# Patient Record
Sex: Female | Born: 1940 | Race: White | Hispanic: No | Smoking: Never smoker
Health system: Southern US, Community
[De-identification: ages and names within clinical notes are randomized; demographics above are authoritative.]

## PROBLEM LIST (undated history)

## (undated) DIAGNOSIS — I639 Cerebral infarction, unspecified: Secondary | ICD-10-CM

## (undated) DIAGNOSIS — F0390 Unspecified dementia without behavioral disturbance: Secondary | ICD-10-CM

## (undated) DIAGNOSIS — J189 Pneumonia, unspecified organism: Secondary | ICD-10-CM

## (undated) DIAGNOSIS — E119 Type 2 diabetes mellitus without complications: Secondary | ICD-10-CM

## (undated) DIAGNOSIS — J90 Pleural effusion, not elsewhere classified: Secondary | ICD-10-CM

## (undated) DIAGNOSIS — K219 Gastro-esophageal reflux disease without esophagitis: Secondary | ICD-10-CM

## (undated) DIAGNOSIS — I509 Heart failure, unspecified: Secondary | ICD-10-CM

## (undated) DIAGNOSIS — I2510 Atherosclerotic heart disease of native coronary artery without angina pectoris: Secondary | ICD-10-CM

## (undated) DIAGNOSIS — IMO0002 Reserved for concepts with insufficient information to code with codable children: Secondary | ICD-10-CM

## (undated) DIAGNOSIS — I480 Paroxysmal atrial fibrillation: Secondary | ICD-10-CM

## (undated) DIAGNOSIS — I69351 Hemiplegia and hemiparesis following cerebral infarction affecting right dominant side: Secondary | ICD-10-CM

## (undated) DIAGNOSIS — J449 Chronic obstructive pulmonary disease, unspecified: Secondary | ICD-10-CM

## (undated) HISTORY — PX: THORACENTESIS: SHX235

## (undated) HISTORY — PX: BREAST SURGERY: SHX581

## (undated) HISTORY — PX: OTHER SURGICAL HISTORY: SHX169

---

## 2013-04-16 ENCOUNTER — Inpatient Hospital Stay
Admission: AD | Admit: 2013-04-16 | Discharge: 2013-05-25 | Disposition: A | Discharge status: Skilled Nursing Facility | Payer: Medicare Other | Source: Ambulatory Visit | Attending: Internal Medicine | Admitting: Internal Medicine

## 2013-04-17 ENCOUNTER — Other Ambulatory Visit (HOSPITAL_COMMUNITY): Payer: Medicare Other

## 2013-04-17 ENCOUNTER — Other Ambulatory Visit (HOSPITAL_COMMUNITY): Payer: Self-pay

## 2013-04-17 LAB — COMPREHENSIVE METABOLIC PANEL
AST: 20 U/L (ref 0–37)
Albumin: 1.9 g/dL — ABNORMAL LOW (ref 3.5–5.2)
BUN: 26 mg/dL — ABNORMAL HIGH (ref 6–23)
Calcium: 8.4 mg/dL (ref 8.4–10.5)
Chloride: 105 mEq/L (ref 96–112)
Creatinine, Ser: 1.82 mg/dL — ABNORMAL HIGH (ref 0.50–1.10)
GFR calc non Af Amer: 27 mL/min — ABNORMAL LOW (ref >90)
Sodium: 137 mEq/L (ref 135–145)

## 2013-04-17 LAB — PREALBUMIN: Prealbumin: 15.5 mg/dL — ABNORMAL LOW (ref 17.0–34.0)

## 2013-04-17 LAB — CBC
HCT: 31.5 % — ABNORMAL LOW (ref 36.0–46.0)
MCH: 24.6 pg — ABNORMAL LOW (ref 26.0–34.0)
MCHC: 31.4 g/dL (ref 30.0–36.0)
MCV: 78.4 fL (ref 78.0–100.0)
Platelets: 245 10*3/uL (ref 150–400)
RDW: 20.3 % — ABNORMAL HIGH (ref 11.5–15.5)
WBC: 9.7 10*3/uL (ref 4.0–10.5)

## 2013-04-17 LAB — CALCIUM, IONIZED: Calcium, Ion: 1.23 mmol/L (ref 1.13–1.30)

## 2013-04-17 LAB — MAGNESIUM: Magnesium: 2 mg/dL (ref 1.5–2.5)

## 2013-04-17 LAB — PHOSPHORUS: Phosphorus: 5.2 mg/dL — ABNORMAL HIGH (ref 2.3–4.6)

## 2013-04-17 LAB — PROCALCITONIN: Procalcitonin: <0.1 ng/mL

## 2013-04-18 ENCOUNTER — Other Ambulatory Visit (HOSPITAL_COMMUNITY): Payer: Medicare Other

## 2013-04-18 ENCOUNTER — Other Ambulatory Visit (HOSPITAL_COMMUNITY): Payer: Self-pay

## 2013-04-18 LAB — BLOOD GAS, ARTERIAL
Bicarbonate: 22.1 mEq/L (ref 20.0–24.0)
FIO2: 1 %
O2 Saturation: 99 %
TCO2: 23.4 mmol/L (ref 0–100)
pCO2 arterial: 43.9 mmHg (ref 35.0–45.0)
pH, Arterial: 7.322 — ABNORMAL LOW (ref 7.350–7.450)
pO2, Arterial: 150 mmHg — ABNORMAL HIGH (ref 80.0–100.0)

## 2013-04-18 NOTE — Procedures (Signed)
US guided therapeutic right thoracentesis performed yielding 1.8 liters slightly turbid, yellow fluid. F/u CXR pending. No immediate complications.

## 2013-04-19 ENCOUNTER — Institutional Professional Consult (permissible substitution) (HOSPITAL_COMMUNITY): Payer: Medicare Other

## 2013-04-19 ENCOUNTER — Encounter: Payer: Self-pay | Admitting: *Deleted

## 2013-04-19 ENCOUNTER — Other Ambulatory Visit (HOSPITAL_COMMUNITY): Payer: Medicare Other

## 2013-04-19 LAB — PROTIME-INR
INR: 1.15 (ref 0.00–1.49)
Prothrombin Time: 14.5 seconds (ref 11.6–15.2)

## 2013-04-19 LAB — BASIC METABOLIC PANEL
BUN: 24 mg/dL — ABNORMAL HIGH (ref 6–23)
CO2: 23 mEq/L (ref 19–32)
Chloride: 106 mEq/L (ref 96–112)
GFR calc Af Amer: 31 mL/min — ABNORMAL LOW (ref >90)
GFR calc non Af Amer: 26 mL/min — ABNORMAL LOW (ref >90)
Potassium: 5.3 mEq/L — ABNORMAL HIGH (ref 3.5–5.1)
Sodium: 138 mEq/L (ref 135–145)

## 2013-04-19 LAB — APTT: aPTT: 31 seconds (ref 24–37)

## 2013-04-19 LAB — CBC
HCT: 33.7 % — ABNORMAL LOW (ref 36.0–46.0)
Hemoglobin: 10.8 g/dL — ABNORMAL LOW (ref 12.0–15.0)
MCH: 25.1 pg — ABNORMAL LOW (ref 26.0–34.0)
MCHC: 32 g/dL (ref 30.0–36.0)
WBC: 13.8 10*3/uL — ABNORMAL HIGH (ref 4.0–10.5)

## 2013-04-20 ENCOUNTER — Other Ambulatory Visit (HOSPITAL_COMMUNITY): Payer: Medicare Other

## 2013-04-20 ENCOUNTER — Other Ambulatory Visit (HOSPITAL_COMMUNITY): Payer: Self-pay

## 2013-04-20 LAB — BASIC METABOLIC PANEL
BUN: 24 mg/dL — ABNORMAL HIGH (ref 6–23)
Calcium: 9 mg/dL (ref 8.4–10.5)
GFR calc Af Amer: 29 mL/min — ABNORMAL LOW (ref >90)
GFR calc non Af Amer: 25 mL/min — ABNORMAL LOW (ref >90)
Glucose, Bld: 143 mg/dL — ABNORMAL HIGH (ref 70–99)
Potassium: 4.8 mEq/L (ref 3.5–5.1)

## 2013-04-20 LAB — CBC
HCT: 33.4 % — ABNORMAL LOW (ref 36.0–46.0)
Hemoglobin: 10.6 g/dL — ABNORMAL LOW (ref 12.0–15.0)
MCH: 25.1 pg — ABNORMAL LOW (ref 26.0–34.0)
MCHC: 31.7 g/dL (ref 30.0–36.0)
Platelets: 219 10*3/uL (ref 150–400)
RDW: 20.8 % — ABNORMAL HIGH (ref 11.5–15.5)

## 2013-04-20 MED ORDER — MIDAZOLAM HCL 2 MG/2ML IJ SOLN
INTRAMUSCULAR | Status: AC
  Filled 2013-04-20: qty 6

## 2013-04-20 MED ORDER — FENTANYL CITRATE 0.05 MG/ML IJ SOLN
INTRAMUSCULAR | Status: AC
  Filled 2013-04-20: qty 4

## 2013-04-20 NOTE — Procedures (Signed)
Right Aspira tunneled pleural catheter placed under [place] and fluoro amber fluid removed No complication No blood loss. See complete dictation in [facility].

## 2013-04-20 NOTE — H&P (Signed)
Chief Complaint: "Shortness of breath."  Referring Physician: Dr. Sharyon Medicus  HPI: Ariana [name] is an 72 y.o. female with shortness of breath s/p right thoracentesis 1.8 liters 04/18/2013. Patient with chest CT 04/19/13 with right pleural fluid and CXR today increased fluid. Request for image guided right pleural catheter placement. She denies any chest pain, fever or chills. She admits to shortness of breath. She has been NPO and no blood thinners have been given. She is currently on pipercillin.   Past Medical History: CHF, renal disease, CAD, MI, DM, CVA  Past Surgical History: No pertinent surgeries  Family History: No family history on file.  Social History:  has no tobacco, alcohol, and drug history on file.  Allergies: See chart.  Medications:See chart.  Please HPI for pertinent positives, otherwise complete 10 system ROS negative.  Physical Exam: BP 117/77 There is no height or weight on file to calculate BMI.  General Appearance:  Alert, cooperative, no distress  Head:  Normocephalic, without obvious abnormality, atraumatic  Neck: Supple, symmetrical, trachea midline  Lungs:   Diminished breath sounds in right base, no w/r/r, respirations unlabored without use of accessory muscles.  Chest Wall:  No tenderness or deformity  Heart:  Regular rate and rhythm, S1, S2 normal, no murmur, rub or gallop.  Abdomen:   Soft, non-tender, non distended, (+) BS  Extremities: Extremities normal, atraumatic, no cyanosis or edema  Neurologic: Normal affect, no gross deficits.   Results for orders placed during the hospital encounter of 04/16/13 (from the past 48 hour(s))  BASIC METABOLIC PANEL     Status: Abnormal   Collection Time    04/19/13  4:14 AM      Result Value Range   Sodium 138  135 - 145 mEq/L   Potassium 5.3 (*) 3.5 - 5.1 mEq/L   Chloride 106  96 - 112 mEq/L   CO2 23  19 - 32 mEq/L   Glucose, Bld 110 (*) 70 - 99 mg/dL   BUN 24 (*) 6 - 23 mg/dL   Creatinine, Ser 4.09 (*)  0.50 - 1.10 mg/dL   Calcium 9.2  8.4 - 81.1 mg/dL   GFR calc non Af Amer 26 (*) >90 mL/min   GFR calc Af Amer 31 (*) >90 mL/min   Comment: (NOTE)     The eGFR has been calculated using the CKD EPI equation.     This calculation has not been validated in all clinical situations.     eGFR's persistently <90 mL/min signify possible Chronic Kidney     Disease.  CBC     Status: Abnormal   Collection Time    04/19/13  6:15 PM      Result Value Range   WBC 13.8 (*) 4.0 - 10.5 K/uL   RBC 4.30  3.87 - 5.11 MIL/uL   Hemoglobin 10.8 (*) 12.0 - 15.0 g/dL   HCT 91.4 (*) 78.2 - 95.6 %   MCV 78.4  78.0 - 100.0 fL   MCH 25.1 (*) 26.0 - 34.0 pg   MCHC 32.0  30.0 - 36.0 g/dL   RDW 21.3 (*) 08.6 - 57.8 %   Platelets 228  150 - 400 K/uL  APTT     Status: None   Collection Time    04/19/13  6:15 PM      Result Value Range   aPTT 31  24 - 37 seconds  PROTIME-INR     Status: None   Collection Time    04/19/13  6:15 PM  Result Value Range   Prothrombin Time 14.5  11.6 - 15.2 seconds   INR 1.15  0.00 - 1.49  CBC     Status: Abnormal   Collection Time    04/20/13  5:35 AM      Result Value Range   WBC 13.7 (*) 4.0 - 10.5 K/uL   RBC 4.23  3.87 - 5.11 MIL/uL   Hemoglobin 10.6 (*) 12.0 - 15.0 g/dL   HCT 98.1 (*) 19.1 - 47.8 %   MCV 79.0  78.0 - 100.0 fL   MCH 25.1 (*) 26.0 - 34.0 pg   MCHC 31.7  30.0 - 36.0 g/dL   RDW 29.5 (*) 62.1 - 30.8 %   Platelets 219  150 - 400 K/uL  BASIC METABOLIC PANEL     Status: Abnormal   Collection Time    04/20/13  5:35 AM      Result Value Range   Sodium 138  135 - 145 mEq/L   Potassium 4.8  3.5 - 5.1 mEq/L   Chloride 103  96 - 112 mEq/L   CO2 24  19 - 32 mEq/L   Glucose, Bld 143 (*) 70 - 99 mg/dL   BUN 24 (*) 6 - 23 mg/dL   Creatinine, Ser 6.57 (*) 0.50 - 1.10 mg/dL   Calcium 9.0  8.4 - 84.6 mg/dL   GFR calc non Af Amer 25 (*) >90 mL/min   GFR calc Af Amer 29 (*) >90 mL/min   Comment: (NOTE)     The eGFR has been calculated using the CKD EPI  equation.     This calculation has not been validated in all clinical situations.     eGFR's persistently <90 mL/min signify possible Chronic Kidney     Disease.  PROTIME-INR     Status: None   Collection Time    04/20/13  5:35 AM      Result Value Range   Prothrombin Time 14.8  11.6 - 15.2 seconds   INR 1.19  0.00 - 1.49   Ct Chest Wo Contrast  04/19/2013   CLINICAL DATA:  Right-sided parapneumonic effusion and status post drainage of 1.8 L of right pleural fluid yesterday by thoracentesis.  EXAM: CT CHEST WITHOUT CONTRAST  TECHNIQUE: Multidetector CT imaging of the chest was performed following the standard protocol without IV contrast.  COMPARISON:  Earlier chest x-rays including chest x-ray this morning.  FINDINGS: Unenhanced CT shows relatively small volume of re-accumulated right pleural fluid on the right after recent thoracentesis. There is no evidence of pneumothorax. Patchy infiltrate of the right upper lobe present. A tiny effusion is present on the left. None of the pleural fluid appears to be particularly loculated and there is no evidence of associated pleural thickening.  The heart is mildly enlarged. No pericardial fluid is seen. Calcified plaque is noted in the distribution of the LAD and left circumflex coronary arteries. No visualized enlarged lymph nodes. Bony structures are unremarkable.  IMPRESSION: Some degree of partial reaccumulation of right pleural fluid is suspected. Overall volume is relatively small, especially compared to the large volume drawn off yesterday. A tiny left pleural effusion is present. There is a right upper lobe infiltrate.   Electronically Signed   By: Irish Lack M.D.   On: 04/19/2013 12:46   Dg Chest Port 1 View  04/20/2013   CLINICAL DATA:  Pleural effusion.  EXAM: PORTABLE CHEST - 1 VIEW  COMPARISON:  04/19/2013  FINDINGS: There are persistent parenchymal densities throughout the  right lung which have not significantly changed. The right pleural  effusion may have increased in size. There is persistent consolidation at the left lung base. Cardiac silhouette is grossly stable.  IMPRESSION: There may be increased right pleural fluid.  Persistent consolidation at the left lung base.  Persistent parenchymal densities throughout the right lung.   Electronically Signed   By: Richarda Overlie M.D.   On: 04/20/2013 07:40   Dg Chest Port 1 View  04/19/2013   CLINICAL DATA:  Respiratory failure.  EXAM: PORTABLE CHEST - 1 VIEW  COMPARISON:  Chest x-ray 04/18/2013.  FINDINGS: Lung volumes are low. Persistent opacities throughout the mid lower left hemithorax compatible with areas of atelectasis and/or consolidation with superimposed moderate to large left pleural effusion. Increasing diffuse hazy opacity overlying right hemithorax may reflect enlarging layering right-sided pleural effusion, however, areas of increasing atelectasis and/or consolidation throughout the right mid to lower lung are also suspected. Cephalization of the pulmonary vasculature, without definite pulmonary edema. Heart size appears mildly enlarged The patient is rotated to the left on today's exam, resulting in distortion of the mediastinal contours and reduced diagnostic sensitivity and specificity for mediastinal pathology. Atherosclerosis in the thoracic aorta.  IMPRESSION: Overall, the appearance the chest suggests interval reaccumulation of a large volume of right-sided pleural fluid when compared to yesterday's examination. The appearance of the chest is otherwise essentially unchanged, as above.   Electronically Signed   By: Trudie Reed M.D.   On: 04/19/2013 07:45   Dg Chest Port 1 View  04/18/2013   CLINICAL DATA:  Post right thoracentesis.  EXAM: PORTABLE CHEST - 1 VIEW  COMPARISON:  04/18/2013  FINDINGS: Decreasing right pleural effusion following thoracentesis. No pneumothorax. Small left pleural effusion. No confluent opacities on the right. Continued left base atelectasis or  infiltrate. Mild cardiomegaly. No acute bony abnormality.  IMPRESSION: Decreasing right effusion following thoracentesis. No significant residual effusion. No pneumothorax.  Small left effusion with continued left base atelectasis or infiltrate, stable.   Electronically Signed   By: Charlett Nose M.D.   On: 04/18/2013 16:49   US Thoracentesis Asp Pleural Space W/img Guide  04/18/2013   CLINICAL DATA:  Patient with history of right chest tube placed at outside facility which inadvertently fell out, dyspnea, recurrent right pleural effusion. Request is made for therapeutic right thoracentesis  EXAM: ULTRASOUND GUIDED THERAPEUTIC RIGHT THORACENTESIS  COMPARISON:  None.  FINDINGS: A total of approximately 1.8 liters of slightly turbid, yellow fluid was removed.  IMPRESSION: Successful ultrasound guided therapeutic right thoracentesis yielding 1.8 liters of pleural fluid. Follow-up chest x-ray pending .  Read by: Jeananne Rama ,P.A.-C.  PROCEDURE: An ultrasound guided thoracentesis was thoroughly discussed with the patient/patient's family and questions answered. The benefits, risks, alternatives and complications were also discussed. The patient/patient's family understands and wishes to proceed with the procedure. Written consent was obtained.  Ultrasound was performed to localize and mark an adequate pocket of fluid in the right chest. The area was then prepped and draped in the normal sterile fashion. 1% Lidocaine was used for local anesthesia. Under ultrasound guidance a 19 gauge Yueh catheter was introduced. Thoracentesis was performed. The catheter was removed and a dressing applied.  Complications:  None immediate   Electronically Signed   By: Irish Lack M.D.   On: 04/18/2013 15:10    Assessment/Plan Right pleural effusion s/p thoracentesis 04/18/13 Shortness of breath, CT 11/23 and CXR 11/24 with increased right pleural fluid.  Request for image guided right pleural  pigtail catheter  placement. Patient has been NPO, no thinners, on IV antibiotics, images reviewed by Dr. Deanne Coffer and labs reviewed.  Risks and Benefits discussed with the patient and the patient's family over the phone. All of the patient's questions were answered, patient is agreeable to proceed. Consent signed and in chart.    Pattricia Boss D PA-C 04/20/2013, 12:21 PM

## 2013-04-20 NOTE — ED Notes (Signed)
No sedation given due to low O2 sats

## 2013-04-21 ENCOUNTER — Other Ambulatory Visit (HOSPITAL_COMMUNITY): Payer: Medicare Other

## 2013-04-21 LAB — CBC
MCH: 24.8 pg — ABNORMAL LOW (ref 26.0–34.0)
MCV: 80.9 fL (ref 78.0–100.0)
Platelets: 212 10*3/uL (ref 150–400)
RDW: 21.3 % — ABNORMAL HIGH (ref 11.5–15.5)
WBC: 14.8 10*3/uL — ABNORMAL HIGH (ref 4.0–10.5)

## 2013-04-21 LAB — BASIC METABOLIC PANEL
BUN: 26 mg/dL — ABNORMAL HIGH (ref 6–23)
Calcium: 8.8 mg/dL (ref 8.4–10.5)
Creatinine, Ser: 2.02 mg/dL — ABNORMAL HIGH (ref 0.50–1.10)
GFR calc Af Amer: 27 mL/min — ABNORMAL LOW (ref >90)
Glucose, Bld: 106 mg/dL — ABNORMAL HIGH (ref 70–99)

## 2013-04-23 LAB — BASIC METABOLIC PANEL
BUN: 28 mg/dL — ABNORMAL HIGH (ref 6–23)
Calcium: 8.5 mg/dL (ref 8.4–10.5)
GFR calc Af Amer: 30 mL/min — ABNORMAL LOW (ref >90)
GFR calc non Af Amer: 26 mL/min — ABNORMAL LOW (ref >90)
Glucose, Bld: 59 mg/dL — ABNORMAL LOW (ref 70–99)
Sodium: 139 mEq/L (ref 135–145)

## 2013-04-23 LAB — CBC
Hemoglobin: 10.3 g/dL — ABNORMAL LOW (ref 12.0–15.0)
MCH: 24.6 pg — ABNORMAL LOW (ref 26.0–34.0)
MCHC: 31 g/dL (ref 30.0–36.0)
MCV: 79.4 fL (ref 78.0–100.0)
Platelets: 214 10*3/uL (ref 150–400)
RDW: 21.5 % — ABNORMAL HIGH (ref 11.5–15.5)

## 2013-04-25 LAB — BASIC METABOLIC PANEL
Calcium: 8.8 mg/dL (ref 8.4–10.5)
Chloride: 105 mEq/L (ref 96–112)
Creatinine, Ser: 2 mg/dL — ABNORMAL HIGH (ref 0.50–1.10)
GFR calc Af Amer: 28 mL/min — ABNORMAL LOW (ref >90)
GFR calc non Af Amer: 24 mL/min — ABNORMAL LOW (ref >90)

## 2013-04-25 LAB — TSH: TSH: 1.977 u[IU]/mL (ref 0.350–4.500)

## 2013-04-26 ENCOUNTER — Encounter: Payer: Self-pay | Admitting: *Deleted

## 2013-04-26 ENCOUNTER — Other Ambulatory Visit (HOSPITAL_COMMUNITY): Payer: Medicare Other

## 2013-04-27 ENCOUNTER — Other Ambulatory Visit (HOSPITAL_COMMUNITY): Payer: Medicare Other

## 2013-04-27 LAB — BASIC METABOLIC PANEL
BUN: 25 mg/dL — ABNORMAL HIGH (ref 6–23)
CO2: 25 mEq/L (ref 19–32)
Chloride: 104 mEq/L (ref 96–112)
Creatinine, Ser: 2.04 mg/dL — ABNORMAL HIGH (ref 0.50–1.10)
GFR calc Af Amer: 27 mL/min — ABNORMAL LOW (ref >90)
Potassium: 5.7 mEq/L — ABNORMAL HIGH (ref 3.5–5.1)
Sodium: 138 mEq/L (ref 135–145)

## 2013-04-27 LAB — CBC
Hemoglobin: 9.8 g/dL — ABNORMAL LOW (ref 12.0–15.0)
MCV: 79.5 fL (ref 78.0–100.0)
Platelets: 183 10*3/uL (ref 150–400)
RBC: 3.86 MIL/uL — ABNORMAL LOW (ref 3.87–5.11)
RDW: 22.2 % — ABNORMAL HIGH (ref 11.5–15.5)
WBC: 6.6 10*3/uL (ref 4.0–10.5)

## 2013-04-27 LAB — POTASSIUM: Potassium: 4.4 mEq/L (ref 3.5–5.1)

## 2013-04-28 LAB — APTT: aPTT: 29 seconds (ref 24–37)

## 2013-04-28 LAB — PROTIME-INR: INR: 1.34 (ref 0.00–1.49)

## 2013-04-30 ENCOUNTER — Other Ambulatory Visit (HOSPITAL_COMMUNITY): Payer: Medicare Other

## 2013-04-30 LAB — BASIC METABOLIC PANEL
BUN: 23 mg/dL (ref 6–23)
Calcium: 8.3 mg/dL — ABNORMAL LOW (ref 8.4–10.5)
Chloride: 105 mEq/L (ref 96–112)
Creatinine, Ser: 1.85 mg/dL — ABNORMAL HIGH (ref 0.50–1.10)
GFR calc Af Amer: 30 mL/min — ABNORMAL LOW (ref >90)
GFR calc non Af Amer: 26 mL/min — ABNORMAL LOW (ref >90)
Glucose, Bld: 94 mg/dL (ref 70–99)
Potassium: 5.8 mEq/L — ABNORMAL HIGH (ref 3.5–5.1)

## 2013-04-30 LAB — PROTIME-INR
INR: 5.63 (ref 0.00–1.49)
Prothrombin Time: 48.6 seconds — ABNORMAL HIGH (ref 11.6–15.2)

## 2013-04-30 LAB — CBC
HCT: 33.4 % — ABNORMAL LOW (ref 36.0–46.0)
Hemoglobin: 10.5 g/dL — ABNORMAL LOW (ref 12.0–15.0)
MCHC: 31.4 g/dL (ref 30.0–36.0)
RBC: 4.14 MIL/uL (ref 3.87–5.11)
RDW: 21.9 % — ABNORMAL HIGH (ref 11.5–15.5)

## 2013-05-01 LAB — PROTIME-INR: Prothrombin Time: 42.3 seconds — ABNORMAL HIGH (ref 11.6–15.2)

## 2013-05-02 ENCOUNTER — Other Ambulatory Visit (HOSPITAL_COMMUNITY): Payer: Medicare Other

## 2013-05-02 LAB — PROTIME-INR
INR: 3.4 — ABNORMAL HIGH (ref 0.00–1.49)
Prothrombin Time: 33.1 seconds — ABNORMAL HIGH (ref 11.6–15.2)

## 2013-05-03 ENCOUNTER — Other Ambulatory Visit (HOSPITAL_COMMUNITY): Payer: Medicare Other

## 2013-05-03 NOTE — H&P (Signed)
Ariana [name] is an 72 y.o. female.   Chief Complaint: Scheduled for replacement of Rt pleural aspira drain 12/8 Drain was originally placed 11/24 Did well with good output until 11/30 Pt apparently developed pain and decreased output and drain was removed at  bedside by [facility] MD 11/30 Pt continues to have worsening sxs of shortness of breath CXR today shows moderate R effusion; small left per Dr Archer [facility] talked with Dr Sharyon Medicus- now scheduled for replacement 12/8 INR 2.17 today--recheck in am 100% O2 sat- 4 liters Resp : 32 If pt worsens today we will do thora now; and reschedule aspira drain later in week when effusion has reaccumulated HPI: CHF; Rt pleural effusion- worsening  History reviewed. No pertinent past medical history.  No past surgical history on file.  No family history on file. Social History:  has no tobacco, alcohol, and drug history on file.  Allergies: Not on File  No prescriptions prior to admission    Results for orders placed during the hospital encounter of 04/16/13 (from the past 48 hour(s))  PROTIME-INR     Status: Abnormal   Collection Time    05/02/13  5:20 AM      Result Value Range   Prothrombin Time 33.1 (*) 11.6 - 15.2 seconds   INR 3.40 (*) 0.00 - 1.49  PROTIME-INR     Status: Abnormal   Collection Time    05/03/13  6:18 AM      Result Value Range   Prothrombin Time 23.5 (*) 11.6 - 15.2 seconds   INR 2.17 (*) 0.00 - 1.49   Dg Chest Port 1 View  05/03/2013   CLINICAL DATA:  Dyspnea, known pleural effusions.  EXAM: PORTABLE CHEST - 1 VIEW  COMPARISON:  Portable chest x-ray of May 02, 2013.  FINDINGS: The lungs appear slightly better inflated today. There is blunting of the costophrenic angles and bilaterally and there is fluid in the minor fissure consistent with stable pleural effusions. The cardiopericardial silhouette remains enlarged. The pulmonary interstitial markings remain mildly increased but appear to haveimproved  slightly since the earlier study. The PICC line on the left appears unchanged with the tip at the junction of the right and left brachiocephalic veins.  IMPRESSION: 1. The findings are consistent with congestive heart failure with pulmonary interstitial edema and bilateral pleural effusions. There may been slight interval improvement in the interstitial edema since yesterday's study.   Electronically Signed   By: David  [place]   On: 05/03/2013 08:42   Dg Chest Port 1 View  05/02/2013   CLINICAL DATA:  Pleural effusions.  EXAM: PORTABLE CHEST - 1 VIEW  COMPARISON:  Chest x-rays dated 04/30/2013 and 04/27/2013  FINDINGS: PICC in place, unchanged. Persistent cardiomegaly. Pulmonary vascularity is normal.  The pulmonary edema seen on the prior study has improved. Right pleural effusion appears slightly larger. Loculated left pleural effusion appears slightly diminished.  IMPRESSION: 1. Improved bilateral pulmonary edema. 2. Increased loculated right pleural effusion. 3. Slight improvement of loculated effusion at the left base.   Electronically Signed   By: Geanie Cooley M.D.   On: 05/02/2013 08:20    Review of Systems  Constitutional: Positive for weight loss. Negative for fever.  Respiratory: Positive for shortness of breath and wheezing.   Cardiovascular: Positive for chest pain and orthopnea.  Gastrointestinal: Negative for nausea, vomiting and abdominal pain.  Neurological: Positive for weakness.    Blood pressure 120/75, pulse 96, resp. rate 24, SpO2 96.00%. Physical Exam  Constitutional:  She is oriented to person, place, and time. She appears well-nourished.  Cardiovascular: Normal rate, regular rhythm and normal heart sounds.   Respiratory: She is in respiratory distress. She has wheezes.  O2 sat 100% 4L Resp: 32  Neurological: She is alert and oriented to person, place, and time.  Very weak  Skin: Skin is warm.  Psychiatric: She has a normal mood and affect. Her behavior is normal.   Consented son via phone     Assessment/Plan Worsening Rt pleural effusion Worsening sob Scheduled for replacement of pleural aspira drain (R) 12/8 Check INR 12/8 am If worsens today or tonight - please call IR MD  Pt and son aware of procedure benefits and risks and agreeable to proceed Consent signed andin chart Ancef ordered  Casaundra Takacs A 05/03/2013, 10:31 AM

## 2013-05-04 LAB — BASIC METABOLIC PANEL
BUN: 20 mg/dL (ref 6–23)
CO2: 28 mEq/L (ref 19–32)
Creatinine, Ser: 1.84 mg/dL — ABNORMAL HIGH (ref 0.50–1.10)
GFR calc non Af Amer: 26 mL/min — ABNORMAL LOW (ref >90)
Glucose, Bld: 100 mg/dL — ABNORMAL HIGH (ref 70–99)
Potassium: 3.7 mEq/L (ref 3.5–5.1)

## 2013-05-04 LAB — CBC
HCT: 32.2 % — ABNORMAL LOW (ref 36.0–46.0)
Hemoglobin: 9.9 g/dL — ABNORMAL LOW (ref 12.0–15.0)
MCHC: 30.7 g/dL (ref 30.0–36.0)
Platelets: 234 10*3/uL (ref 150–400)
RBC: 3.88 MIL/uL (ref 3.87–5.11)

## 2013-05-04 LAB — PROTIME-INR: INR: 1.59 — ABNORMAL HIGH (ref 0.00–1.49)

## 2013-05-04 NOTE — Progress Notes (Signed)
Dr. Fredia Sorrow has reviewed the patient's recent CXR and feels given that the patient has not had a pleural drain in place for over a week with no need for a thoracentesis, we will hold off on a pleural drain placement for now and perform therapeutic thoracentesis when needed. This was discussed with Dr. Arnette Norris today who also feels that the patient's CXR from 05/03/13 has improved.  Pattricia Boss PA-C Interventional Radiology  05/04/13  11:18 AM

## 2013-05-05 ENCOUNTER — Other Ambulatory Visit (HOSPITAL_COMMUNITY): Payer: Medicare Other

## 2013-05-05 LAB — PROTIME-INR: INR: 1.49 (ref 0.00–1.49)

## 2013-05-06 LAB — BASIC METABOLIC PANEL
BUN: 23 mg/dL (ref 6–23)
CO2: 28 mEq/L (ref 19–32)
Calcium: 8.5 mg/dL (ref 8.4–10.5)
Chloride: 104 mEq/L (ref 96–112)
Creatinine, Ser: 1.97 mg/dL — ABNORMAL HIGH (ref 0.50–1.10)
GFR calc Af Amer: 28 mL/min — ABNORMAL LOW (ref >90)
GFR calc non Af Amer: 24 mL/min — ABNORMAL LOW (ref >90)
Glucose, Bld: 72 mg/dL (ref 70–99)
Potassium: 3.9 mEq/L (ref 3.5–5.1)
Sodium: 141 mEq/L (ref 135–145)

## 2013-05-06 LAB — CBC
HCT: 34.3 % — ABNORMAL LOW (ref 36.0–46.0)
Hemoglobin: 10 g/dL — ABNORMAL LOW (ref 12.0–15.0)
MCHC: 29.2 g/dL — ABNORMAL LOW (ref 30.0–36.0)
RBC: 4.02 MIL/uL (ref 3.87–5.11)
WBC: 7.8 10*3/uL (ref 4.0–10.5)

## 2013-05-06 LAB — PROTIME-INR
INR: 2.94 — ABNORMAL HIGH (ref 0.00–1.49)
Prothrombin Time: 29.6 seconds — ABNORMAL HIGH (ref 11.6–15.2)

## 2013-05-06 LAB — PRO B NATRIURETIC PEPTIDE: Pro B Natriuretic peptide (BNP): 11763 pg/mL — ABNORMAL HIGH (ref 0–125)

## 2013-05-07 LAB — BASIC METABOLIC PANEL
BUN: 23 mg/dL (ref 6–23)
CO2: 30 mEq/L (ref 19–32)
Calcium: 8.5 mg/dL (ref 8.4–10.5)
Creatinine, Ser: 1.88 mg/dL — ABNORMAL HIGH (ref 0.50–1.10)
Glucose, Bld: 83 mg/dL (ref 70–99)

## 2013-05-07 LAB — PROTIME-INR: INR: 3.65 — ABNORMAL HIGH (ref 0.00–1.49)

## 2013-05-08 ENCOUNTER — Institutional Professional Consult (permissible substitution) (HOSPITAL_COMMUNITY): Payer: Medicare Other

## 2013-05-08 DIAGNOSIS — I359 Nonrheumatic aortic valve disorder, unspecified: Secondary | ICD-10-CM

## 2013-05-08 LAB — CBC WITH DIFFERENTIAL/PLATELET
Basophils Relative: 0 % (ref 0–1)
Eosinophils Absolute: 0.3 10*3/uL (ref 0.0–0.7)
HCT: 35.9 % — ABNORMAL LOW (ref 36.0–46.0)
Hemoglobin: 10.8 g/dL — ABNORMAL LOW (ref 12.0–15.0)
Lymphocytes Relative: 9 % — ABNORMAL LOW (ref 12–46)
Lymphs Abs: 0.8 10*3/uL (ref 0.7–4.0)
MCH: 25.1 pg — ABNORMAL LOW (ref 26.0–34.0)
MCHC: 30.1 g/dL (ref 30.0–36.0)
MCV: 83.5 fL (ref 78.0–100.0)
Monocytes Absolute: 0.8 10*3/uL (ref 0.1–1.0)
Neutro Abs: 7.4 10*3/uL (ref 1.7–7.7)
WBC: 9.3 10*3/uL (ref 4.0–10.5)

## 2013-05-08 LAB — BASIC METABOLIC PANEL
BUN: 24 mg/dL — ABNORMAL HIGH (ref 6–23)
GFR calc Af Amer: 30 mL/min — ABNORMAL LOW (ref >90)
GFR calc non Af Amer: 26 mL/min — ABNORMAL LOW (ref >90)
Glucose, Bld: 126 mg/dL — ABNORMAL HIGH (ref 70–99)
Potassium: 5 mEq/L (ref 3.5–5.1)

## 2013-05-08 NOTE — Progress Notes (Signed)
  Echocardiogram 2D Echocardiogram has been performed.  Cathie Beams 05/08/2013, 5:04 PM

## 2013-05-09 LAB — PROTIME-INR: Prothrombin Time: 22.5 seconds — ABNORMAL HIGH (ref 11.6–15.2)

## 2013-05-10 LAB — URINALYSIS, ROUTINE W REFLEX MICROSCOPIC
Glucose, UA: NEGATIVE mg/dL
Leukocytes, UA: NEGATIVE
Nitrite: NEGATIVE
Protein, ur: >300 mg/dL — AB
Specific Gravity, Urine: 1.011 (ref 1.005–1.030)
pH: 6.5 (ref 5.0–8.0)

## 2013-05-10 LAB — URINE MICROSCOPIC-ADD ON

## 2013-05-10 LAB — PROTIME-INR: Prothrombin Time: 23.3 seconds — ABNORMAL HIGH (ref 11.6–15.2)

## 2013-05-11 ENCOUNTER — Other Ambulatory Visit (HOSPITAL_COMMUNITY): Payer: Medicare Other

## 2013-05-11 LAB — BASIC METABOLIC PANEL
BUN: 30 mg/dL — ABNORMAL HIGH (ref 6–23)
CO2: 28 mEq/L (ref 19–32)
Calcium: 8.6 mg/dL (ref 8.4–10.5)
Chloride: 101 mEq/L (ref 96–112)
GFR calc Af Amer: 27 mL/min — ABNORMAL LOW (ref >90)
GFR calc non Af Amer: 23 mL/min — ABNORMAL LOW (ref >90)
Potassium: 3.8 mEq/L (ref 3.5–5.1)
Sodium: 140 mEq/L (ref 135–145)

## 2013-05-11 LAB — PROTIME-INR: Prothrombin Time: 22.5 seconds — ABNORMAL HIGH (ref 11.6–15.2)

## 2013-05-12 LAB — CBC
Hemoglobin: 15.1 g/dL — ABNORMAL HIGH (ref 12.0–15.0)
MCHC: 30.9 g/dL (ref 30.0–36.0)
Platelets: 226 10*3/uL (ref 150–400)
RDW: 21.4 % — ABNORMAL HIGH (ref 11.5–15.5)

## 2013-05-12 LAB — BASIC METABOLIC PANEL
Calcium: 8.6 mg/dL (ref 8.4–10.5)
GFR calc Af Amer: 27 mL/min — ABNORMAL LOW (ref >90)
GFR calc non Af Amer: 23 mL/min — ABNORMAL LOW (ref >90)
Glucose, Bld: 110 mg/dL — ABNORMAL HIGH (ref 70–99)
Potassium: 4.6 mEq/L (ref 3.5–5.1)
Sodium: 135 mEq/L (ref 135–145)

## 2013-05-12 LAB — METHYLMALONIC ACID, SERUM: Methylmalonic Acid, Quantitative: 0.53 umol/L — ABNORMAL HIGH (ref <0.40)

## 2013-05-12 LAB — PROTIME-INR
INR: 2.2 — ABNORMAL HIGH (ref 0.00–1.49)
Prothrombin Time: 23.7 seconds — ABNORMAL HIGH (ref 11.6–15.2)

## 2013-05-13 LAB — PROTIME-INR
INR: 2.38 — ABNORMAL HIGH (ref 0.00–1.49)
Prothrombin Time: 25.2 seconds — ABNORMAL HIGH (ref 11.6–15.2)

## 2013-05-13 LAB — BASIC METABOLIC PANEL
CO2: 24 mEq/L (ref 19–32)
Calcium: 8.6 mg/dL (ref 8.4–10.5)
Creatinine, Ser: 1.94 mg/dL — ABNORMAL HIGH (ref 0.50–1.10)
GFR calc non Af Amer: 25 mL/min — ABNORMAL LOW (ref >90)
Potassium: 4.8 mEq/L (ref 3.5–5.1)
Sodium: 136 mEq/L (ref 135–145)

## 2013-05-14 LAB — CBC
MCH: 25.1 pg — ABNORMAL LOW (ref 26.0–34.0)
MCHC: 29.4 g/dL — ABNORMAL LOW (ref 30.0–36.0)
RBC: 4.03 MIL/uL (ref 3.87–5.11)
RDW: 20.6 % — ABNORMAL HIGH (ref 11.5–15.5)

## 2013-05-14 LAB — PROTIME-INR
INR: 1.98 — ABNORMAL HIGH (ref 0.00–1.49)
Prothrombin Time: 21.9 seconds — ABNORMAL HIGH (ref 11.6–15.2)

## 2013-05-15 ENCOUNTER — Other Ambulatory Visit (HOSPITAL_COMMUNITY): Payer: Medicare Other

## 2013-05-15 LAB — BASIC METABOLIC PANEL
BUN: 33 mg/dL — ABNORMAL HIGH (ref 6–23)
CO2: 26 mEq/L (ref 19–32)
Calcium: 8.4 mg/dL (ref 8.4–10.5)
GFR calc Af Amer: 26 mL/min — ABNORMAL LOW (ref >90)
GFR calc non Af Amer: 22 mL/min — ABNORMAL LOW (ref >90)
Glucose, Bld: 100 mg/dL — ABNORMAL HIGH (ref 70–99)
Potassium: 4.7 mEq/L (ref 3.5–5.1)
Sodium: 136 mEq/L (ref 135–145)

## 2013-05-15 LAB — URINE CULTURE

## 2013-05-15 LAB — PROTIME-INR: INR: 1.52 — ABNORMAL HIGH (ref 0.00–1.49)

## 2013-05-16 LAB — PROTIME-INR: Prothrombin Time: 17.7 seconds — ABNORMAL HIGH (ref 11.6–15.2)

## 2013-05-17 LAB — PROTIME-INR: Prothrombin Time: 20.4 seconds — ABNORMAL HIGH (ref 11.6–15.2)

## 2013-05-18 ENCOUNTER — Other Ambulatory Visit (HOSPITAL_COMMUNITY): Payer: Medicare Other

## 2013-05-18 LAB — BASIC METABOLIC PANEL
BUN: 27 mg/dL — ABNORMAL HIGH (ref 6–23)
GFR calc Af Amer: 26 mL/min — ABNORMAL LOW (ref >90)
GFR calc non Af Amer: 23 mL/min — ABNORMAL LOW (ref >90)
Potassium: 4.6 mEq/L (ref 3.5–5.1)
Sodium: 137 mEq/L (ref 135–145)

## 2013-05-18 LAB — CBC
MCHC: 30.8 g/dL (ref 30.0–36.0)
RBC: 3.9 MIL/uL (ref 3.87–5.11)
RDW: 20.6 % — ABNORMAL HIGH (ref 11.5–15.5)

## 2013-05-18 LAB — PROTIME-INR
INR: 2.83 — ABNORMAL HIGH (ref 0.00–1.49)
Prothrombin Time: 28.8 seconds — ABNORMAL HIGH (ref 11.6–15.2)

## 2013-05-19 LAB — URINE MICROSCOPIC-ADD ON

## 2013-05-19 LAB — URINALYSIS, ROUTINE W REFLEX MICROSCOPIC
Bilirubin Urine: NEGATIVE
Glucose, UA: 100 mg/dL — AB
Ketones, ur: NEGATIVE mg/dL
Protein, ur: >300 mg/dL — AB
Urobilinogen, UA: 0.2 mg/dL (ref 0.0–1.0)

## 2013-05-19 LAB — PROTIME-INR
INR: 3.47 — ABNORMAL HIGH (ref 0.00–1.49)
Prothrombin Time: 33.6 seconds — ABNORMAL HIGH (ref 11.6–15.2)

## 2013-05-20 ENCOUNTER — Other Ambulatory Visit (HOSPITAL_COMMUNITY): Payer: Medicare Other

## 2013-05-20 LAB — CBC
HCT: 32.7 % — ABNORMAL LOW (ref 36.0–46.0)
MCH: 26 pg (ref 26.0–34.0)
MCHC: 31.2 g/dL (ref 30.0–36.0)
RDW: 20.9 % — ABNORMAL HIGH (ref 11.5–15.5)
WBC: 6.7 10*3/uL (ref 4.0–10.5)

## 2013-05-20 LAB — BASIC METABOLIC PANEL
BUN: 26 mg/dL — ABNORMAL HIGH (ref 6–23)
Calcium: 8.4 mg/dL (ref 8.4–10.5)
Chloride: 107 mEq/L (ref 96–112)
Creatinine, Ser: 2.16 mg/dL — ABNORMAL HIGH (ref 0.50–1.10)
GFR calc Af Amer: 25 mL/min — ABNORMAL LOW (ref >90)
Glucose, Bld: 91 mg/dL (ref 70–99)

## 2013-05-20 LAB — PROTIME-INR: Prothrombin Time: 28.2 seconds — ABNORMAL HIGH (ref 11.6–15.2)

## 2013-05-23 LAB — PROTIME-INR: INR: 1.69 — ABNORMAL HIGH (ref 0.00–1.49)

## 2013-05-24 LAB — CBC
MCH: 25.9 pg — ABNORMAL LOW (ref 26.0–34.0)
MCV: 83 fL (ref 78.0–100.0)
Platelets: 176 10*3/uL (ref 150–400)
RBC: 3.82 MIL/uL — ABNORMAL LOW (ref 3.87–5.11)
RDW: 20.5 % — ABNORMAL HIGH (ref 11.5–15.5)
WBC: 6.7 10*3/uL (ref 4.0–10.5)

## 2013-05-24 LAB — BASIC METABOLIC PANEL
Calcium: 8.2 mg/dL — ABNORMAL LOW (ref 8.4–10.5)
Creatinine, Ser: 1.88 mg/dL — ABNORMAL HIGH (ref 0.50–1.10)
GFR calc non Af Amer: 26 mL/min — ABNORMAL LOW (ref >90)
Glucose, Bld: 107 mg/dL — ABNORMAL HIGH (ref 70–99)
Sodium: 135 mEq/L (ref 135–145)

## 2013-05-24 LAB — PROTIME-INR: INR: 1.83 — ABNORMAL HIGH (ref 0.00–1.49)

## 2013-05-25 LAB — PROTIME-INR: Prothrombin Time: 23 seconds — ABNORMAL HIGH (ref 11.6–15.2)

## 2013-05-30 ENCOUNTER — Encounter (HOSPITAL_COMMUNITY): Payer: Self-pay | Admitting: Emergency Medicine

## 2013-05-30 ENCOUNTER — Inpatient Hospital Stay (HOSPITAL_COMMUNITY): Payer: Medicare Other

## 2013-05-30 ENCOUNTER — Inpatient Hospital Stay (HOSPITAL_COMMUNITY)
Admission: EM | Admit: 2013-05-30 | Discharge: 2013-06-03 | DRG: 291 | Disposition: A | Discharge status: Skilled Nursing Facility | Payer: Medicare Other | Attending: Internal Medicine | Admitting: Internal Medicine

## 2013-05-30 ENCOUNTER — Emergency Department (HOSPITAL_COMMUNITY): Payer: Medicare Other

## 2013-05-30 DIAGNOSIS — Z7901 Long term (current) use of anticoagulants: Secondary | ICD-10-CM

## 2013-05-30 DIAGNOSIS — Z7982 Long term (current) use of aspirin: Secondary | ICD-10-CM

## 2013-05-30 DIAGNOSIS — F0390 Unspecified dementia without behavioral disturbance: Secondary | ICD-10-CM

## 2013-05-30 DIAGNOSIS — J90 Pleural effusion, not elsewhere classified: Secondary | ICD-10-CM

## 2013-05-30 DIAGNOSIS — I69959 Hemiplegia and hemiparesis following unspecified cerebrovascular disease affecting unspecified side: Secondary | ICD-10-CM

## 2013-05-30 DIAGNOSIS — I5031 Acute diastolic (congestive) heart failure: Secondary | ICD-10-CM

## 2013-05-30 DIAGNOSIS — R0600 Dyspnea, unspecified: Secondary | ICD-10-CM | POA: Diagnosis present

## 2013-05-30 DIAGNOSIS — I509 Heart failure, unspecified: Secondary | ICD-10-CM | POA: Diagnosis present

## 2013-05-30 DIAGNOSIS — J449 Chronic obstructive pulmonary disease, unspecified: Secondary | ICD-10-CM | POA: Diagnosis present

## 2013-05-30 DIAGNOSIS — D649 Anemia, unspecified: Secondary | ICD-10-CM | POA: Diagnosis present

## 2013-05-30 DIAGNOSIS — N183 Chronic kidney disease, stage 3 unspecified: Secondary | ICD-10-CM | POA: Diagnosis present

## 2013-05-30 DIAGNOSIS — J9600 Acute respiratory failure, unspecified whether with hypoxia or hypercapnia: Secondary | ICD-10-CM

## 2013-05-30 DIAGNOSIS — I2510 Atherosclerotic heart disease of native coronary artery without angina pectoris: Secondary | ICD-10-CM | POA: Diagnosis present

## 2013-05-30 DIAGNOSIS — I69351 Hemiplegia and hemiparesis following cerebral infarction affecting right dominant side: Secondary | ICD-10-CM | POA: Insufficient documentation

## 2013-05-30 DIAGNOSIS — J4489 Other specified chronic obstructive pulmonary disease: Secondary | ICD-10-CM | POA: Diagnosis present

## 2013-05-30 DIAGNOSIS — IMO0002 Reserved for concepts with insufficient information to code with codable children: Secondary | ICD-10-CM | POA: Insufficient documentation

## 2013-05-30 DIAGNOSIS — J9601 Acute respiratory failure with hypoxia: Secondary | ICD-10-CM | POA: Diagnosis present

## 2013-05-30 DIAGNOSIS — K219 Gastro-esophageal reflux disease without esophagitis: Secondary | ICD-10-CM | POA: Diagnosis present

## 2013-05-30 DIAGNOSIS — Z794 Long term (current) use of insulin: Secondary | ICD-10-CM

## 2013-05-30 DIAGNOSIS — I4891 Unspecified atrial fibrillation: Secondary | ICD-10-CM | POA: Diagnosis present

## 2013-05-30 DIAGNOSIS — J9620 Acute and chronic respiratory failure, unspecified whether with hypoxia or hypercapnia: Secondary | ICD-10-CM | POA: Diagnosis present

## 2013-05-30 DIAGNOSIS — J189 Pneumonia, unspecified organism: Secondary | ICD-10-CM | POA: Insufficient documentation

## 2013-05-30 DIAGNOSIS — I5033 Acute on chronic diastolic (congestive) heart failure: Principal | ICD-10-CM | POA: Diagnosis present

## 2013-05-30 DIAGNOSIS — E119 Type 2 diabetes mellitus without complications: Secondary | ICD-10-CM | POA: Diagnosis present

## 2013-05-30 DIAGNOSIS — Z22322 Carrier or suspected carrier of Methicillin resistant Staphylococcus aureus: Secondary | ICD-10-CM

## 2013-05-30 DIAGNOSIS — I480 Paroxysmal atrial fibrillation: Secondary | ICD-10-CM

## 2013-05-30 DIAGNOSIS — Z9981 Dependence on supplemental oxygen: Secondary | ICD-10-CM

## 2013-05-30 DIAGNOSIS — Z23 Encounter for immunization: Secondary | ICD-10-CM

## 2013-05-30 DIAGNOSIS — Z79899 Other long term (current) drug therapy: Secondary | ICD-10-CM

## 2013-05-30 DIAGNOSIS — I69922 Dysarthria following unspecified cerebrovascular disease: Secondary | ICD-10-CM

## 2013-05-30 HISTORY — DX: Paroxysmal atrial fibrillation: I48.0

## 2013-05-30 HISTORY — DX: Atherosclerotic heart disease of native coronary artery without angina pectoris: I25.10

## 2013-05-30 HISTORY — DX: Gastro-esophageal reflux disease without esophagitis: K21.9

## 2013-05-30 HISTORY — DX: Unspecified dementia, unspecified severity, without behavioral disturbance, psychotic disturbance, mood disturbance, and anxiety: F03.90

## 2013-05-30 HISTORY — DX: Heart failure, unspecified: I50.9

## 2013-05-30 HISTORY — DX: Hemiplegia and hemiparesis following cerebral infarction affecting right dominant side: I69.351

## 2013-05-30 HISTORY — DX: Type 2 diabetes mellitus without complications: E11.9

## 2013-05-30 HISTORY — DX: Pleural effusion, not elsewhere classified: J90

## 2013-05-30 HISTORY — DX: Chronic obstructive pulmonary disease, unspecified: J44.9

## 2013-05-30 HISTORY — DX: Reserved for concepts with insufficient information to code with codable children: IMO0002

## 2013-05-30 HISTORY — DX: Pneumonia, unspecified organism: J18.9

## 2013-05-30 HISTORY — DX: Cerebral infarction, unspecified: I63.9

## 2013-05-30 LAB — POCT I-STAT 3, ART BLOOD GAS (G3+)
Acid-base deficit: 6 mmol/L — ABNORMAL HIGH (ref 0.0–2.0)
Bicarbonate: 19.3 mEq/L — ABNORMAL LOW (ref 20.0–24.0)
O2 Saturation: 96 %
Patient temperature: 97.3
TCO2: 20 mmol/L (ref 0–100)
pCO2 arterial: 35.4 mmHg (ref 35.0–45.0)
pH, Arterial: 7.341 — ABNORMAL LOW (ref 7.350–7.450)
pO2, Arterial: 83 mmHg (ref 80.0–100.0)

## 2013-05-30 LAB — HEPATIC FUNCTION PANEL
ALBUMIN: 2.2 g/dL — AB (ref 3.5–5.2)
ALK PHOS: 73 U/L (ref 39–117)
ALT: 17 U/L (ref 0–35)
AST: 32 U/L (ref 0–37)
BILIRUBIN TOTAL: 0.3 mg/dL (ref 0.3–1.2)
Bilirubin, Direct: <0.2 mg/dL (ref 0.0–0.3)
Total Protein: 7.4 g/dL (ref 6.0–8.3)

## 2013-05-30 LAB — TROPONIN I
Troponin I: <0.3 ng/mL (ref <0.30)
Troponin I: <0.3 ng/mL (ref <0.30)

## 2013-05-30 LAB — BASIC METABOLIC PANEL
BUN: 22 mg/dL (ref 6–23)
CO2: 20 mEq/L (ref 19–32)
Calcium: 8.5 mg/dL (ref 8.4–10.5)
Chloride: 105 mEq/L (ref 96–112)
Creatinine, Ser: 1.66 mg/dL — ABNORMAL HIGH (ref 0.50–1.10)
GFR calc Af Amer: 35 mL/min — ABNORMAL LOW (ref >90)
GFR calc non Af Amer: 30 mL/min — ABNORMAL LOW (ref >90)
Glucose, Bld: 75 mg/dL (ref 70–99)
Potassium: 4.5 mEq/L (ref 3.7–5.3)
Sodium: 136 mEq/L — ABNORMAL LOW (ref 137–147)

## 2013-05-30 LAB — CBC WITH DIFFERENTIAL/PLATELET
Basophils Absolute: 0 10*3/uL (ref 0.0–0.1)
Basophils Relative: 0 % (ref 0–1)
Eosinophils Absolute: 0.1 10*3/uL (ref 0.0–0.7)
Eosinophils Relative: 1 % (ref 0–5)
HCT: 32.9 % — ABNORMAL LOW (ref 36.0–46.0)
Hemoglobin: 10.3 g/dL — ABNORMAL LOW (ref 12.0–15.0)
Lymphocytes Relative: 18 % (ref 12–46)
Lymphs Abs: 1.2 10*3/uL (ref 0.7–4.0)
MCH: 26 pg (ref 26.0–34.0)
MCHC: 31.3 g/dL (ref 30.0–36.0)
MCV: 83.1 fL (ref 78.0–100.0)
Monocytes Absolute: 0.6 10*3/uL (ref 0.1–1.0)
Monocytes Relative: 9 % (ref 3–12)
Neutro Abs: 4.8 10*3/uL (ref 1.7–7.7)
Neutrophils Relative %: 71 % (ref 43–77)
Platelets: 203 10*3/uL (ref 150–400)
RBC: 3.96 MIL/uL (ref 3.87–5.11)
RDW: 19.3 % — ABNORMAL HIGH (ref 11.5–15.5)
WBC: 6.8 10*3/uL (ref 4.0–10.5)

## 2013-05-30 LAB — CBC
HCT: 33.7 % — ABNORMAL LOW (ref 36.0–46.0)
Hemoglobin: 10.6 g/dL — ABNORMAL LOW (ref 12.0–15.0)
MCH: 26.2 pg (ref 26.0–34.0)
MCHC: 31.5 g/dL (ref 30.0–36.0)
MCV: 83.2 fL (ref 78.0–100.0)
Platelets: 235 10*3/uL (ref 150–400)
RBC: 4.05 MIL/uL (ref 3.87–5.11)
RDW: 19.4 % — ABNORMAL HIGH (ref 11.5–15.5)
WBC: 6.9 10*3/uL (ref 4.0–10.5)

## 2013-05-30 LAB — MRSA PCR SCREENING: MRSA BY PCR: POSITIVE — AB

## 2013-05-30 LAB — GLUCOSE, CAPILLARY
Glucose-Capillary: 101 mg/dL — ABNORMAL HIGH (ref 70–99)
Glucose-Capillary: 92 mg/dL (ref 70–99)

## 2013-05-30 LAB — MAGNESIUM: MAGNESIUM: 2 mg/dL (ref 1.5–2.5)

## 2013-05-30 LAB — PROTIME-INR
INR: 1.43 (ref 0.00–1.49)
Prothrombin Time: 17.1 seconds — ABNORMAL HIGH (ref 11.6–15.2)

## 2013-05-30 MED ORDER — VANCOMYCIN HCL IN DEXTROSE 1-5 GM/200ML-% IV SOLN: 1000.0000 mg | Freq: Once | INTRAVENOUS | Status: DC

## 2013-05-30 MED ORDER — ACETAMINOPHEN 650 MG RE SUPP: 650.0000 mg | Freq: Four times a day (QID) | RECTAL | Status: DC | PRN

## 2013-05-30 MED ORDER — FUROSEMIDE 10 MG/ML IJ SOLN
40.0000 mg | Freq: Once | INTRAMUSCULAR | Status: AC
Start: 2013-05-30 — End: 2013-05-30
  Administered 2013-05-30: 40 mg via INTRAVENOUS
  Filled 2013-05-30: qty 4

## 2013-05-30 MED ORDER — ATORVASTATIN CALCIUM 20 MG PO TABS
20.0000 mg | ORAL_TABLET | Freq: Every day | ORAL | Status: DC
  Filled 2013-05-30: qty 1

## 2013-05-30 MED ORDER — ACETAMINOPHEN 325 MG PO TABS
650.0000 mg | ORAL_TABLET | Freq: Four times a day (QID) | ORAL | Status: DC | PRN
  Administered 2013-06-01 – 2013-06-02 (×3): 650 mg via ORAL
  Filled 2013-05-30 (×3): qty 2

## 2013-05-30 MED ORDER — PIPERACILLIN-TAZOBACTAM 3.375 G IVPB: 3.3750 g | Freq: Three times a day (TID) | INTRAVENOUS | Status: DC

## 2013-05-30 MED ORDER — LEVALBUTEROL HCL 0.63 MG/3ML IN NEBU
0.6300 mg | INHALATION_SOLUTION | Freq: Four times a day (QID) | RESPIRATORY_TRACT | Status: DC
  Filled 2013-05-30 (×3): qty 3

## 2013-05-30 MED ORDER — VANCOMYCIN HCL IN DEXTROSE 750-5 MG/150ML-% IV SOLN: 750.0000 mg | INTRAVENOUS | Status: DC

## 2013-05-30 MED ORDER — DIPHENHYDRAMINE HCL 50 MG/ML IJ SOLN
INTRAMUSCULAR | Status: AC
  Filled 2013-05-30: qty 1

## 2013-05-30 MED ORDER — ALBUTEROL SULFATE (2.5 MG/3ML) 0.083% IN NEBU: 5.0000 mg | INHALATION_SOLUTION | Freq: Once | RESPIRATORY_TRACT | Status: DC

## 2013-05-30 MED ORDER — METHYLPREDNISOLONE SODIUM SUCC 125 MG IJ SOLR
INTRAMUSCULAR | Status: AC
  Filled 2013-05-30: qty 2

## 2013-05-30 MED ORDER — FAMOTIDINE IN NACL 20-0.9 MG/50ML-% IV SOLN
20.0000 mg | Freq: Once | INTRAVENOUS | Status: AC
  Administered 2013-05-30: 20 mg via INTRAVENOUS
  Filled 2013-05-30: qty 50

## 2013-05-30 MED ORDER — INSULIN ASPART 100 UNIT/ML ~~LOC~~ SOLN
0.0000 [IU] | Freq: Three times a day (TID) | SUBCUTANEOUS | Status: DC
  Administered 2013-05-31: 2 [IU] via SUBCUTANEOUS
  Administered 2013-05-31: 1 [IU] via SUBCUTANEOUS
  Administered 2013-06-01: 2 [IU] via SUBCUTANEOUS
  Administered 2013-06-02 – 2013-06-03 (×2): 1 [IU] via SUBCUTANEOUS

## 2013-05-30 MED ORDER — ASPIRIN 81 MG PO CHEW
81.0000 mg | CHEWABLE_TABLET | Freq: Every day | ORAL | Status: DC
Start: 2013-05-30 — End: 2013-05-30

## 2013-05-30 MED ORDER — INSULIN ASPART 100 UNIT/ML ~~LOC~~ SOLN: 0.0000 [IU] | Freq: Three times a day (TID) | SUBCUTANEOUS | Status: DC

## 2013-05-30 MED ORDER — MUPIROCIN 2 % EX OINT
1.0000 "application " | TOPICAL_OINTMENT | Freq: Two times a day (BID) | CUTANEOUS | Status: DC
  Administered 2013-05-31 – 2013-06-03 (×8): 1 via NASAL
  Filled 2013-05-30 (×3): qty 22

## 2013-05-30 MED ORDER — PIPERACILLIN-TAZOBACTAM 3.375 G IVPB 30 MIN
3.3750 g | Freq: Once | INTRAVENOUS | Status: AC
  Administered 2013-05-30: 3.375 g via INTRAVENOUS
  Filled 2013-05-30: qty 50

## 2013-05-30 MED ORDER — NITROGLYCERIN IN D5W 200-5 MCG/ML-% IV SOLN: 2.0000 ug/min | Freq: Once | INTRAVENOUS | Status: DC

## 2013-05-30 MED ORDER — CLONAZEPAM 0.5 MG PO TABS
0.5000 mg | ORAL_TABLET | Freq: Two times a day (BID) | ORAL | Status: DC
  Administered 2013-05-31 – 2013-06-03 (×7): 0.5 mg via ORAL
  Filled 2013-05-30 (×7): qty 1

## 2013-05-30 MED ORDER — METHYLPREDNISOLONE SODIUM SUCC 125 MG IJ SOLR: 125.0000 mg | INTRAMUSCULAR | Status: AC

## 2013-05-30 MED ORDER — ASPIRIN 81 MG PO CHEW
81.0000 mg | CHEWABLE_TABLET | Freq: Every day | ORAL | Status: DC
  Administered 2013-05-31 – 2013-06-03 (×4): 81 mg via ORAL
  Filled 2013-05-30 (×4): qty 1

## 2013-05-30 MED ORDER — CHLORHEXIDINE GLUCONATE CLOTH 2 % EX PADS
6.0000 | MEDICATED_PAD | Freq: Every day | CUTANEOUS | Status: DC
  Administered 2013-05-31 – 2013-06-03 (×3): 6 via TOPICAL

## 2013-05-30 MED ORDER — ONDANSETRON HCL 4 MG/2ML IJ SOLN
4.0000 mg | Freq: Once | INTRAMUSCULAR | Status: AC
  Administered 2013-05-30: 4 mg via INTRAVENOUS

## 2013-05-30 MED ORDER — LEVALBUTEROL HCL 0.63 MG/3ML IN NEBU
0.6300 mg | INHALATION_SOLUTION | Freq: Four times a day (QID) | RESPIRATORY_TRACT | Status: DC
  Administered 2013-05-30: 0.63 mg via RESPIRATORY_TRACT
  Filled 2013-05-30 (×3): qty 3

## 2013-05-30 MED ORDER — ONDANSETRON HCL 4 MG PO TABS: 4.0000 mg | ORAL_TABLET | Freq: Four times a day (QID) | ORAL | Status: DC | PRN

## 2013-05-30 MED ORDER — POTASSIUM CHLORIDE CRYS ER 10 MEQ PO TBCR: 10.0000 meq | EXTENDED_RELEASE_TABLET | Freq: Every day | ORAL | Status: DC

## 2013-05-30 MED ORDER — ENOXAPARIN SODIUM 30 MG/0.3ML ~~LOC~~ SOLN
30.0000 mg | SUBCUTANEOUS | Status: DC
  Administered 2013-05-30 – 2013-06-01 (×3): 30 mg via SUBCUTANEOUS
  Filled 2013-05-30 (×5): qty 0.3

## 2013-05-30 MED ORDER — METOPROLOL TARTRATE 12.5 MG HALF TABLET
12.5000 mg | ORAL_TABLET | Freq: Two times a day (BID) | ORAL | Status: DC
  Administered 2013-05-31 – 2013-06-03 (×6): 12.5 mg via ORAL
  Filled 2013-05-30 (×9): qty 1

## 2013-05-30 MED ORDER — WARFARIN - PHARMACIST DOSING INPATIENT
Freq: Every day | Status: DC
  Administered 2013-06-01: 18:00:00

## 2013-05-30 MED ORDER — SODIUM CHLORIDE 0.9 % IJ SOLN
3.0000 mL | Freq: Two times a day (BID) | INTRAMUSCULAR | Status: DC
  Administered 2013-05-30 – 2013-06-01 (×3): 3 mL via INTRAVENOUS

## 2013-05-30 MED ORDER — ONDANSETRON HCL 4 MG/2ML IJ SOLN
4.0000 mg | Freq: Four times a day (QID) | INTRAMUSCULAR | Status: DC | PRN
  Filled 2013-05-30: qty 2

## 2013-05-30 MED ORDER — POTASSIUM CHLORIDE CRYS ER 10 MEQ PO TBCR
10.0000 meq | EXTENDED_RELEASE_TABLET | Freq: Every day | ORAL | Status: DC
  Administered 2013-05-31 – 2013-06-01 (×2): 10 meq via ORAL
  Filled 2013-05-30 (×2): qty 1

## 2013-05-30 MED ORDER — WARFARIN SODIUM 2 MG PO TABS
2.0000 mg | ORAL_TABLET | Freq: Once | ORAL | Status: DC
  Filled 2013-05-30: qty 1

## 2013-05-30 MED ORDER — FUROSEMIDE 10 MG/ML IJ SOLN
20.0000 mg | Freq: Two times a day (BID) | INTRAMUSCULAR | Status: DC
  Filled 2013-05-30: qty 2

## 2013-05-30 MED ORDER — ONDANSETRON HCL 4 MG/2ML IJ SOLN
INTRAMUSCULAR | Status: AC
  Filled 2013-05-30: qty 2

## 2013-05-30 MED ORDER — METHYLPREDNISOLONE SODIUM SUCC 125 MG IJ SOLR: 125.0000 mg | INTRAMUSCULAR | Status: DC

## 2013-05-30 MED ORDER — SODIUM CHLORIDE 0.9 % IV SOLN
250.0000 mg | Freq: Four times a day (QID) | INTRAVENOUS | Status: DC
  Administered 2013-05-31 (×2): 250 mg via INTRAVENOUS
  Filled 2013-05-30 (×4): qty 250

## 2013-05-30 MED ORDER — DIPHENHYDRAMINE HCL 50 MG/ML IJ SOLN
25.0000 mg | Freq: Once | INTRAMUSCULAR | Status: AC
  Administered 2013-05-30: 25 mg via INTRAVENOUS

## 2013-05-30 MED ORDER — IPRATROPIUM BROMIDE 0.02 % IN SOLN
0.5000 mg | Freq: Four times a day (QID) | RESPIRATORY_TRACT | Status: DC
  Administered 2013-05-30: 0.5 mg via RESPIRATORY_TRACT
  Filled 2013-05-30: qty 2.5

## 2013-05-30 MED ORDER — SODIUM CHLORIDE 0.9 % IJ SOLN: 3.0000 mL | INTRAMUSCULAR | Status: DC | PRN

## 2013-05-30 MED ORDER — FUROSEMIDE 10 MG/ML IJ SOLN
20.0000 mg | Freq: Two times a day (BID) | INTRAMUSCULAR | Status: DC
  Administered 2013-05-31: 20 mg via INTRAVENOUS
  Filled 2013-05-30 (×4): qty 2

## 2013-05-30 MED ORDER — SODIUM CHLORIDE 0.9 % IV SOLN: 250.0000 mL | INTRAVENOUS | Status: DC | PRN

## 2013-05-30 MED ORDER — OSELTAMIVIR PHOSPHATE 75 MG PO CAPS
75.0000 mg | ORAL_CAPSULE | Freq: Every day | ORAL | Status: DC
  Administered 2013-05-31: 75 mg via ORAL
  Filled 2013-05-30 (×2): qty 1

## 2013-05-30 NOTE — ED Notes (Signed)
From [facility] - pt c/o SOB since yesterday worsening this morning Breath sounds very diminished right side. POX 100% on 15L NRB

## 2013-05-30 NOTE — ED Provider Notes (Signed)
CSN: [id number]     Arrival date & time 05/30/13  1138 History   First MD Initiated Contact with Patient 05/30/13 1205     Chief Complaint  Patient presents with  . Shortness of Breath   (Consider location/radiation/quality/duration/timing/severity/associated sxs/prior Treatment) HPI  73 year old female sent for evaluation for shortness of breath. She is coming from [facility] nursing home. Patient was recently changed her there from [facility]. Prior to this she was admitted at facility in [place], [place]. Admitted 11/12 for pneumonia and R pleural effusion. Apparently had a thoracentesis which showed exudative effusion, but specifics not available. She subsequently had tube thoracostomy after re accumulated and transferred to [facility] on 11/20. Refer to [facility] DC summary for more information.  Transferred to ED today because of increased WOB and hypoxemia. Pt is complaining of R sided CP at site of previous thoracostomy which is unchanged. Increasing dyspnea. Worsening over past day. Has baseline requirement of 2-3 L via [place]. Per pt, swelling in her legs appears about the same as it normally does. 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 Pt can answer simple questions but unable to relay specifics about her prior history or medical care. She is at her baseline mental status per her son.   Past Medical History  Diagnosis Date  . Pneumonia   . Pleural effusion   . CHF (congestive heart failure)   . Paroxysmal atrial fibrillation   . COPD (chronic obstructive pulmonary disease)   . Stroke   . Dysarthria due to cerebrovascular accident   . 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 Coronary artery disease   . Diabetes mellitus without complication   . GERD (gastroesophageal reflux disease)   . Dementia    Past Surgical History  Procedure Laterality Date  . Thoracostomy    . Thoracentesis     No family history on file. History  Substance Use  Topics  . Smoking status: Never Smoker   . Smokeless tobacco: Not on file  . Alcohol Use: No   OB History   Grav Para Term Preterm Abortions TAB SAB Ect Mult Living                 Review of Systems  All systems reviewed and negative, other than as noted in HPI.   Allergies  Baclofen; Clindamycin/lincomycin; and Vancomycin  Home Medications   Current Outpatient Rx  Name  Route  Sig  Dispense  Refill  . albuterol (PROVENTIL) (2.5 MG/3ML) 0.083% nebulizer solution   Nebulization   Take 2.5 mg by nebulization every 6 (six) hours as needed for wheezing or shortness of breath.         Marland Kitchen aspirin (ASPIRIN ADULT LOW STRENGTH) 81 MG chewable tablet   Oral   Chew 81 mg by mouth daily.         Marland Kitchen atorvastatin (LIPITOR) 20 MG tablet   Oral   Take 20 mg by mouth at bedtime.         . clonazePAM (KLONOPIN) 0.5 MG tablet   Oral   Take 0.5 mg by mouth 2 (two) times daily.         Marland Kitchen docusate sodium (COLACE) 100 MG capsule   Oral   Take 100 mg by mouth 2 (two) times daily.         . famotidine (PEPCID) 20 MG tablet   Oral   Take 20 mg by mouth 2 (two) times daily.         Marland Kitchen  furosemide (LASIX) 40 MG tablet   Oral   Take 40 mg by mouth daily.         . Insulin Detemir (LEVEMIR FLEXTOUCH) 100 UNIT/ML Pen   Subcutaneous   Inject 7 Units into the skin 2 (two) times daily.         Marland Kitchen ipratropium (ATROVENT) 0.02 % nebulizer solution   Nebulization   Take 0.5 mg by nebulization every 6 (six) hours as needed for wheezing or shortness of breath.         . metoprolol tartrate (LOPRESSOR) 25 MG tablet   Oral   Take 25 mg by mouth 2 (two) times daily.         . Multiple Vitamins-Minerals (MULTIVITAMINS THER. W/MINERALS) TABS tablet   Oral   Take 1 tablet by mouth daily.         Marland Kitchen oseltamivir (TAMIFLU) 75 MG capsule   Oral   Take 75 mg by mouth daily. Last dose scheduled for 06/03/13         . potassium chloride (K-DUR,KLOR-CON) 10 MEQ tablet   Oral   Take  10 mEq by mouth daily.         Marland Kitchen senna-docusate (SENOKOT-S) 8.6-50 MG per tablet   Oral   Take 1 tablet by mouth at bedtime.         . traZODone (DESYREL) 50 MG tablet   Oral   Take 50 mg by mouth at bedtime.         Marland Kitchen venlafaxine XR (EFFEXOR-XR) 75 MG 24 hr capsule   Oral   Take 75 mg by mouth daily.         Marland Kitchen warfarin (COUMADIN) 1 MG tablet   Oral   Take 1.5 mg by mouth at bedtime.          BP 188/161  Pulse 68  Temp(Src) 97.3 F (36.3 C) (Oral)  Resp 20  SpO2 97% Physical Exam  Nursing note and vitals reviewed. Constitutional:  Laying in bed. Appears drowsy obese.   HENT:  Head: Normocephalic and atraumatic.  Eyes: Conjunctivae are normal. Right eye exhibits no discharge. Left eye exhibits no discharge.  Neck: Neck supple.  Cardiovascular: Normal rate, regular rhythm and normal heart sounds.  Exam reveals no gallop and no friction rub.   No murmur heard. Pulmonary/Chest:  R chest thoracostomy site appears to be healing well. Suture material in place. Decreased breath sounds b/l. Crackles. Moderate tachypnea.   Abdominal: Soft. She exhibits no distension. There is no tenderness.  Musculoskeletal: She exhibits no edema and no tenderness.  Pitting LE edema  Neurological: She is alert.  Skin: Skin is warm and dry.  Psychiatric: She has a normal mood and affect. Her behavior is normal. Thought content normal.    ED Course  Procedures (including critical care time) Labs Review Labs Reviewed  MRSA PCR SCREENING - Abnormal; Notable for the following:    MRSA by PCR POSITIVE (*)    All other components within normal limits  CBC WITH DIFFERENTIAL - Abnormal; Notable for the following:    Hemoglobin 10.3 (*)    HCT 32.9 (*)    RDW 19.3 (*)    All other components within normal limits  BASIC METABOLIC PANEL - Abnormal; Notable for the following:    Sodium 136 (*)    Creatinine, Ser 1.66 (*)    GFR calc non Af Amer 30 (*)    GFR calc Af Amer 35 (*)    All  other  components within normal limits  PROTIME-INR - Abnormal; Notable for the following:    Prothrombin Time 17.1 (*)    All other components within normal limits  CBC - Abnormal; Notable for the following:    Hemoglobin 10.6 (*)    HCT 33.7 (*)    RDW 19.4 (*)    All other components within normal limits  HEPATIC FUNCTION PANEL - Abnormal; Notable for the following:    Albumin 2.2 (*)    All other components within normal limits  HEMOGLOBIN A1C - Abnormal; Notable for the following:    Hemoglobin A1C 6.1 (*)    Mean Plasma Glucose 128 (*)    All other components within normal limits  COMPREHENSIVE METABOLIC PANEL - Abnormal; Notable for the following:    CO2 17 (*)    Glucose, Bld 132 (*)    BUN 28 (*)    Creatinine, Ser 2.08 (*)    Calcium 8.2 (*)    Albumin 2.2 (*)    Total Bilirubin 0.2 (*)    GFR calc non Af Amer 23 (*)    GFR calc Af Amer 26 (*)    All other components within normal limits  CBC - Abnormal; Notable for the following:    Hemoglobin 10.4 (*)    HCT 33.6 (*)    MCH 25.7 (*)    RDW 19.6 (*)    All other components within normal limits  PROTIME-INR - Abnormal; Notable for the following:    Prothrombin Time 16.8 (*)    All other components within normal limits  GLUCOSE, CAPILLARY - Abnormal; Notable for the following:    Glucose-Capillary 101 (*)    All other components within normal limits  LACTATE DEHYDROGENASE - Abnormal; Notable for the following:    LDH 261 (*)    All other components within normal limits  LACTATE DEHYDROGENASE, BODY FLUID - Abnormal; Notable for the following:    LD, Fluid 81 (*)    All other components within normal limits  BODY FLUID CELL COUNT WITH DIFFERENTIAL - Abnormal; Notable for the following:    Color, Fluid STRAW (*)    Appearance, Fluid CLOUDY (*)    Monocyte-Macrophage-Serous Fluid 11 (*)    All other components within normal limits  GLUCOSE, CAPILLARY - Abnormal; Notable for the following:    Glucose-Capillary  115 (*)    All other components within normal limits  GLUCOSE, CAPILLARY - Abnormal; Notable for the following:    Glucose-Capillary 131 (*)    All other components within normal limits  GLUCOSE, CAPILLARY - Abnormal; Notable for the following:    Glucose-Capillary 105 (*)    All other components within normal limits  PROTIME-INR - Abnormal; Notable for the following:    Prothrombin Time 19.4 (*)    INR 1.69 (*)    All other components within normal limits  GLUCOSE, CAPILLARY - Abnormal; Notable for the following:    Glucose-Capillary 167 (*)    All other components within normal limits  BASIC METABOLIC PANEL - Abnormal; Notable for the following:    Glucose, Bld 105 (*)    BUN 41 (*)    Creatinine, Ser 2.51 (*)    Calcium 7.7 (*)    GFR calc non Af Amer 18 (*)    GFR calc Af Amer 21 (*)    All other components within normal limits  GLUCOSE, CAPILLARY - Abnormal; Notable for the following:    Glucose-Capillary 189 (*)    All other components within  normal limits  GLUCOSE, CAPILLARY - Abnormal; Notable for the following:    Glucose-Capillary 108 (*)    All other components within normal limits  GLUCOSE, CAPILLARY - Abnormal; Notable for the following:    Glucose-Capillary 191 (*)    All other components within normal limits  PROTIME-INR - Abnormal; Notable for the following:    Prothrombin Time 22.7 (*)    INR 2.08 (*)    All other components within normal limits  BASIC METABOLIC PANEL - Abnormal; Notable for the following:    Glucose, Bld 113 (*)    BUN 36 (*)    Creatinine, Ser 2.02 (*)    Calcium 7.9 (*)    GFR calc non Af Amer 24 (*)    GFR calc Af Amer 27 (*)    All other components within normal limits  GLUCOSE, CAPILLARY - Abnormal; Notable for the following:    Glucose-Capillary 117 (*)    All other components within normal limits  GLUCOSE, CAPILLARY - Abnormal; Notable for the following:    Glucose-Capillary 119 (*)    All other components within normal limits   GLUCOSE, CAPILLARY - Abnormal; Notable for the following:    Glucose-Capillary 129 (*)    All other components within normal limits  PROTIME-INR - Abnormal; Notable for the following:    Prothrombin Time 23.5 (*)    INR 2.17 (*)    All other components within normal limits  CBC - Abnormal; Notable for the following:    RBC 3.85 (*)    Hemoglobin 9.7 (*)    HCT 32.3 (*)    MCH 25.2 (*)    RDW 19.5 (*)    All other components within normal limits  BASIC METABOLIC PANEL - Abnormal; Notable for the following:    Glucose, Bld 121 (*)    BUN 33 (*)    Creatinine, Ser 1.89 (*)    GFR calc non Af Amer 26 (*)    GFR calc Af Amer 30 (*)    All other components within normal limits  GLUCOSE, CAPILLARY - Abnormal; Notable for the following:    Glucose-Capillary 133 (*)    All other components within normal limits  GLUCOSE, CAPILLARY - Abnormal; Notable for the following:    Glucose-Capillary 108 (*)    All other components within normal limits  GLUCOSE, CAPILLARY - Abnormal; Notable for the following:    Glucose-Capillary 130 (*)    All other components within normal limits  POCT I-STAT 3, BLOOD GAS (G3+) - Abnormal; Notable for the following:    pH, Arterial 7.341 (*)    Bicarbonate 19.3 (*)    Acid-base deficit 6.0 (*)    All other components within normal limits  CULTURE, BLOOD (ROUTINE X 2)  CULTURE, BLOOD (ROUTINE X 2)  BODY FLUID CULTURE  TROPONIN I  MAGNESIUM  TSH  TROPONIN I  INFLUENZA PANEL BY PCR  TROPONIN I  TROPONIN I  GLUCOSE, CAPILLARY  GLUCOSE, SEROUS FLUID  ALBUMIN, FLUID  PROTEIN, BODY FLUID  HEMATOCRIT, BODY FLUID  ADENOSINE DEAMINASE, FLUID  GLUCOSE, CAPILLARY  GLUCOSE, CAPILLARY  CYTOLOGY - NON PAP   Imaging Review Dg Chest Portable 1 View  05/30/2013   CLINICAL DATA:  Shortness of breath.  CHF.  EXAM: PORTABLE CHEST - 1 VIEW  COMPARISON:  05/20/2013  FINDINGS: PICC line tip is in the left innominate vein region. Diffuse interstitial densities suggest  pulmonary edema. Increased densities in the right lower chest could represent layering pleural fluid. Heart  size appears to be upper limits of normal. No evidence for a pneumothorax.  IMPRESSION: Increased densities in the right lower chest may represent enlarging pleural effusion.  Interstitial pulmonary edema.  PICC line tip in the left innominate vein region.   Electronically Signed   By: Richarda Overlie M.D.   On: 05/30/2013 13:30    EKG Interpretation    Date/Time:  Saturday May 30 2013 11:45:38 EST Ventricular Rate:  71 PR Interval:    QRS Duration: 123 QT Interval:  440 QTC Calculation: 478 R Axis:   -60 Text Interpretation:  Atrial fibrillation Ventricular premature complex Right bundle branch block LVH with IVCD and secondary repol abnrm Probable posterior infarct, acute ED PHYSICIAN INTERPRETATION AVAILABLE IN [facility] Confirmed by TEST, RECORD ([id number]) on 06/01/2013 7:39:31 AM            MDM   1. Pleural effusion   2. Acute respiratory failure   3. Acute diastolic heart failure   4. Dyspnea   5. Paroxysmal atrial fibrillation   6. Chronic anticoagulation   7. Dementia      He is-year-old with increasing shortness of breath and increased oxygen requirement. Chest x-ray shows reaccumulation a right-sided pleural effusion. This is more than likely reaccumulation of the priorly noted effusion status post thoracentesis. The rest of her lungs show interstitial edema. Patient has a history of heart failure. Markedly hypertensive and suspect that there is some component of acute systolic heart failure. She was started on nitroglycerin drip.    Raeford Razor, MD 06/04/13 [phone]

## 2013-05-30 NOTE — Progress Notes (Addendum)
Anticoagulation and Antibiotic Consult Note  Pharmacy Consult for warfarin  Indication: atrial fibrillation  Pharmacy Consult for vancomycin and zosyn Indication: pneumonia   Allergies  Allergen Reactions  . Baclofen   . Clindamycin/Lincomycin   . Vancomycin     Vital Signs: Temp: 97.3 F (36.3 C) (01/03 1148) Temp src: Oral (01/03 1148) BP: 97/81 mmHg (01/03 1606) Pulse Rate: 62 (01/03 1606)  Labs:  Recent Labs  05/30/13 1415  HGB 10.3*  HCT 32.9*  PLT 203  LABPROT 17.1*  INR 1.43  CREATININE 1.66*  TROPONINI <0.30    CrCl is unknown because there is no height on file for the current visit.   Medical History: Past Medical History  Diagnosis Date  . Pneumonia   . Pleural effusion   . CHF (congestive heart failure)   . Paroxysmal atrial fibrillation   . COPD (chronic obstructive pulmonary disease)   . Stroke   . Dysarthria due to cerebrovascular accident   . 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 Coronary artery disease   . Diabetes mellitus without complication   . GERD (gastroesophageal reflux disease)   . Dementia     Medications:  Warfarin 1.5mg  daily at bedtime pta  Assessment: [facility] and [facility]. Patient is on coumadin pta for afib and history of cva, INR subtherapeutic. Does not appear that thoracentesis is planned at this time and orders to resume warfarin. Patient also started on broad spectrum antibiotics for pneumonia.   Patient noted to have an allergy to vancomycin on admission. Patient unable to explain allergy. Per records on hand from [facility] hospital she received vancomycin for several days upon that admission until discharged to [facility] and no incident of allergic reaction was mentioned. Allergy deleted and orders for vancomycin entered. Will follow closely for allergic reaction and  will watch dosing closely to not contribute to renal insufficiency.  Goal of Therapy:  INR 2-3 Monitor platelets by anticoagulation protocol: Yes Vancomycin trough 15-20   Plan:  Warfarin 2mg  tonight -d/c lovenox when INR>2 Vancomycin 1000mg  IV now then 750mg  q24 hours Zosyn 3.375g IV q8 hours Follow up renal function and culture data  Sheppard CoilFrank Wilson PharmD., BCPS Clinical Pharmacist Pager [phone] 05/30/2013 5:14 PM  Addendum: MD has consulted pharmacy to transition from Zosyn to Primaxin for PNA coverage. - Weight: 78kg - CrCl 29 ml/min  Plan: 1. Primaxin 250mg  IV q6h 2. Monitor renal function and adjust as indicated  Wilfred LacyWesley Antoniette Peake, PharmD Clinical Pharmacist [phone] 05/30/2013, 7:35 PM

## 2013-05-30 NOTE — Progress Notes (Signed)
CRITICAL VALUE ALERT  Critical value received:  MRSA positive nasal pcr  Date of notification:  05/30/2013   Time of notification:  2148  Critical value read back: yes  Nurse who received alert: Birdena CrandallAmanda Senta Kantor RN  MD notified (1st page):    Time of first page:    MD notified (2nd page):  Time of second page:  Responding MD:    Time MD responded:

## 2013-05-30 NOTE — ED Notes (Signed)
RN updated RN on [facility] on patient's status.

## 2013-05-30 NOTE — ED Notes (Signed)
RN entered room to assess patient. Pt c/o of sudden onset severe abdominal pain and nausea, pt's work of breathing seems to be increased. Wheezing auscultated. EDP made aware, admitting MD made aware, Pulmonologist made aware. Suspected allergic reaction to Zosyn, medication discontinued.

## 2013-05-30 NOTE — ED Notes (Addendum)
Pt reports her nausea and itching has ceased after receiving medications. Pt work of breathing has improved. Pt states she is much more comfortable at this time. RN to call and update [facility] before taking patient upstairs.

## 2013-05-30 NOTE — H&P (Addendum)
[facility] History and Physical  Ariana [name] WJX:[id number] DOB: 09-25-1940 DOA: 05/30/2013  Referring physician:  PCP: Garlan Fillers, MD   Chief Complaint: Shortness of breath   HPI:  73 year old female with a history of recent hospitalization in [facility] in November 11/12, for pneumonia and parapneumonic effusion, presents to the ED today with shortness of breath. [facility]. She came in on a nonrebreather, with diminished breath sounds. She was found to be 82% on room air. According to the patient's daughter-in-law the patient has been afebrile, but has developed progressive shortness of breath. Chest x-ray today showed increased densities in the right lower lobe and enlarging right pleural effusion with pulmonary edema. She feels a little  better after receiving one dose of IV Lasix Of note is that the patient has a history of CVA, she has garbled speech today. Daughter-in-law states that her speech is garbled when the patient is stressed out. Her CVA was several years ago. The patient's INR is subtherapeutic today at 1.43   In [facility], the patient was admitted with pneumonia, parapneumonic effusion. According to the daughter in law she had 2 thoracentesis done, in the hospital.She subsequently had tube thoracostomy after re accumulated and transferred to [facility] on 11/20. While in [facility] the patient had a recurrent right pleural effusion, she had another thoracentesis done and had 1.8 L of fluid drained. She had a chest CT on 11/23 that showed increased fluid collection and had a right pleural catheter placed on 11/24, by interventional radiology. At that time she had another 1.2 L of fluid removed.Pt apparently developed pain and decreased output and drain was removed at  bedside by [facility] MD 11/30 On 12/8 patient had another  therapeutic thoracentesis done.  2-D echo on 12/12 showed normal  EF, left ventricular hypertrophy       Review of Systems: negative for the following  Constitutional: Positive for weight loss. Negative for fever.  Respiratory: Positive for shortness of breath and wheezing.  Cardiovascular: Positive for chest pain and orthopnea.  Gastrointestinal: Negative for nausea, vomiting and abdominal pain.  Neurological: Positive for weakness.  .  Hematological: Denies adenopathy. Easy bruising, personal or family bleeding history  Psychiatric/Behavioral: Denies suicidal ideation, mood changes, confusion, nervousness, sleep disturbance and agitation       Past Medical History  Diagnosis Date  . Pneumonia   . Pleural effusion   . CHF (congestive heart failure)   . Paroxysmal atrial fibrillation   . COPD (chronic obstructive pulmonary disease)   . Stroke   . Dysarthria due to cerebrovascular accident   . 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 Coronary artery disease   . Diabetes mellitus without complication   . GERD (gastroesophageal reflux disease)   . Dementia      Past Surgical History  Procedure Laterality Date  . Thoracostomy    . Thoracentesis        Social History:  reports that she has never smoked. She does not have any smokeless tobacco history on file. She reports that she does not drink alcohol or use illicit drugs.    Allergies  Allergen Reactions  . Baclofen   . Clindamycin/Lincomycin   . Vancomycin     No family history on file.   Prior to Admission medications   Medication Sig Start Date End Date Taking? Authorizing Provider  albuterol (PROVENTIL) (2.5 MG/3ML) 0.083% nebulizer solution Take 2.5  mg by nebulization every 6 (six) hours as needed for wheezing or shortness of breath.   Yes Historical Provider, MD  aspirin (ASPIRIN ADULT LOW STRENGTH) 81 MG chewable tablet Chew 81 mg by mouth daily.   Yes Historical Provider, MD  atorvastatin (LIPITOR) 20 MG tablet Take 20 mg by  mouth at bedtime.   Yes Historical Provider, MD  clonazePAM (KLONOPIN) 0.5 MG tablet Take 0.5 mg by mouth 2 (two) times daily.   Yes Historical Provider, MD  docusate sodium (COLACE) 100 MG capsule Take 100 mg by mouth 2 (two) times daily.   Yes Historical Provider, MD  famotidine (PEPCID) 20 MG tablet Take 20 mg by mouth 2 (two) times daily.   Yes Historical Provider, MD  furosemide (LASIX) 40 MG tablet Take 40 mg by mouth daily.   Yes Historical Provider, MD  Insulin Detemir (LEVEMIR FLEXTOUCH) 100 UNIT/ML Pen Inject 7 Units into the skin 2 (two) times daily.   Yes Historical Provider, MD  ipratropium (ATROVENT) 0.02 % nebulizer solution Take 0.5 mg by nebulization every 6 (six) hours as needed for wheezing or shortness of breath.   Yes Historical Provider, MD  metoprolol tartrate (LOPRESSOR) 25 MG tablet Take 25 mg by mouth 2 (two) times daily.   Yes Historical Provider, MD  Multiple Vitamins-Minerals (MULTIVITAMINS THER. W/MINERALS) TABS tablet Take 1 tablet by mouth daily.   Yes Historical Provider, MD  oseltamivir (TAMIFLU) 75 MG capsule Take 75 mg by mouth daily. Last dose scheduled for 06/03/13   Yes Historical Provider, MD  potassium chloride (K-DUR,KLOR-CON) 10 MEQ tablet Take 10 mEq by mouth daily.   Yes Historical Provider, MD  senna-docusate (SENOKOT-S) 8.6-50 MG per tablet Take 1 tablet by mouth at bedtime.   Yes Historical Provider, MD  traZODone (DESYREL) 50 MG tablet Take 50 mg by mouth at bedtime.   Yes Historical Provider, MD  venlafaxine XR (EFFEXOR-XR) 75 MG 24 hr capsule Take 75 mg by mouth daily.   Yes Historical Provider, MD  warfarin (COUMADIN) 1 MG tablet Take 1.5 mg by mouth at bedtime.   Yes Historical Provider, MD     Physical Exam: Filed Vitals:   05/30/13 1400 05/30/13 1430 05/30/13 1506 05/30/13 1606  BP: 156/130 188/161 94/62 97/81  Pulse: 62 68 68 62  Temp:      TempSrc:      Resp: 20 20 24 16   SpO2: 100% 97% 97% 98%     Constitutional: Vital signs  reviewed. Patient is a well-developed and well-nourished in no acute distress and cooperative with exam. Alert and oriented x3.  Head: Normocephalic and atraumatic  Ear: TM normal bilaterally  Mouth: no erythema or exudates, MMM  Eyes: PERRL, EOMI, conjunctivae normal, No scleral icterus.  Neck: Supple, Trachea midline normal ROM, No JVD, mass, thyromegaly, or carotid bruit present.  Cardiovascular: RRR, S1 normal, S2 normal, no MRG, pulses symmetric and intact bilaterally  Pulmonary/Chest: CTAB, no wheezes, rales, or rhonchi  Abdominal: Soft. Non-tender, non-distended, bowel sounds are normal, no masses, organomegaly, or guarding present.  GU: no CVA tenderness Musculoskeletal: No joint deformities, erythema, or stiffness, ROM full and no nontender Ext: no edema and no cyanosis, pulses palpable bilaterally (DP and PT)  Hematology: no cervical, inginal, or axillary adenopathy.  Neurological: A&O x3, Strenght is normal and symmetric bilaterally, cranial nerve II-XII are grossly intact, no focal motor deficit, sensory intact to light touch bilaterally.  Skin: Warm, dry and intact. No rash, cyanosis, or clubbing.  Psychiatric: Normal mood and  affect. speech and behavior is normal. Judgment and thought content normal. Cognition and memory are normal.       Labs on Admission:    Basic Metabolic Panel:  Recent Labs Lab 05/24/13 0555 05/30/13 1415  NA 135 136*  K 4.4 4.5  CL 106 105  CO2 21 20  GLUCOSE 107* 75  BUN 27* 22  CREATININE 1.88* 1.66*  CALCIUM 8.2* 8.5   Liver Function Tests: No results found for this basename: AST, ALT, ALKPHOS, BILITOT, PROT, ALBUMIN,  in the last 168 hours No results found for this basename: LIPASE, AMYLASE,  in the last 168 hours No results found for this basename: AMMONIA,  in the last 168 hours CBC:  Recent Labs Lab 05/24/13 0555 05/30/13 1415  WBC 6.7 6.8  NEUTROABS  --  4.8  HGB 9.9* 10.3*  HCT 31.7* 32.9*  MCV 83.0 83.1  PLT 176 203    Cardiac Enzymes:  Recent Labs Lab 05/30/13 1415  TROPONINI <0.30    BNP (last 3 results)  Recent Labs  05/06/13 0636 05/08/13 1054  PROBNP 11763.0* 17420.0*      CBG: No results found for this basename: GLUCAP,  in the last 168 hours  Radiological Exams on Admission: Dg Chest Portable 1 View  05/30/2013   CLINICAL DATA:  Shortness of breath.  CHF.  EXAM: PORTABLE CHEST - 1 VIEW  COMPARISON:  05/20/2013  FINDINGS: PICC line tip is in the left innominate vein region. Diffuse interstitial densities suggest pulmonary edema. Increased densities in the right lower chest could represent layering pleural fluid. Heart size appears to be upper limits of normal. No evidence for a pneumothorax.  IMPRESSION: Increased densities in the right lower chest may represent enlarging pleural effusion.  Interstitial pulmonary edema.  PICC line tip in the left innominate vein region.   Electronically Signed   By: Richarda Overlie M.D.   On: 05/30/2013 13:30    EKG: Independently reviewed.  Assessment/Plan Active Problems:   Pleural effusion   Dyspnea  Hypoxemic respiratory failure Probably secondary to pulmonary edema and reaccumulation of right-sided pleural effusion, cannot rule out pneumonia Will obtain a CT scan of the chest without contrast Will continue IV Lasix Continue nonrebreather and continue to titrate oxygen Placed in step down Start the patient on broad-spectrum antibiotics Pulmonary consultation Dr Aurelio Jew ,notified Interventional radiology consulted for thoracentesis Patient noted to be acutely short of breath after receiving Zosyn, placed on BiPAP, received Solu-Medrol, Pepcid, another dose of Lasix Pulmonary paged  again   History of CVA Not sure if the patient has had another event in the setting of subtherapeutic INR Will repeat CT of the head next    History of atrial fibrillation Blood pressure soft, after receiving IV Lasix Will continue low-dose beta blocker  with hold parameters On anticoagulation, resume Coumadin after either thoracentesis or chest tube placement    Weakness Chronic hemiparesis affecting the right side  Diabetes mellitus, insulin-dependent Continue Lantus, Accu-Cheks, sliding scale insulin  Code Status:   full Family Communication: bedside Disposition Plan: admit   Time spent: 70 mins   [facility] Pager [phone]  If 7PM-7AM, please contact night-coverage [email or web address] Password [facility] 05/30/2013, 4:34 PM

## 2013-05-30 NOTE — ED Notes (Signed)
Pt's CBG is 101. Ian MalkinZach RN notified

## 2013-05-30 NOTE — ED Notes (Signed)
Dr Juleen Chinakohut aware of blood pressure

## 2013-05-30 NOTE — ED Notes (Signed)
POX 82% on RA. Placed on 15L NRB immediately with increase POX to >99%.

## 2013-05-30 NOTE — ED Notes (Signed)
Spoke with iv team and they will come and draw blood off of picc but it will be a while before they get down here.

## 2013-05-30 NOTE — ED Notes (Signed)
Pt placed on bed pan, small BM noted.

## 2013-05-31 ENCOUNTER — Inpatient Hospital Stay (HOSPITAL_COMMUNITY): Payer: Medicare Other

## 2013-05-31 DIAGNOSIS — J9601 Acute respiratory failure with hypoxia: Secondary | ICD-10-CM | POA: Diagnosis present

## 2013-05-31 DIAGNOSIS — I5031 Acute diastolic (congestive) heart failure: Secondary | ICD-10-CM | POA: Diagnosis present

## 2013-05-31 DIAGNOSIS — J9600 Acute respiratory failure, unspecified whether with hypoxia or hypercapnia: Secondary | ICD-10-CM

## 2013-05-31 DIAGNOSIS — J90 Pleural effusion, not elsewhere classified: Secondary | ICD-10-CM

## 2013-05-31 LAB — BODY FLUID CELL COUNT WITH DIFFERENTIAL
Eos, Fluid: 1 %
Lymphs, Fluid: 82 %
Monocyte-Macrophage-Serous Fluid: 11 % — ABNORMAL LOW (ref 50–90)
Neutrophil Count, Fluid: 7 % (ref 0–25)
Total Nucleated Cell Count, Fluid: 531 cu mm (ref 0–1000)

## 2013-05-31 LAB — TSH: TSH: 2.38 u[IU]/mL (ref 0.350–4.500)

## 2013-05-31 LAB — TROPONIN I: Troponin I: <0.3 ng/mL (ref <0.30)

## 2013-05-31 LAB — COMPREHENSIVE METABOLIC PANEL
ALBUMIN: 2.2 g/dL — AB (ref 3.5–5.2)
ALT: 16 U/L (ref 0–35)
AST: 32 U/L (ref 0–37)
Alkaline Phosphatase: 73 U/L (ref 39–117)
BUN: 28 mg/dL — ABNORMAL HIGH (ref 6–23)
CO2: 17 mEq/L — ABNORMAL LOW (ref 19–32)
CREATININE: 2.08 mg/dL — AB (ref 0.50–1.10)
Calcium: 8.2 mg/dL — ABNORMAL LOW (ref 8.4–10.5)
Chloride: 108 mEq/L (ref 96–112)
GFR calc Af Amer: 26 mL/min — ABNORMAL LOW (ref >90)
GFR calc non Af Amer: 23 mL/min — ABNORMAL LOW (ref >90)
Glucose, Bld: 132 mg/dL — ABNORMAL HIGH (ref 70–99)
Potassium: 5.3 mEq/L (ref 3.7–5.3)
SODIUM: 139 meq/L (ref 137–147)
TOTAL PROTEIN: 7.6 g/dL (ref 6.0–8.3)
Total Bilirubin: 0.2 mg/dL — ABNORMAL LOW (ref 0.3–1.2)

## 2013-05-31 LAB — CBC
HEMATOCRIT: 33.6 % — AB (ref 36.0–46.0)
Hemoglobin: 10.4 g/dL — ABNORMAL LOW (ref 12.0–15.0)
MCH: 25.7 pg — AB (ref 26.0–34.0)
MCHC: 31 g/dL (ref 30.0–36.0)
MCV: 83.2 fL (ref 78.0–100.0)
Platelets: 217 10*3/uL (ref 150–400)
RBC: 4.04 MIL/uL (ref 3.87–5.11)
RDW: 19.6 % — ABNORMAL HIGH (ref 11.5–15.5)
WBC: 6.5 10*3/uL (ref 4.0–10.5)

## 2013-05-31 LAB — INFLUENZA PANEL BY PCR (TYPE A & B)
H1N1FLUPCR: NOT DETECTED
INFLAPCR: NEGATIVE
INFLBPCR: NEGATIVE

## 2013-05-31 LAB — PROTEIN, BODY FLUID: Total protein, fluid: 2 g/dL

## 2013-05-31 LAB — ALBUMIN, FLUID (OTHER): Albumin, Fluid: 0.7 g/dL

## 2013-05-31 LAB — LACTATE DEHYDROGENASE: LDH: 261 U/L — ABNORMAL HIGH (ref 94–250)

## 2013-05-31 LAB — GLUCOSE, CAPILLARY
GLUCOSE-CAPILLARY: 115 mg/dL — AB (ref 70–99)
Glucose-Capillary: 131 mg/dL — ABNORMAL HIGH (ref 70–99)
Glucose-Capillary: 105 mg/dL — ABNORMAL HIGH (ref 70–99)
Glucose-Capillary: 167 mg/dL — ABNORMAL HIGH (ref 70–99)
Glucose-Capillary: 189 mg/dL — ABNORMAL HIGH (ref 70–99)

## 2013-05-31 LAB — PROTIME-INR
INR: 1.4 (ref 0.00–1.49)
Prothrombin Time: 16.8 seconds — ABNORMAL HIGH (ref 11.6–15.2)

## 2013-05-31 LAB — LACTATE DEHYDROGENASE, PLEURAL OR PERITONEAL FLUID: LD, Fluid: 81 U/L — ABNORMAL HIGH (ref 3–23)

## 2013-05-31 LAB — HEMATOCRIT, BODY FLUID: Hematocrit, Fluid: <2 %

## 2013-05-31 LAB — GLUCOSE, SEROUS FLUID: Glucose, Fluid: 113 mg/dL

## 2013-05-31 LAB — HEMOGLOBIN A1C
Hgb A1c MFr Bld: 6.1 % — ABNORMAL HIGH (ref <5.7)
MEAN PLASMA GLUCOSE: 128 mg/dL — AB (ref <117)

## 2013-05-31 MED ORDER — IPRATROPIUM BROMIDE 0.02 % IN SOLN
0.5000 mg | Freq: Four times a day (QID) | RESPIRATORY_TRACT | Status: DC | PRN
  Administered 2013-05-31: 0.5 mg via RESPIRATORY_TRACT
  Filled 2013-05-31: qty 2.5

## 2013-05-31 MED ORDER — LEVALBUTEROL HCL 0.63 MG/3ML IN NEBU
0.6300 mg | INHALATION_SOLUTION | Freq: Three times a day (TID) | RESPIRATORY_TRACT | Status: DC
  Administered 2013-05-31 – 2013-06-03 (×10): 0.63 mg via RESPIRATORY_TRACT
  Filled 2013-05-31 (×22): qty 3

## 2013-05-31 MED ORDER — IPRATROPIUM BROMIDE 0.02 % IN SOLN
0.5000 mg | Freq: Three times a day (TID) | RESPIRATORY_TRACT | Status: DC
  Administered 2013-05-31 (×2): 0.5 mg via RESPIRATORY_TRACT
  Filled 2013-05-31 (×2): qty 2.5

## 2013-05-31 MED ORDER — PNEUMOCOCCAL VAC POLYVALENT 25 MCG/0.5ML IJ INJ
0.5000 mL | INJECTION | INTRAMUSCULAR | Status: AC
  Administered 2013-06-01: 0.5 mL via INTRAMUSCULAR
  Filled 2013-05-31: qty 0.5

## 2013-05-31 MED ORDER — WARFARIN SODIUM 1 MG PO TABS
2.2500 mg | ORAL_TABLET | Freq: Once | ORAL | Status: AC
  Administered 2013-05-31: 2.25 mg via ORAL
  Filled 2013-05-31: qty 1

## 2013-05-31 MED ORDER — OSELTAMIVIR PHOSPHATE 30 MG PO CAPS: 30.0000 mg | ORAL_CAPSULE | Freq: Every day | ORAL | Status: DC

## 2013-05-31 MED ORDER — FUROSEMIDE 10 MG/ML IJ SOLN
40.0000 mg | Freq: Two times a day (BID) | INTRAMUSCULAR | Status: DC
  Administered 2013-05-31 – 2013-06-01 (×2): 40 mg via INTRAVENOUS
  Filled 2013-05-31 (×3): qty 4

## 2013-05-31 NOTE — Discharge Instructions (Signed)
Information on my medicine - Coumadin   (Warfarin)  This medication education was reviewed with me or my healthcare representative as part of my discharge preparation.  The pharmacist that spoke with me during my hospital stay was:  Kaylyn LayerCope, Korrina Zern E, [facility]  Why was Coumadin prescribed for you? Coumadin was prescribed for you because you have a blood clot or a medical condition that can cause an increased risk of forming blood clots. Blood clots can cause serious health problems by blocking the flow of blood to the heart, lung, or brain. Coumadin can prevent harmful blood clots from forming. As a reminder your indication for Coumadin is:   Stroke Prevention Because Of Atrial Fibrillation  What test will check on my response to Coumadin? While on Coumadin (warfarin) you will need to have an INR test regularly to ensure that your dose is keeping you in the desired range. The INR (international normalized ratio) number is calculated from the result of the laboratory test called prothrombin time (PT).  If an INR APPOINTMENT HAS NOT ALREADY BEEN MADE FOR YOU please schedule an appointment to have this lab work done by your health care provider within 7 days. Your INR goal is usually a number between:  2 to 3 or your provider may give you a more narrow range like 2-2.5.  Ask your health care provider during an office visit what your goal INR is.  What  do you need to  know  About  COUMADIN? Take Coumadin (warfarin) exactly as prescribed by your healthcare provider about the same time each day.  DO NOT stop taking without talking to the doctor who prescribed the medication.  Stopping without other blood clot prevention medication to take the place of Coumadin may increase your risk of developing a new clot or stroke.  Get refills before you run out.  What do you do if you miss a dose? If you miss a dose, take it as soon as you remember on the same day then continue your regularly scheduled regimen the next  day.  Do not take two doses of Coumadin at the same time.  Important Safety Information A possible side effect of Coumadin (Warfarin) is an increased risk of bleeding. You should call your healthcare provider right away if you experience any of the following:   Bleeding from an injury or your nose that does not stop.   Unusual colored urine (red or dark brown) or unusual colored stools (red or black).   Unusual bruising for unknown reasons.   A serious fall or if you hit your head (even if there is no bleeding).  Some foods or medicines interact with Coumadin (warfarin) and might alter your response to warfarin. To help avoid this:   Eat a balanced diet, maintaining a consistent amount of Vitamin K.   Notify your provider about major diet changes you plan to make.   Avoid alcohol or limit your intake to 1 drink for women and 2 drinks for men per day. (1 drink is 5 oz. wine, 12 oz. beer, or 1.5 oz. liquor.)  Make sure that ANY health care provider who prescribes medication for you knows that you are taking Coumadin (warfarin).  Also make sure the healthcare provider who is monitoring your Coumadin knows when you have started a new medication including herbals and non-prescription products.  Coumadin (Warfarin)  Major Drug Interactions  Increased Warfarin Effect Decreased Warfarin Effect  Alcohol (large quantities) Antibiotics (esp. Septra/Bactrim, Flagyl, Cipro) Amiodarone (Cordarone) Aspirin (  naproxen, etc.) °Piroxicam (Feldene) °Propafenone (Rythmol SR) °Propranolol (Inderal) °Isoniazid (INH) °Posaconazole (Noxafil) Barbiturates (Phenobarbital) °Carbamazepine (Tegretol) °Chlordiazepoxide (Librium) °Cholestyramine (Questran) °Griseofulvin °Oral Contraceptives °Rifampin °Sucralfate (Carafate) °Vitamin K  ° °Coumadin® (Warfarin) Major Herbal Interactions  °Increased Warfarin Effect Decreased Warfarin Effect   °Garlic °Ginseng °Ginkgo biloba Coenzyme Q10 °Green tea °St. John’s wort   ° °Coumadin® (Warfarin) FOOD Interactions  °Eat a consistent number of servings per week of foods HIGH in Vitamin K °(1 serving = ½ cup)  °Collards (cooked, or boiled & drained) °Kale (cooked, or boiled & drained) °Mustard greens (cooked, or boiled & drained) °Parsley *serving size only = ¼ cup °Spinach (cooked, or boiled & drained) °Swiss chard (cooked, or boiled & drained) °Turnip greens (cooked, or boiled & drained)  °Eat a consistent number of servings per week of foods MEDIUM-HIGH in Vitamin K °(1 serving = 1 cup)  °Asparagus (cooked, or boiled & drained) °Broccoli (cooked, boiled & drained, or raw & chopped) °Brussel sprouts (cooked, or boiled & drained) *serving size only = ½ cup °Lettuce, raw (green leaf, endive, romaine) °Spinach, raw °Turnip greens, raw & chopped  ° °These websites have more information on Coumadin (warfarin):  www.coumadin.com; °www.ahrq.gov/consumer/coumadin.htm; ° ° °

## 2013-05-31 NOTE — Procedures (Signed)
Thoracentesis Procedure Note  Pre-operative Diagnosis: Right pleural effusion  Post-operative Diagnosis:  same  Indications:   Respiratory distress with pleural effusion  Procedure Details   Consent: Informed consent was obtained. Risks of the procedure were discussed including: infection, bleeding, pain, pneumothorax.   Under sterile conditions the patient was positioned. Betadine solution and sterile drapes were utilized.  1% plain lidocaine was used to anesthetize the right posterior 7th rib space. Fluid was obtained without any difficulties and minimal blood loss.  A dressing was applied to the wound and wound care instructions were provided.   Findings 1100 ml of light amber pleural fluid was obtained. A sample was sent to Pathology for cytogenetics, and cell counts, as well as for infection analysis.    Complications:  None; patient tolerated the procedure well.        Condition: stable  Plan A follow up chest x-ray was ordered. Bed Rest for 0 hours.  Wadie LessenMario Caelan Branden MD

## 2013-05-31 NOTE — Progress Notes (Signed)
05/31/13 patient was not on NIV when RT gave treatment. Patient is on 4L [place] sats 100%. Patient is stable RT will continue to montior.

## 2013-05-31 NOTE — Consult Note (Signed)
Name: Ariana [name] MRN: [id number] DOB: [birth year]    ADMISSION DATE:  05/30/2013 CONSULTATION DATE:  05/30/13  REFERRING MD :  Dr. Susie Cassette  CHIEF COMPLAINT:  Dyspnea  BRIEF PATIENT DESCRIPTION: 21 F with history of diastolic heart failure now presenting with recurrent pleural effusion, this time transudative, clinically very consistent with effusion due to heart failure  SIGNIFICANT EVENTS / STUDIES:  Thoracentesis done 05/31/13, fluid analysis consistent with transudate  LINES / TUBES:   CULTURES: Pleural fluid culture pending  ANTIBIOTICS: Primaxin > stopped 05/31/13 Vancomycin > stopped 05/31/13  HISTORY OF PRESENT ILLNESS:  This is a critical care/pulmonary consult for a 73 year old female with a history of recent hospitalization in [facility] in November 11/12, for pneumonia and parapneumonic effusion, presents to the ED with shortness of breath. [facility] She came in on a nonrebreather, with diminished breath sounds. She was found to be 82% on room air. According to the patient's daughter-in-law the patient has been afebrile, but has developed progressive shortness of breath. Chest x-ray showed increased densities in the right lower lobe and enlarging right pleural effusion with pulmonary edema.  She feels a little better after receiving one dose of IV Lasix. She has been getting broad spectrum abx. Of note is that the patient has a history of CVA with some residual right weakness. Her CVA was several years ago. The patient's INR is subtherapeutic She denies fever, chills, productive cough, nor pleuritic chest pain. No dysuria, no diarrhea, no N/V, no night sweat, no acute weight loss, no lumps or bumps, no vision changes, no headaches, no dizziness, no palpitations, no leg swelling, no calf pain, no abd pain. She is currently stable in BiPAP.   PAST MEDICAL HISTORY :  Past Medical History  Diagnosis Date  . Pneumonia     . Pleural effusion   . CHF (congestive heart failure)   . Paroxysmal atrial fibrillation   . COPD (chronic obstructive pulmonary disease)   . Stroke   . Dysarthria due to cerebrovascular accident   . 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 Coronary artery disease   . Diabetes mellitus without complication   . GERD (gastroesophageal reflux disease)   . Dementia    Past Surgical History  Procedure Laterality Date  . Thoracostomy    . Thoracentesis     Prior to Admission medications   Medication Sig Start Date End Date Taking? Authorizing Provider  albuterol (PROVENTIL) (2.5 MG/3ML) 0.083% nebulizer solution Take 2.5 mg by nebulization every 6 (six) hours as needed for wheezing or shortness of breath.   Yes Historical Provider, MD  aspirin (ASPIRIN ADULT LOW STRENGTH) 81 MG chewable tablet Chew 81 mg by mouth daily.   Yes Historical Provider, MD  atorvastatin (LIPITOR) 20 MG tablet Take 20 mg by mouth at bedtime.   Yes Historical Provider, MD  clonazePAM (KLONOPIN) 0.5 MG tablet Take 0.5 mg by mouth 2 (two) times daily.   Yes Historical Provider, MD  docusate sodium (COLACE) 100 MG capsule Take 100 mg by mouth 2 (two) times daily.   Yes Historical Provider, MD  famotidine (PEPCID) 20 MG tablet Take 20 mg by mouth 2 (two) times daily.   Yes Historical Provider, MD  furosemide (LASIX) 40 MG tablet Take 40 mg by mouth daily.   Yes Historical Provider, MD  Insulin Detemir (LEVEMIR FLEXTOUCH) 100 UNIT/ML Pen Inject 7 Units into the skin 2 (two) times  daily.   Yes Historical Provider, MD  ipratropium (ATROVENT) 0.02 % nebulizer solution Take 0.5 mg by nebulization every 6 (six) hours as needed for wheezing or shortness of breath.   Yes Historical Provider, MD  metoprolol tartrate (LOPRESSOR) 25 MG tablet Take 25 mg by mouth 2 (two) times daily.   Yes Historical Provider, MD  Multiple Vitamins-Minerals (MULTIVITAMINS THER. W/MINERALS) TABS tablet Take 1  tablet by mouth daily.   Yes Historical Provider, MD  oseltamivir (TAMIFLU) 75 MG capsule Take 75 mg by mouth daily. Last dose scheduled for 06/03/13   Yes Historical Provider, MD  potassium chloride (K-DUR,KLOR-CON) 10 MEQ tablet Take 10 mEq by mouth daily.   Yes Historical Provider, MD  senna-docusate (SENOKOT-S) 8.6-50 MG per tablet Take 1 tablet by mouth at bedtime.   Yes Historical Provider, MD  traZODone (DESYREL) 50 MG tablet Take 50 mg by mouth at bedtime.   Yes Historical Provider, MD  venlafaxine XR (EFFEXOR-XR) 75 MG 24 hr capsule Take 75 mg by mouth daily.   Yes Historical Provider, MD  warfarin (COUMADIN) 1 MG tablet Take 1.5 mg by mouth at bedtime.   Yes Historical Provider, MD   Allergies  Allergen Reactions  . Baclofen   . Clindamycin/Lincomycin     FAMILY HISTORY:  No family history on file. SOCIAL HISTORY:  reports that she has never smoked. She does not have any smokeless tobacco history on file. She reports that she does not drink alcohol or use illicit drugs.  REVIEW OF SYSTEMS:  See HPI. All other systems negative.  SUBJECTIVE:   VITAL SIGNS: Temp:  [97.3 F (36.3 C)-97.8 F (36.6 C)] 97.8 F (36.6 C) (01/04 0058) Pulse Rate:  [62-95] 94 (01/04 0324) Resp:  [16-31] 25 (01/04 0324) BP: (92-188)/(35-161) 136/53 mmHg (01/04 0058) SpO2:  [82 %-100 %] 100 % (01/04 0324) FiO2 (%):  [50 %] 50 % (01/04 0324) Weight:  [77.4 kg (170 lb 10.2 oz)-78.019 kg (172 lb)] 77.4 kg (170 lb 10.2 oz) (01/03 2030) HEMODYNAMICS:   VENTILATOR SETTINGS: Vent Mode:  [-] BIPAP FiO2 (%):  [50 %] 50 % Set Rate:  [15 bmp] 15 bmp PEEP:  [7 cmH20] 7 cmH20 Pressure Support:  [7 cmH20] 7 cmH20 INTAKE / OUTPUT: Intake/Output     01/03 0701 - 01/04 0700   I.V. (mL/kg) 40 (0.5)   IV Piggyback 100   Total Intake(mL/kg) 140 (1.8)   Net +140       Urine Occurrence 2 x     PHYSICAL EXAMINATION: General:  Sleepy but arousable, follows commands, able to speak and interact Neuro:  Has  minimal residual right sided weakness, speech slightly slurred, no facial droop, tongue midline HEENT:  Has BiPAP mask, atraumatic, no oral lesion, no nasal lesion, no facial tenderness, no LN, no stridor Cardiovascular:  RRR, no heave Lungs:  Decreased breath sounds RLL, no wheeze Abdomen:  Soft, nontender, no guarding Musculoskeletal:  No calf tenderness, no leg swelling, has an inversion contracture on her right ankle Skin:  No sacral ulcer, no acute rash  LABS:  CBC  Recent Labs Lab 05/24/13 0555 05/30/13 1415 05/30/13 1910  WBC 6.7 6.8 6.9  HGB 9.9* 10.3* 10.6*  HCT 31.7* 32.9* 33.7*  PLT 176 203 235   Coag's  Recent Labs Lab 05/24/13 0555 05/25/13 0728 05/30/13 1415  INR 1.83* 2.11* 1.43   BMET  Recent Labs Lab 05/24/13 0555 05/30/13 1415  NA 135 136*  K 4.4 4.5  CL 106 105  CO2  21 20  BUN 27* 22  CREATININE 1.88* 1.66*  GLUCOSE 107* 75   Electrolytes  Recent Labs Lab 05/24/13 0555 05/30/13 1415 05/30/13 1910  CALCIUM 8.2* 8.5  --   MG  --   --  2.0   Sepsis Markers No results found for this basename: LATICACIDVEN, PROCALCITON, O2SATVEN,  in the last 168 hours ABG  Recent Labs Lab 05/30/13 1711  PHART 7.341*  PCO2ART 35.4  PO2ART 83.0   Liver Enzymes  Recent Labs Lab 05/30/13 1910  AST 32  ALT 17  ALKPHOS 73  BILITOT 0.3  ALBUMIN 2.2*   Cardiac Enzymes  Recent Labs Lab 05/30/13 1415 05/30/13 1910 05/30/13 2305  TROPONINI <0.30 <0.30 <0.30   Glucose  Recent Labs Lab 05/30/13 1941 05/30/13 2141  GLUCAP 101* 92    Imaging Ct Head Wo Contrast  05/30/2013   CLINICAL DATA:  History of CVA.  EXAM: CT HEAD WITHOUT CONTRAST  TECHNIQUE: Contiguous axial images were obtained from the base of the skull through the vertex without intravenous contrast.  COMPARISON:  None.  FINDINGS: Examination demonstrates evidence of an old large left MCA territory infarct and left occipital infarct. There is chronic ischemic microvascular  disease present. The ventricles, cisterns and remaining CSF spaces are otherwise unremarkable. Possible old sub cm focal infarct over the right cerebellum. There is no mass, mass effect, shift of midline structures or acute hemorrhage. Remaining bones and soft tissues are unremarkable.  IMPRESSION: No acute intracranial findings.  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   Electronically Signed   By: Elberta Fortis M.D.   On: 05/30/2013 19:00   Ct Chest Wo Contrast  05/30/2013   CLINICAL DATA:  Recurrent pleural effusion.  Shortness of breath.  EXAM: CT CHEST WITHOUT CONTRAST  TECHNIQUE: Multidetector CT imaging of the chest was performed following the standard protocol without IV contrast.  COMPARISON:  04/26/2013  FINDINGS: Moderate right pleural effusion has increased in size since previous study. Small left pleural effusion shows no significant change.  Moderate cardiomegaly and tiny pericardial effusion are stable. Diffuse pulmonary interstitial infiltrates show no significant change, and are suspicious for diffuse interstitial edema or atypical infection. Compressive atelectasis is seen bilaterally related to the pleural effusions, but there is no evidence of focal pulmonary consolidation.  Shotty subcentimeter mediastinal lymph nodes are stable. Calcified bilateral hilar lymph nodes are again seen, consistent with old granulomatous disease. A benign left adrenal adenoma remains stable.  IMPRESSION: Stable cardiomegaly, tiny pericardial effusion, and diffuse interstitial infiltrates suspicious for diffuse interstitial edema or atypical infection.  Increased size of moderate right pleural effusion. Stable small left pleural effusion.  Stable benign left adrenal adenoma.   Electronically Signed   By: Myles Rosenthal M.D.   On: 05/30/2013 19:09   Dg Chest Portable 1 View  05/30/2013   CLINICAL DATA:  Shortness of breath.  CHF.  EXAM: PORTABLE CHEST - 1  VIEW  COMPARISON:  05/20/2013  FINDINGS: PICC line tip is in the left innominate vein region. Diffuse interstitial densities suggest pulmonary edema. Increased densities in the right lower chest could represent layering pleural fluid. Heart size appears to be upper limits of normal. No evidence for a pneumothorax.  IMPRESSION: Increased densities in the right lower chest may represent enlarging pleural effusion.  Interstitial pulmonary edema.  PICC line tip in the left innominate vein region.   Electronically Signed   By: Richarda Overlie M.D.   On:  05/30/2013 13:30     CT chest: Stable cardiomegaly, tiny pericardial effusion, and diffuse interstitial infiltrates suspicious for diffuse interstitial edema  or atypical infection. Increased size of moderate right pleural effusion. Stable small left pleural effusion. Stable benign left adrenal adenoma  ASSESSMENT / PLAN:  PULMONARY A: Recurrent Pleural effusion: this time transudative, very likely to be due to diastolic HF + fluid overload History of "COPD" P:   Patient stable on BiPAP. I gave her the option to either do thoracentesis or simply diuresis. She elected to have thoracentesis, which showed transudate Will stop Abx as her clinical history and pleural effusion findings are NOT compatible with infection. Nonetheless, follow up pleural fluid culture. Also follow up pleural fluid cytology. Needs aggressive diuresis and heart failure management Unfortunately I do not access to her PFT, unclear whether she truly has COPD. Will keep her on bronchodilators for now  CARDIOVASCULAR A: Likely diastolic heart failure causing pulmonary edema + effusion. History of AFib P:  Aggressive diuresis. Consider cardiology consult for heart failure management Continue Warfarin  RENAL A:  CKD P:   Avoid nephrotoxic meds  GASTROINTESTINAL A:   P:   Early PO nutrition when able  HEMATOLOGIC A:  Chronic anemia P:  Monitor Hb  INFECTIOUS A:  Unlikely  to have PNA P:   Will stop abx for now unless there is clinical change  ENDOCRINE A:  History of DM P:   Blood sugar control goal 140-180  NEUROLOGIC A:  Prior CVA, history of AFib P:   Agree with ASA and Warfarin  TODAY'S SUMMARY: has thoracentesis showing transudate. Abx stopped.  I have personally obtained a history, examined the patient, evaluated laboratory and imaging results, formulated the assessment and plan and placed orders. CRITICAL CARE: The patient is critically ill with multiple organ systems failure and requires high complexity decision making for assessment and support, frequent evaluation and titration of therapies, application of advanced monitoring technologies and extensive interpretation of multiple databases. Critical Care Time devoted to patient care services described in this note is 55 minutes.    Pulmonary and Critical Care Medicine [facility] Pager: [phone]  05/31/2013, 3:31 AM

## 2013-05-31 NOTE — Progress Notes (Signed)
[facility] Progress Note [place] TEAM 1 - Stepdown/ICU TEAM   Waymon AmatoShirley Nangle ZOX:[id number] DOB: November 03, 1940 DOA: 05/30/2013 PCP: Garlan FillersPATERSON,DANIEL G, MD  Brief narrative: 73 year old female with a history of recent hospitalization in [facility] in November 11/12, for pneumonia and parapneumonic effusion, presents to the ED with shortness of breath. She was transferred here from [facility]. She came in on a nonrebreather, with diminished breath sounds. She was found to have an O2 sat of 82% on room air. According to the patient's daughter-in-law the patient has been afebrile, but has developed progressive shortness of breath. Chest x-ray showed increased densities in the right lower lobe and enlarging right pleural effusion with pulmonary edema.  Per chart, pt has has problems with right sided effusions in the past and had a pleural drain in Nov 2013 which was removed [facility] 11[facility]. She eventually was able to go home.    Subjective: Feeling much better after thoracentesis. No complaints. Does admit the the previous drain that was placed was quite painful and she is hesitant to have it again.   Assessment/Plan: Principal Problem:   Acute respiratory failure due to  Acute diastolic heart failure with reccurent right pleural effusions - Flu negative - now on nasal cannula after thoracentesis and removal of 1100 cc of fluid consistent with transudate - Increase Lasix to 40 BID - low dose B Blocker while watching BP - have discussed placing a Pleurex vs continuing aggressive diuresis at home -patient and daughter are considering their options-  will need an outpt cardiologist and home health RN in either case   Active Problems:    Paroxysmal atrial fibrillation - resumed low dose of Metoprolol today- currently NSR and BP boderline low - coumadin per pharmacy- INR subtherapeutic   COPD - stable currently - PRN nebs  DM - low  dose sliding scale for now- follow sugars  CKD 3 - follow with diuresis  H/o CVA   Code Status: Full code  Family Communication: with daughter Disposition Plan: to be determined   Consultants: PCCM  Procedures: 1/4 - thoracentesis  Antibiotics: Primaxin 1/4 Vanc 1/4  DVT prophylaxis: Lovenox  Objective: Blood pressure 98/41, pulse 91, temperature 98.1 F (36.7 C), temperature source Oral, resp. rate 20, height 5\' 1"  (1.549 m), weight 77.4 kg (170 lb 10.2 oz), SpO2 97.00%.  Intake/Output Summary (Last 24 hours) at 05/31/13 1817 Last data filed at 05/31/13 1100  Gross per 24 hour  Intake    410 ml  Output      0 ml  Net    410 ml     Exam: General: No acute respiratory distress Lungs: crackles b/l lower lobes Cardiovascular: Regular rate and rhythm without murmur gallop or rub normal S1 and S2 Abdomen: Nontender, nondistended, soft, bowel sounds positive, no rebound, no ascites, no appreciable mass Extremities: No significant cyanosis, clubbing, or edema bilateral lower extremities  Data Reviewed: Basic Metabolic Panel:  Recent Labs Lab 05/30/13 1415 05/30/13 1910 05/31/13 0500  NA 136*  --  139  K 4.5  --  5.3  CL 105  --  108  CO2 20  --  17*  GLUCOSE 75  --  132*  BUN 22  --  28*  CREATININE 1.66*  --  2.08*  CALCIUM 8.5  --  8.2*  MG  --  2.0  --    Liver Function Tests:  Recent Labs Lab 05/30/13 1910 05/31/13 0500  AST 32 32  ALT 17 16  ALKPHOS 73 73  BILITOT 0.3 0.2*  PROT 7.4 7.6  ALBUMIN 2.2* 2.2*   No results found for this basename: LIPASE, AMYLASE,  in the last 168 hours No results found for this basename: AMMONIA,  in the last 168 hours CBC:  Recent Labs Lab 05/30/13 1415 05/30/13 1910 05/31/13 0500  WBC 6.8 6.9 6.5  NEUTROABS 4.8  --   --   HGB 10.3* 10.6* 10.4*  HCT 32.9* 33.7* 33.6*  MCV 83.1 83.2 83.2  PLT 203 235 217   Cardiac Enzymes:  Recent Labs Lab 05/30/13 1415 05/30/13 1910 05/30/13 2305  05/31/13 0433  TROPONINI <0.30 <0.30 <0.30 <0.30   BNP (last 3 results)  Recent Labs  05/06/13 0636 05/08/13 1054  PROBNP 11763.0* 17420.0*   CBG:  Recent Labs Lab 05/30/13 2141 05/31/13 0726 05/31/13 0846 05/31/13 1231 05/31/13 1717  GLUCAP 92 115* 131* 105* 167*    Recent Results (from the past 240 hour(s))  MRSA PCR SCREENING     Status: Abnormal   Collection Time    05/30/13  8:29 PM      Result Value Range Status   MRSA by PCR POSITIVE (*) NEGATIVE Final   Comment:            The GeneXpert MRSA Assay (FDA     approved for NASAL specimens     only), is one component of a     comprehensive MRSA colonization     surveillance program. It is not     intended to diagnose MRSA     infection nor to guide or     monitor treatment for     MRSA infections.     RESULT CALLED TO, READ BACK BY AND VERIFIED WITH:     PETTIFORD,A RN [id number] AT 2148 SKEEN,P     Studies:  Recent x-ray studies have been reviewed in detail by the Attending Physician  Scheduled Meds:  Scheduled Meds: . albuterol  5 mg Nebulization Once  . aspirin  81 mg Oral Daily  . Chlorhexidine Gluconate Cloth  6 each Topical Q0600  . clonazePAM  0.5 mg Oral BID  . enoxaparin (LOVENOX) injection  30 mg Subcutaneous Q24H  . furosemide  40 mg Intravenous BID  . insulin aspart  0-9 Units Subcutaneous TID WC  . ipratropium  0.5 mg Nebulization TID  . levalbuterol  0.63 mg Nebulization TID  . methylPREDNISolone (SOLU-MEDROL) injection  125 mg Intravenous STAT  . metoprolol tartrate  12.5 mg Oral BID  . mupirocin ointment  1 application Nasal BID  . nitroGLYCERIN  2-200 mcg/min Intravenous Once  . [START ON 06/01/2013] oseltamivir  30 mg Oral Daily  . [START ON 06/01/2013] pneumococcal 23 valent vaccine  0.5 mL Intramuscular Tomorrow-1000  . potassium chloride  10 mEq Oral Daily  . sodium chloride  3 mL Intravenous Q12H  . warfarin  2 mg Oral Once  . warfarin  2.25 mg Oral ONCE-1800  . Warfarin -  Pharmacist Dosing Inpatient   Does not apply q1800   Continuous Infusions:   Time spent on care of this patient: >35 min   Calvert Cantor, MD  [facility] Office  [phone] Pager - Text Page per Loretha Stapler as per below:  On-Call/Text Page:      [email or web address]      password [email or web address]  If 7PM-7AM, please contact night-coverage [email or web address] Password [email or web address] 05/31/2013, 6:17 PM   LOS: 1 day

## 2013-05-31 NOTE — Progress Notes (Addendum)
ANTICOAGULATION CONSULT NOTE - Follow Up Consult  Pharmacy Consult for Coumadin Indication: atrial fibrillation  Allergies  Allergen Reactions  . Baclofen   . Clindamycin/Lincomycin     Patient Measurements: Height: 5\' 1"  (154.9 cm) Weight: 170 lb 10.2 oz (77.4 kg) IBW/kg (Calculated) : 47.8  Vital Signs: Temp: 98 F (36.7 C) (01/04 0502) Temp src: Axillary (01/04 0502) BP: 133/79 mmHg (01/04 0502) Pulse Rate: 90 (01/04 0502)  Labs:  Recent Labs  05/30/13 1415 05/30/13 1910 05/30/13 2305 05/31/13 0433 05/31/13 0500  HGB 10.3* 10.6*  --   --  10.4*  HCT 32.9* 33.7*  --   --  33.6*  PLT 203 235  --   --  217  LABPROT 17.1*  --   --   --  16.8*  INR 1.43  --   --   --  1.40  CREATININE 1.66*  --   --   --  2.08*  TROPONINI <0.30 <0.30 <0.30 <0.30  --     Estimated Creatinine Clearance: 23.3 ml/min (by C-G formula based on Cr of 2.08).   Medications:  Scheduled:  . albuterol  5 mg Nebulization Once  . aspirin  81 mg Oral Daily  . Chlorhexidine Gluconate Cloth  6 each Topical Q0600  . clonazePAM  0.5 mg Oral BID  . enoxaparin (LOVENOX) injection  30 mg Subcutaneous Q24H  . furosemide  20 mg Intravenous BID  . insulin aspart  0-9 Units Subcutaneous TID WC  . ipratropium  0.5 mg Nebulization TID  . levalbuterol  0.63 mg Nebulization TID  . methylPREDNISolone (SOLU-MEDROL) injection  125 mg Intravenous STAT  . metoprolol tartrate  12.5 mg Oral BID  . mupirocin ointment  1 application Nasal BID  . nitroGLYCERIN  2-200 mcg/min Intravenous Once  . oseltamivir  75 mg Oral Daily  . [START ON 06/01/2013] pneumococcal 23 valent vaccine  0.5 mL Intramuscular Tomorrow-1000  . potassium chloride  10 mEq Oral Daily  . sodium chloride  3 mL Intravenous Q12H  . warfarin  2 mg Oral Once  . Warfarin - Pharmacist Dosing Inpatient   Does not apply q1800   Assessment: 73 [facility] F [facility] and parapneumonic effusion presented to ED  with SOB. Patient on Coumadin PTA for afib and history of CVA (Coumadin PTA dose: 1.5mg  PO daily).  Last night's admission INR was subtherapeutic at 1.43.  After one dose of 2mg  given at that time, this morning's INR is about the same at 1.4 (likely not seeing effect yet, tomorrow's INR will be more telling of response).  H/H is low but stable, and Plt are wnl. No reports of bleeding noted.  Now s/p thorocentesis and plans for aggressive diuresis, continue Coumadin per CCM note.  Goal of Therapy:  INR 2-3 Monitor platelets by anticoagulation protocol: Yes   Plan:  - give Coumadin 2.25mg  x1 dose tonight - daily INR - monitor for s/s of bleeding  Harrold DonathNathan E. Achilles Dunkope, PharmD Clinical Pharmacist - Resident Pager: [phone] Pharmacy: [phone] 05/31/2013 8:07 AM   Addendum ========================== Plan to discontinue Lovenox sq when The St. Paul TravelersNR>2  Faline Langer E. Achilles Dunkope, PharmD Clinical Pharmacist - Resident Pager: [phone] Pharmacy: [phone] 05/31/2013 8:14 AM

## 2013-06-01 DIAGNOSIS — I5031 Acute diastolic (congestive) heart failure: Secondary | ICD-10-CM

## 2013-06-01 LAB — BASIC METABOLIC PANEL
BUN: 41 mg/dL — AB (ref 6–23)
CHLORIDE: 107 meq/L (ref 96–112)
CO2: 19 mEq/L (ref 19–32)
Calcium: 7.7 mg/dL — ABNORMAL LOW (ref 8.4–10.5)
Creatinine, Ser: 2.51 mg/dL — ABNORMAL HIGH (ref 0.50–1.10)
GFR calc non Af Amer: 18 mL/min — ABNORMAL LOW (ref >90)
GFR, EST AFRICAN AMERICAN: 21 mL/min — AB (ref >90)
Glucose, Bld: 105 mg/dL — ABNORMAL HIGH (ref 70–99)
POTASSIUM: 4.8 meq/L (ref 3.7–5.3)
Sodium: 140 mEq/L (ref 137–147)

## 2013-06-01 LAB — GLUCOSE, CAPILLARY
GLUCOSE-CAPILLARY: 108 mg/dL — AB (ref 70–99)
GLUCOSE-CAPILLARY: 117 mg/dL — AB (ref 70–99)
Glucose-Capillary: 191 mg/dL — ABNORMAL HIGH (ref 70–99)

## 2013-06-01 LAB — PROTIME-INR
INR: 1.69 — AB (ref 0.00–1.49)
Prothrombin Time: 19.4 seconds — ABNORMAL HIGH (ref 11.6–15.2)

## 2013-06-01 MED ORDER — WARFARIN SODIUM 2.5 MG PO TABS
2.5000 mg | ORAL_TABLET | Freq: Once | ORAL | Status: AC
Start: 2013-06-01 — End: 2013-06-01
  Administered 2013-06-01: 2.5 mg via ORAL
  Filled 2013-06-01: qty 1

## 2013-06-01 MED ORDER — SODIUM CHLORIDE 0.9 % IV BOLUS (SEPSIS)
250.0000 mL | Freq: Once | INTRAVENOUS | Status: AC
  Administered 2013-06-01: 250 mL via INTRAVENOUS

## 2013-06-01 MED ORDER — LEVALBUTEROL HCL 0.63 MG/3ML IN NEBU: 0.6300 mg | INHALATION_SOLUTION | RESPIRATORY_TRACT | Status: DC | PRN

## 2013-06-01 NOTE — Progress Notes (Signed)
ANTICOAGULATION CONSULT NOTE - Follow Up Consult  Pharmacy Consult for Coumadin Indication: atrial fibrillation  Allergies  Allergen Reactions  . Baclofen   . Clindamycin/Lincomycin     Patient Measurements: Height: 5\' 1"  (154.9 cm) Weight: 175 lb 7.8 oz (79.6 kg) IBW/kg (Calculated) : 47.8  Vital Signs: Temp: 97.7 F (36.5 C) (01/05 0747) Temp src: Oral (01/05 0747) BP: 111/49 mmHg (01/05 0747) Pulse Rate: 80 (01/05 0747)  Labs:  Recent Labs  05/30/13 1415 05/30/13 1910 05/30/13 2305 05/31/13 0433 05/31/13 0500 06/01/13 0500  HGB 10.3* 10.6*  --   --  10.4*  --   HCT 32.9* 33.7*  --   --  33.6*  --   PLT 203 235  --   --  217  --   LABPROT 17.1*  --   --   --  16.8* 19.4*  INR 1.43  --   --   --  1.40 1.69*  CREATININE 1.66*  --   --   --  2.08* 2.51*  TROPONINI <0.30 <0.30 <0.30 <0.30  --   --     Estimated Creatinine Clearance: 19.6 ml/min (by C-G formula based on Cr of 2.51).  Assessment: 72yof on coumadin pta for afib/CVA, admitted with SOB and found to have right pleural effusion. Underwent thoracentesis yesterday and coumadin resumed last night. INR remains subtherapeutic today, but trending up. CBC is stable. No bleeding reported.  Goal of Therapy:  INR 2-3 Monitor platelets by anticoagulation protocol: Yes   Plan:  1) Coumadin 2.5mg  x 1 tonight 2) INR in AM  Fredrik RiggerMarkle, Carling Liberman Sue 06/01/2013,8:27 AM

## 2013-06-01 NOTE — Progress Notes (Signed)
[facility] and pt transferred to [id number] via bed, O2, nurse, and NT. VS WNL. Call bell placed beside pt.

## 2013-06-01 NOTE — Progress Notes (Signed)
Paged Dr.Mclung, on pt BP sys<90 trending . Dr.Mcclung d/c lasix and gave order for 250cc bolus. Pt BP increased to >90 sys. Will continue to monitor. Reported status off to oncoming RN.

## 2013-06-01 NOTE — Progress Notes (Signed)
Pt arrived to unit via stretcher. Pt was oriented to room and call bell system. Pt in no apparent distress at this time. rn will continue to monitor pt.

## 2013-06-01 NOTE — Progress Notes (Signed)
Name: Ariana [name] MRN: [id number] DOB: 16-Jul-1940    ADMISSION DATE:  05/30/2013 CONSULTATION DATE:  05/30/13  REFERRING MD :  Dr. Susie Cassette  CHIEF COMPLAINT:  Dyspnea  BRIEF PATIENT DESCRIPTION: 61 F with history of diastolic heart failure now presenting with recurrent pleural effusion, this time transudative, clinically very consistent with effusion due to heart failure  SIGNIFICANT EVENTS / STUDIES:  Thoracentesis done 05/31/13, fluid analysis consistent with transudate  LINES / TUBES:   CULTURES: Pleural fluid culture pending  ANTIBIOTICS: Primaxin > stopped 05/31/13 Vancomycin > stopped 05/31/13   SUBJECTIVE:  No acute events, no complaints  VITAL SIGNS: Temp:  [97.7 F (36.5 C)-98.3 F (36.8 C)] 97.7 F (36.5 C) (01/05 1205) Pulse Rate:  [73-91] 80 (01/05 0747) Resp:  [16-22] 18 (01/05 0747) BP: (106-116)/(42-71) 111/49 mmHg (01/05 0747) SpO2:  [95 %-100 %] 100 % (01/05 1358) Weight:  [79.6 kg (175 lb 7.8 oz)] 79.6 kg (175 lb 7.8 oz) (01/05 0437) HEMODYNAMICS:   VENTILATOR SETTINGS:   INTAKE / OUTPUT: Intake/Output     01/04 0701 - 01/05 0700 01/05 0701 - 01/06 0700   P.O. 240    I.V. (mL/kg) 320 (4) 80 (1)   IV Piggyback     Total Intake(mL/kg) 560 (7) 80 (1)   Net +560 +80        Urine Occurrence 2 x 1 x     PHYSICAL EXAMINATION: General: awake, comfortable HEENT: NCAT, EOMi PULM: CTA B CV: irreg rhythm, s1/s2 AB: BS+, soft Ext: warm, trace edema   LABS:  CBC  Recent Labs Lab 05/30/13 1415 05/30/13 1910 05/31/13 0500  WBC 6.8 6.9 6.5  HGB 10.3* 10.6* 10.4*  HCT 32.9* 33.7* 33.6*  PLT 203 235 217   Coag's  Recent Labs Lab 05/30/13 1415 05/31/13 0500 06/01/13 0500  INR 1.43 1.40 1.69*   BMET  Recent Labs Lab 05/30/13 1415 05/31/13 0500 06/01/13 0500  NA 136* 139 140  K 4.5 5.3 4.8  CL 105 108 107  CO2 20 17* 19  BUN 22 28* 41*  CREATININE 1.66* 2.08* 2.51*  GLUCOSE 75 132* 105*   Electrolytes  Recent Labs Lab  05/30/13 1415 05/30/13 1910 05/31/13 0500 06/01/13 0500  CALCIUM 8.5  --  8.2* 7.7*  MG  --  2.0  --   --    Sepsis Markers No results found for this basename: LATICACIDVEN, PROCALCITON, O2SATVEN,  in the last 168 hours ABG  Recent Labs Lab 05/30/13 1711  PHART 7.341*  PCO2ART 35.4  PO2ART 83.0   Liver Enzymes  Recent Labs Lab 05/30/13 1910 05/31/13 0500  AST 32 32  ALT 17 16  ALKPHOS 73 73  BILITOT 0.3 0.2*  ALBUMIN 2.2* 2.2*   Cardiac Enzymes  Recent Labs Lab 05/30/13 1910 05/30/13 2305 05/31/13 0433  TROPONINI <0.30 <0.30 <0.30   Glucose  Recent Labs Lab 05/31/13 0846 05/31/13 1231 05/31/13 1717 05/31/13 2236 06/01/13 0745 06/01/13 1120  GLUCAP 131* 105* 167* 189* 108* 191*    Imaging    CT chest: Stable cardiomegaly, tiny pericardial effusion, and diffuse interstitial infiltrates suspicious for diffuse interstitial edema  or atypical infection. Increased size of moderate right pleural effusion. Stable small left pleural effusion. Stable benign left adrenal adenoma  ASSESSMENT / PLAN:  PULMONARY A: Recurrent transudative pleural effusion most likely due to CHF; best approach at this point is continued diuresis P:   -would continue to manage this with diuresis as per managing CHF  CARDIOVASCULAR A: Likely  diastolic heart failure causing pulmonary edema + effusion. History of AFib P:  Diuresis per primary team  I discussed the situation with the patient's daughter in law.  This is a transudative effusion due to CHF.  She inquired about chest tube drainage but I advised against as the best approach is diuresis.  PCCM to sign off, call if questions  Yolonda KidaMCQUAID, DOUGLAS [place] [facility] Pager: [phone] Cell: [phone] If no response, call [phone]  06/01/2013, 2:18 PM

## 2013-06-01 NOTE — Progress Notes (Signed)
[facility] Progress Note [place] TEAM 1 - Stepdown/ICU TEAM   Waymon AmatoShirley Kinoshita [id number] DOB: Jan 26, 1941 DOA: 05/30/2013 PCP: Garlan FillersPATERSON,DANIEL G, MD  Brief narrative: 73 year old female with a history of recent hospitalization at [facility] in November for pneumonia and parapneumonic effusion who presented to the ED with shortness of breath. She was transferred from [facility]. She came in on a nonrebreather, with diminished breath sounds. She was found to have an O2 sat of 82% on room air. According to the patient's daughter-in-law the patient had been afebrile, but had developed progressive shortness of breath. Chest x-ray showed increased densities in the right lower lobe and an enlarging right pleural effusion with pulmonary edema.  Per chart pt has had problems with right sided effusions in the past and had a pleural drain in Nov 2013 which was subsequently removed while at [facility].   Subjective: Pt is quite somnolent, but states that her breathing has improved overall.  She denies cp, n/v, or abdom pain.  She states she wears Oxygen at 2lpm at home at all times.    Assessment/Plan:  Acute respiratory failure due to  Acute diastolic heart failure with reccurent right pleural effusions - Flu negative - now on nasal cannula after thoracentesis and removal of 1100 cc of fluid consistent with transudate - diastolic CHF is felt to be the primary mechanism  - Increase Lasix to 40 BID - low dose B Blocker while watching BP - Pleurex is not currently felt to be indicated per PCCM   Paroxysmal atrial fibrillation - resumed low dose Metoprolol - currently NSR and BP now stable  - chronic coumadin (with acute dosing per pharmacy) - INR not yet at goal   COPD - stable currently - pt reports home O2 at 2lpm at at all times  - PRN nebs - wean to home O2 dose   DM - low dose sliding scale for now - CBG reasonable   CKD 3 - crt is  climbing w/ diuresis - this may be a diuresis limiting factor - follow renal fxn w/ ongoing diuresis   H/o CVA  MRSA screen + Usual precautions   Code Status: FULL Family Communication: no family present at time of exam today  Disposition Plan: stable for transfer to medical bed - f/u CXR in AM to reassess effusion size - follow renal fxn w/ diuresis and determine optimum dose for d/c home - begin to ambulate - wean O2 as able   Consultants: PCCM  Procedures: 1/04 - thoracentesis >> transudate  Antibiotics: Primaxin 1/4 Vanc 1/4  DVT prophylaxis: Lovenox until warfarin therapeutic   Objective: Blood pressure 111/49, pulse 80, temperature 97.7 F (36.5 C), temperature source Oral, resp. rate 18, height 5\' 1"  (1.549 m), weight 79.6 kg (175 lb 7.8 oz), SpO2 100.00%.  Intake/Output Summary (Last 24 hours) at 06/01/13 1538 Last data filed at 06/01/13 1000  Gross per 24 hour  Intake    560 ml  Output      0 ml  Net    560 ml   Exam: General: mild resp distress noted w/ tachypnea even at rest  Lungs:  Poor air movement B bases - no wheeze  Cardiovascular: Regular rate and rhythm without murmur gallop or rub  Abdomen: Nontender, nondistended, soft, bowel sounds positive, no rebound, no ascites, no appreciable mass Extremities: No significant cyanosis, clubbing, or edema bilateral lower extremities  Data Reviewed: Basic Metabolic Panel:  Recent Labs Lab 05/30/13 1415 05/30/13  1910 05/31/13 0500 06/01/13 0500  NA 136*  --  139 140  K 4.5  --  5.3 4.8  CL 105  --  108 107  CO2 20  --  17* 19  GLUCOSE 75  --  132* 105*  BUN 22  --  28* 41*  CREATININE 1.66*  --  2.08* 2.51*  CALCIUM 8.5  --  8.2* 7.7*  MG  --  2.0  --   --    Liver Function Tests:  Recent Labs Lab 05/30/13 1910 05/31/13 0500  AST 32 32  ALT 17 16  ALKPHOS 73 73  BILITOT 0.3 0.2*  PROT 7.4 7.6  ALBUMIN 2.2* 2.2*   CBC:  Recent Labs Lab 05/30/13 1415 05/30/13 1910 05/31/13 0500  WBC  6.8 6.9 6.5  NEUTROABS 4.8  --   --   HGB 10.3* 10.6* 10.4*  HCT 32.9* 33.7* 33.6*  MCV 83.1 83.2 83.2  PLT 203 235 217   Cardiac Enzymes:  Recent Labs Lab 05/30/13 1415 05/30/13 1910 05/30/13 2305 05/31/13 0433  TROPONINI <0.30 <0.30 <0.30 <0.30   BNP (last 3 results)  Recent Labs  05/06/13 0636 05/08/13 1054  PROBNP 11763.0* 17420.0*   CBG:  Recent Labs Lab 05/31/13 1231 05/31/13 1717 05/31/13 2236 06/01/13 0745 06/01/13 1120  GLUCAP 105* 167* 189* 108* 191*    Recent Results (from the past 240 hour(s))  MRSA PCR SCREENING     Status: Abnormal   Collection Time    05/30/13  8:29 PM      Result Value Range Status   MRSA by PCR POSITIVE (*) NEGATIVE Final   Comment:            The GeneXpert MRSA Assay (FDA     approved for NASAL specimens     only), is one component of a     comprehensive MRSA colonization     surveillance program. It is not     intended to diagnose MRSA     infection nor to guide or     monitor treatment for     MRSA infections.     RESULT CALLED TO, READ BACK BY AND VERIFIED WITH:     PETTIFORD,A RN [phone] AT 2148 SKEEN,P  CULTURE, BLOOD (ROUTINE X 2)     Status: None   Collection Time    05/31/13  1:15 AM      Result Value Range Status   Specimen Description BLOOD RIGHT ARM   Final   Special Requests BOTTLES DRAWN AEROBIC ONLY 4CC   Final   Culture  Setup Time     Final   Value: 05/31/2013 13:48     Performed at [facility]   Culture     Final   Value:        BLOOD CULTURE RECEIVED NO GROWTH TO DATE CULTURE WILL BE HELD FOR 5 DAYS BEFORE ISSUING A FINAL NEGATIVE REPORT     Performed at [facility]   Report Status PENDING   Incomplete  CULTURE, BLOOD (ROUTINE X 2)     Status: None   Collection Time    05/31/13  1:24 AM      Result Value Range Status   Specimen Description BLOOD RIGHT HAND   Final   Special Requests BOTTLES DRAWN AEROBIC ONLY [facility]   Final   Culture  Setup Time     Final   Value: 05/31/2013  13:48     Performed at [facility]  Culture     Final   Value:        BLOOD CULTURE RECEIVED NO GROWTH TO DATE CULTURE WILL BE HELD FOR 5 DAYS BEFORE ISSUING A FINAL NEGATIVE REPORT     Performed at [facility]   Report Status PENDING   Incomplete  BODY FLUID CULTURE     Status: None   Collection Time    05/31/13  3:34 AM      Result Value Range Status   Specimen Description FLUID PLEURAL   Final   Special Requests NONE   Final   Gram Stain     Final   Value: CYTOSPIN SLIDE WBC PRESENT,BOTH PMN AND MONONUCLEAR     NO ORGANISMS SEEN     Performed at [facility]   Culture     Final   Value: NO GROWTH 1 DAY     Performed at [facility]   Report Status PENDING   Incomplete     Studies:  Recent x-ray studies have been reviewed in detail by the Attending Physician  Scheduled Meds:  Scheduled Meds: . albuterol  5 mg Nebulization Once  . aspirin  81 mg Oral Daily  . Chlorhexidine Gluconate Cloth  6 each Topical Q0600  . clonazePAM  0.5 mg Oral BID  . enoxaparin (LOVENOX) injection  30 mg Subcutaneous Q24H  . furosemide  40 mg Intravenous BID  . insulin aspart  0-9 Units Subcutaneous TID WC  . levalbuterol  0.63 mg Nebulization TID  . metoprolol tartrate  12.5 mg Oral BID  . mupirocin ointment  1 application Nasal BID  . nitroGLYCERIN  2-200 mcg/min Intravenous Once  . potassium chloride  10 mEq Oral Daily  . sodium chloride  3 mL Intravenous Q12H  . warfarin  2.5 mg Oral ONCE-1800  . Warfarin - Pharmacist Dosing Inpatient   Does not apply q1800   Time spent on care of this patient: 35 mins   [facility] T, MD  [facility] Office  [phone] Pager - Text Page per Amion as per below:  On-Call/Text Page:      [email or web address]      password [email or web address]  If 7PM-7AM, please contact night-coverage [email or web address] Password [email or web address] 06/01/2013, 3:38 PM   LOS: 2 days

## 2013-06-02 ENCOUNTER — Inpatient Hospital Stay (HOSPITAL_COMMUNITY): Payer: Medicare Other

## 2013-06-02 DIAGNOSIS — R0609 Other forms of dyspnea: Secondary | ICD-10-CM

## 2013-06-02 DIAGNOSIS — Z7901 Long term (current) use of anticoagulants: Secondary | ICD-10-CM

## 2013-06-02 DIAGNOSIS — I4891 Unspecified atrial fibrillation: Secondary | ICD-10-CM

## 2013-06-02 DIAGNOSIS — R0989 Other specified symptoms and signs involving the circulatory and respiratory systems: Secondary | ICD-10-CM

## 2013-06-02 LAB — BASIC METABOLIC PANEL
BUN: 36 mg/dL — ABNORMAL HIGH (ref 6–23)
CALCIUM: 7.9 mg/dL — AB (ref 8.4–10.5)
CO2: 20 mEq/L (ref 19–32)
CREATININE: 2.02 mg/dL — AB (ref 0.50–1.10)
Chloride: 108 mEq/L (ref 96–112)
GFR calc non Af Amer: 24 mL/min — ABNORMAL LOW (ref >90)
GFR, EST AFRICAN AMERICAN: 27 mL/min — AB (ref >90)
Glucose, Bld: 113 mg/dL — ABNORMAL HIGH (ref 70–99)
Potassium: 4.3 mEq/L (ref 3.7–5.3)
Sodium: 138 mEq/L (ref 137–147)

## 2013-06-02 LAB — GLUCOSE, CAPILLARY
Glucose-Capillary: 119 mg/dL — ABNORMAL HIGH (ref 70–99)
Glucose-Capillary: 99 mg/dL (ref 70–99)
Glucose-Capillary: 129 mg/dL — ABNORMAL HIGH (ref 70–99)
Glucose-Capillary: 96 mg/dL (ref 70–99)
Glucose-Capillary: 133 mg/dL — ABNORMAL HIGH (ref 70–99)

## 2013-06-02 LAB — PROTIME-INR
INR: 2.08 — AB (ref 0.00–1.49)
PROTHROMBIN TIME: 22.7 s — AB (ref 11.6–15.2)

## 2013-06-02 MED ORDER — OXYCODONE HCL 5 MG PO TABS
5.0000 mg | ORAL_TABLET | ORAL | Status: DC | PRN
  Administered 2013-06-02 – 2013-06-03 (×2): 5 mg via ORAL
  Filled 2013-06-02 (×2): qty 1

## 2013-06-02 MED ORDER — WARFARIN SODIUM 1 MG PO TABS
1.5000 mg | ORAL_TABLET | Freq: Once | ORAL | Status: AC
  Administered 2013-06-02: 18:00:00 1.5 mg via ORAL
  Filled 2013-06-02: qty 1

## 2013-06-02 MED ORDER — FUROSEMIDE 40 MG PO TABS
40.0000 mg | ORAL_TABLET | Freq: Every day | ORAL | Status: DC
  Administered 2013-06-02 – 2013-06-03 (×2): 40 mg via ORAL
  Filled 2013-06-02 (×4): qty 1

## 2013-06-02 NOTE — Evaluation (Signed)
Physical Therapy Evaluation Patient Details Name: Ariana [name] MRN: [id number] DOB: Aug 16, 1940 Today's Date: 06/02/2013 Time: 4098-1191 PT Time Calculation (min): 11 min  PT Assessment / Plan / Recommendation History of Present Illness  73 year old female with a history of recent hospitalization at [facility] in November for pneumonia and parapneumonic effusion who presented to the ED with shortness of breath. She was transferred from [facility]. Chest x-ray showed increased densities in the right lower lobe and an enlarging right pleural effusion with pulmonary edema.  Clinical Impression  Pt admitted with above. Pt currently with functional limitations due to the deficits listed below (see PT Problem List).  Pt will benefit from skilled PT to increase their independence and safety with mobility to allow discharge to the venue listed below.       PT Assessment  Patient needs continued PT services    Follow Up Recommendations  SNF    Does the patient have the potential to tolerate intense rehabilitation      Barriers to Discharge        Equipment Recommendations  None recommended by PT    Recommendations for Other Services     Frequency Min 2X/week    Precautions / Restrictions Precautions Precautions: Fall   Pertinent Vitals/Pain Pt on O2.      Mobility  Bed Mobility Bed Mobility: Left Sidelying to Sit;Sitting - Scoot to Edge of Bed;Sit to Sidelying Left Left Sidelying to Sit: 4: Min assist;HOB elevated Sitting - Scoot to Edge of Bed: 4: Min assist Sit to Sidelying Left: 4: Min assist Details for Bed Mobility Assistance: Assist to manage trunk. Transfers Transfers: Sit to Stand;Stand to Sit Sit to Stand: 3: Mod assist;With upper extremity assist;From bed Stand to Sit: 3: Mod assist;With upper extremity assist;To bed Details for Transfer Assistance: Pt held my bil forearms.  Assist to bring hips up.    Exercises     PT Diagnosis:  Hemiplegia dominant side  PT Problem List: Decreased strength;Decreased mobility PT Treatment Interventions: Functional mobility training;Therapeutic activities;Balance training;Therapeutic exercise;Patient/family education     PT Goals(Current goals can be found in the care plan section) Acute Rehab PT Goals Patient Stated Goal: Pt unable to state due to aphasia. Agreeable to working toward incr mobility. PT Goal Formulation: With patient Time For Goal Achievement: 06/09/13 Potential to Achieve Goals: Good  Visit Information  Last PT Received On: 06/02/13 Assistance Needed: +1 History of Present Illness: 73 year old female with a history of recent hospitalization at [facility] in November for pneumonia and parapneumonic effusion who presented to the ED with shortness of breath. She was transferred from [facility] nursing home. Chest x-ray showed increased densities in the right lower lobe and an enlarging right pleural effusion with pulmonary edema.       Prior Functioning  Home Living Family/patient expects to be discharged to:: Skilled nursing facility Prior Function Level of Independence: Needs assistance Gait / Transfers Assistance Needed: Nonambulatory but transfers from bed to chair with 1 person assist.    Cognition  Cognition Arousal/Alertness: Awake/alert Behavior During Therapy: [facility] for tasks assessed/performed Overall Cognitive Status: Difficult to assess Difficult to assess due to: Impaired communication    Extremity/Trunk Assessment Upper Extremity Assessment Upper Extremity Assessment: Defer to OT evaluation Lower Extremity Assessment Lower Extremity Assessment: RLE deficits/detail RLE Deficits / Details: ankle 0/5, knee 3-/5, hip 3-/5   Balance Balance Balance Assessed: Yes Static Sitting Balance Static Sitting - Balance Support: Bilateral upper extremity supported Static Sitting - Level  of Assistance: 5: Stand by assistance Static Sitting -  Comment/# of Minutes: Sat EOB x 8minutes Static Standing Balance Static Standing - Balance Support: Bilateral upper extremity supported Static Standing - Level of Assistance: 3: Mod assist Static Standing - Comment/# of Minutes: Stood x 20 seconds  End of Session PT - End of Session Activity Tolerance: Patient tolerated treatment well Patient left: in bed;with call bell/phone within reach;with bed alarm set Nurse Communication: Mobility status  GP     Ladajah Soltys 06/02/2013, 1:47 PM  [employer] Alieah Brinton PT [phone]

## 2013-06-02 NOTE — Care Management Note (Unsigned)
    Page 1 of 1   06/02/2013     4:43:48 PM   CARE MANAGEMENT NOTE 06/02/2013  Patient:  Ariana [name],Ariana [name]   Account Number:  [id number]  Date Initiated:  06/02/2013  Documentation initiated by:  Letha CapeAYLOR,Loribeth Katich  Subjective/Objective Assessment:   dx acute resp failure  admit- from [facility].     Action/Plan:   Anticipated DC Date:  06/03/2013   Anticipated DC Plan:  SKILLED NURSING FACILITY  In-house referral  Clinical Social Worker      DC Planning Services  CM consult      Choice offered to / List presented to:             Status of service:  In process, will continue to follow Medicare Important Message given?   (If response is "NO", the following Medicare IM given date fields will be blank) Date Medicare IM given:   Date Additional Medicare IM given:    Discharge Disposition:    Per UR Regulation:    If discussed at Long Length of Stay Meetings, dates discussed:    Comments:  06/02/13 16:43 Letha Capeeborah Ramsey Guadamuz RN, BSN [phone] patient is from [facility], CSW following.

## 2013-06-02 NOTE — Progress Notes (Signed)
ANTICOAGULATION CONSULT NOTE - Follow Up Consult  Pharmacy Consult for coumadin Indication: atrial fibrillation  Allergies  Allergen Reactions  . Baclofen   . Clindamycin/Lincomycin     Patient Measurements: Height: 5\' 1"  (154.9 cm) Weight: 167 lb 12.3 oz (76.1 kg) IBW/kg (Calculated) : 47.8   Vital Signs: Temp: 97.8 F (36.6 C) (01/06 0635) Temp src: Oral (01/06 0635) BP: 164/81 mmHg (01/06 0635) Pulse Rate: 74 (01/06 0635)  Labs:  Recent Labs  05/30/13 1415 05/30/13 1910 05/30/13 2305 05/31/13 0433 05/31/13 0500 06/01/13 0500  HGB 10.3* 10.6*  --   --  10.4*  --   HCT 32.9* 33.7*  --   --  33.6*  --   PLT 203 235  --   --  217  --   LABPROT 17.1*  --   --   --  16.8* 19.4*  INR 1.43  --   --   --  1.40 1.69*  CREATININE 1.66*  --   --   --  2.08* 2.51*  TROPONINI <0.30 <0.30 <0.30 <0.30  --   --     Estimated Creatinine Clearance: 19.2 ml/min (by C-G formula based on Cr of 2.51).  Assessment: Patient is a 73 y.o F on coumadin PTA for afib/CVA and s/p thoracentesis for pleural effusion on 1/04.   INR is therapeutic at 2.08.  No bleeding documented.  Goal of Therapy:  INR 2-3   Plan:  1) coumadin 1.5mg  PO x1 today  Khian Remo P 06/02/2013,10:21 AM

## 2013-06-02 NOTE — Clinical Social Work Psychosocial (Signed)
Clinical Social Work Department BRIEF PSYCHOSOCIAL ASSESSMENT 06/02/2013  Patient:  Ariana [name],Ariana [name]     Account Number:  [id number]     Admit date:  05/30/2013  Clinical Social Worker:  Lovey Newcomer  Date/Time:  06/02/2013 07:50 PM  Referred by:  Physician  Date Referred:  06/02/2013 Referred for  SNF Placement   Other Referral:   Interview type:  Family Other interview type:   Patient alert and oriented at time of assessment, but experiencing aphasia. Patient asked that CSW call son.    PSYCHOSOCIAL DATA Living Status:  FACILITY Admitted from facility:  [facility] Level of care:  [place] Primary support name:  Ariana [name] Primary support relationship to patient:  CHILD, ADULT Degree of support available:   Support is good.    CURRENT CONCERNS Current Concerns  Post-Acute Placement   Other Concerns:    SOCIAL WORK ASSESSMENT / PLAN CSW met with patient at bedside. Patient states she is from [place]. CSW contacted son to gather more information. Son confirms that patient is from [facility] and states that plan is for her to return at DC. CSW explained CSW role in DC process and answered son's questions. CSW will continue to follow.   Assessment/plan status:  Psychosocial Support/Ongoing Assessment of Needs Other assessment/ plan:   Complete FL2, Fax   Information/referral to community resources:   CSW contact information given to son.    PATIENT'S/FAMILY'S RESPONSE TO PLAN OF CARE: Patient and son agreeable to return to [facility] upon DC. Patient and son were both pleasant, appropriate, and appreciative of CSW contact. CSW will assist with DC.       Liz Beach, [place], [address], [phone]

## 2013-06-02 NOTE — Progress Notes (Addendum)
[facility] Progress Note [place] TEAM 1 - Stepdown/ICU TEAM   Arena Lindahl ZOX:[id number] DOB: [birth year] DOA: 05/30/2013 PCP: Garlan Fillers, MD  Brief narrative: 73 year old female with a history of recent hospitalization at [facility] in November for pneumonia and parapneumonic effusion who presented to the ED with shortness of breath. She was transferred from [facility]. She came in on a nonrebreather, with diminished breath sounds. She was found to have an O2 sat of 82% on room air. According to the patient's daughter-in-law the patient had been afebrile, but had developed progressive shortness of breath. Chest x-ray showed increased densities in the right lower lobe and an enlarging right pleural effusion with pulmonary edema.  Per chart pt has had problems with right sided effusions in the past and had a pleural drain in Nov 2013 which was subsequently removed while at [facility].   Subjective: My evaluation today she is awake alert oriented. She presently denies shortness of breath or chest pain, tolerating by mouth intake..    Assessment/Plan:  Acute respiratory failure due to  Acute diastolic heart failure with reccurent right pleural effusions - Flu negative - now on nasal cannula after thoracentesis and removal of 1100 cc of fluid consistent with transudate - diastolic CHF is felt to be the primary mechanism  - Restarting Lasix at 40 mg by mouth daily - Pleurex is not currently felt to be indicated per PCCM   Paroxysmal atrial fibrillation - resumed low dose Metoprolol - currently NSR and BP now stable  - chronic coumadin (with acute dosing per pharmacy) INR therapeutic at 2.08 06/02/2013, her Lovenox has been discontinued given therapeutic INR.  COPD - stable currently - pt reports home O2 at 2lpm at at all times  - PRN nebs - wean to home O2 dose   DM - low dose sliding scale for now - CBG reasonable   CKD 3 -  Patient's creatinine this morning improving to 2.02 from 2.5 on 06/01/2013. Will restart Lasix at 40 mg by mouth daily, followup on a.m. BMP.  H/o CVA  MRSA screen + Usual precautions   Code Status: FULL Family Communication: no family present at time of exam today  Disposition Plan: stable for transfer to medical bed - f/u CXR in AM to reassess effusion size - follow renal fxn w/ diuresis and determine optimum dose for d/c home - begin to ambulate - wean O2 as able   Consultants: PCCM  Procedures: 1/04 - thoracentesis >> transudate  Antibiotics: Primaxin 1/4 Vanc 1/4  DVT prophylaxis: Lovenox until warfarin therapeutic   Objective: Blood pressure 164/81, pulse 74, temperature 97.8 F (36.6 C), temperature source Oral, resp. rate 20, height 5\' 1"  (1.549 m), weight 76.1 kg (167 lb 12.3 oz), SpO2 99.00%. No intake or output data in the 24 hours ending 06/02/13 1752 Exam: General: mild resp distress noted w/ tachypnea even at rest  Lungs:  Poor air movement B bases - no wheeze  Cardiovascular: Regular rate and rhythm without murmur gallop or rub  Abdomen: Nontender, nondistended, soft, bowel sounds positive, no rebound, no ascites, no appreciable mass Extremities: No significant cyanosis, clubbing, or edema bilateral lower extremities  Data Reviewed: Basic Metabolic Panel:  Recent Labs Lab 05/30/13 1415 05/30/13 1910 05/31/13 0500 06/01/13 0500 06/02/13 0500  NA 136*  --  139 140 138  K 4.5  --  5.3 4.8 4.3  CL 105  --  108 107 108  CO2 20  --  17*  19 20  GLUCOSE 75  --  132* 105* 113*  BUN 22  --  28* 41* 36*  CREATININE 1.66*  --  2.08* 2.51* 2.02*  CALCIUM 8.5  --  8.2* 7.7* 7.9*  MG  --  2.0  --   --   --    Liver Function Tests:  Recent Labs Lab 05/30/13 1910 05/31/13 0500  AST 32 32  ALT 17 16  ALKPHOS 73 73  BILITOT 0.3 0.2*  PROT 7.4 7.6  ALBUMIN 2.2* 2.2*   CBC:  Recent Labs Lab 05/30/13 1415 05/30/13 1910 05/31/13 0500  WBC 6.8 6.9 6.5   NEUTROABS 4.8  --   --   HGB 10.3* 10.6* 10.4*  HCT 32.9* 33.7* 33.6*  MCV 83.1 83.2 83.2  PLT 203 235 217   Cardiac Enzymes:  Recent Labs Lab 05/30/13 1415 05/30/13 1910 05/30/13 2305 05/31/13 0433  TROPONINI <0.30 <0.30 <0.30 <0.30   BNP (last 3 results)  Recent Labs  05/06/13 0636 05/08/13 1054  PROBNP 11763.0* 17420.0*   CBG:  Recent Labs Lab 06/01/13 1649 06/02/13 0435 06/02/13 0756 06/02/13 1221 06/02/13 1727  GLUCAP 117* 119* 99 129* 96    Recent Results (from the past 240 hour(s))  MRSA PCR SCREENING     Status: Abnormal   Collection Time    05/30/13  8:29 PM      Result Value Range Status   MRSA by PCR POSITIVE (*) NEGATIVE Final   Comment:            The GeneXpert MRSA Assay (FDA     approved for NASAL specimens     only), is one component of a     comprehensive MRSA colonization     surveillance program. It is not     intended to diagnose MRSA     infection nor to guide or     monitor treatment for     MRSA infections.     RESULT CALLED TO, READ BACK BY AND VERIFIED WITH:     PETTIFORD,A RN [phone] AT 2148 SKEEN,P  CULTURE, BLOOD (ROUTINE X 2)     Status: None   Collection Time    05/31/13  1:15 AM      Result Value Range Status   Specimen Description BLOOD RIGHT ARM   Final   Special Requests BOTTLES DRAWN AEROBIC ONLY 4CC   Final   Culture  Setup Time     Final   Value: 05/31/2013 13:48     Performed at [facility]   Culture     Final   Value:        BLOOD CULTURE RECEIVED NO GROWTH TO DATE CULTURE WILL BE HELD FOR 5 DAYS BEFORE ISSUING A FINAL NEGATIVE REPORT     Performed at [facility]   Report Status PENDING   Incomplete  CULTURE, BLOOD (ROUTINE X 2)     Status: None   Collection Time    05/31/13  1:24 AM      Result Value Range Status   Specimen Description BLOOD RIGHT HAND   Final   Special Requests BOTTLES DRAWN AEROBIC ONLY [facility]   Final   Culture  Setup Time     Final   Value: 05/31/2013 13:48      Performed at [facility]   Culture     Final   Value:        BLOOD CULTURE RECEIVED NO GROWTH TO DATE CULTURE WILL BE HELD  FOR 5 DAYS BEFORE ISSUING A FINAL NEGATIVE REPORT     [facility]   Report Status PENDING   Incomplete  BODY FLUID CULTURE     Status: None   Collection Time    05/31/13  3:34 AM      Result Value Range Status   Specimen Description FLUID PLEURAL   Final   Special Requests NONE   Final   Gram Stain     Final   Value: CYTOSPIN SLIDE WBC PRESENT,BOTH PMN AND MONONUCLEAR     NO ORGANISMS SEEN     [facility]formed at [facility]   Culture     Final   Value: NO GROWTH 2 DAYS     [facility]formed at [facility]   Report Status PENDING   Incomplete     Studies:  Recent x-ray studies have been reviewed in detail by the Attending Physician  Scheduled Meds:  Scheduled Meds: . aspirin  81 mg Oral Daily  . Chlorhexidine Gluconate Cloth  6 each Topical Q0600  . clonazePAM  0.5 mg Oral BID  . enoxaparin (LOVENOX) injection  30 mg Subcutaneous Q24H  . insulin aspart  0-9 Units Subcutaneous TID WC  . levalbuterol  0.63 mg Nebulization TID  . metoprolol tartrate  12.5 mg Oral BID  . mupirocin ointment  1 application Nasal BID  . warfarin  1.5 mg Oral ONCE-1800  . Warfarin - Pharmacist Dosing Inpatient   Does not apply q1800   Time spent on care of this patient: 35 mins   Jeralyn BennettZAMORA, Rachelann Enloe, MD  [facility] Office  [phone] Pager - Text Page per Amion as per below:  On-Call/Text Page:      [email or web address]      password [email or web address]  If 7PM-7AM, please contact night-coverage [email or web address] Password [email or web address] 06/02/2013, 5:52 PM   LOS: 3 days

## 2013-06-02 NOTE — Progress Notes (Signed)
Pt foley d/c per order. Pt tolerated well.

## 2013-06-02 NOTE — Progress Notes (Signed)
OT Cancellation Note  Patient Details Name: Ariana AmatoShirley [name] MRN: [id number] DOB: 02/21/1941   Cancelled Treatment:    Reason Eval/Treat Not Completed: OT screened, no needs identified, will sign off  Order received, reviewed chart. Pt from SNF and planning to return to SNF.  No acute OT needs identified. Will sign off.     Earlie RavelingStraub, Shontel Santee L OTR/L [phone] 06/02/2013, 2:28 PM

## 2013-06-03 DIAGNOSIS — F0390 Unspecified dementia without behavioral disturbance: Secondary | ICD-10-CM

## 2013-06-03 LAB — BASIC METABOLIC PANEL
BUN: 33 mg/dL — AB (ref 6–23)
CO2: 19 meq/L (ref 19–32)
CREATININE: 1.89 mg/dL — AB (ref 0.50–1.10)
Calcium: 8.7 mg/dL (ref 8.4–10.5)
Chloride: 107 mEq/L (ref 96–112)
GFR calc Af Amer: 30 mL/min — ABNORMAL LOW (ref >90)
GFR calc non Af Amer: 26 mL/min — ABNORMAL LOW (ref >90)
GLUCOSE: 121 mg/dL — AB (ref 70–99)
Potassium: 4.3 mEq/L (ref 3.7–5.3)
Sodium: 139 mEq/L (ref 137–147)

## 2013-06-03 LAB — BODY FLUID CULTURE: Culture: NO GROWTH

## 2013-06-03 LAB — CBC
HCT: 32.3 % — ABNORMAL LOW (ref 36.0–46.0)
Hemoglobin: 9.7 g/dL — ABNORMAL LOW (ref 12.0–15.0)
MCH: 25.2 pg — ABNORMAL LOW (ref 26.0–34.0)
MCHC: 30 g/dL (ref 30.0–36.0)
MCV: 83.9 fL (ref 78.0–100.0)
Platelets: 210 10*3/uL (ref 150–400)
RBC: 3.85 MIL/uL — AB (ref 3.87–5.11)
RDW: 19.5 % — ABNORMAL HIGH (ref 11.5–15.5)
WBC: 6.7 10*3/uL (ref 4.0–10.5)

## 2013-06-03 LAB — PROTIME-INR
INR: 2.17 — ABNORMAL HIGH (ref 0.00–1.49)
Prothrombin Time: 23.5 seconds — ABNORMAL HIGH (ref 11.6–15.2)

## 2013-06-03 LAB — GLUCOSE, CAPILLARY
GLUCOSE-CAPILLARY: 130 mg/dL — AB (ref 70–99)
Glucose-Capillary: 108 mg/dL — ABNORMAL HIGH (ref 70–99)

## 2013-06-03 LAB — ADENOSINE DEAMINASE, FLUID: Adenosine Deaminase, Fluid: 5.3 U/L (ref <7.6)

## 2013-06-03 MED ORDER — WARFARIN SODIUM 2 MG PO TABS
2.0000 mg | ORAL_TABLET | Freq: Every day | ORAL | Status: DC
  Filled 2013-06-03: qty 1

## 2013-06-03 MED ORDER — CLONAZEPAM 0.5 MG PO TABS: 0.5000 mg | ORAL_TABLET | Freq: Two times a day (BID) | ORAL | Status: DC

## 2013-06-03 NOTE — Progress Notes (Signed)
ANTICOAGULATION CONSULT NOTE - Follow Up Consult  Pharmacy Consult for coumadin Indication: atrial fibrillation  Allergies  Allergen Reactions  . Baclofen   . Clindamycin/Lincomycin    Patient Measurements: Height: 5\' 1"  (154.9 cm) Weight: 170 lb 6.7 oz (77.3 kg) IBW/kg (Calculated) : 47.8  Vital Signs: Temp: 97.9 F (36.6 C) (01/07 0602) Temp src: Oral (01/07 0602) BP: 148/86 mmHg (01/07 0602) Pulse Rate: 82 (01/07 0602)  Labs:  Recent Labs  06/01/13 0500 06/02/13 0500 06/03/13 0505  HGB  --   --  9.7*  HCT  --   --  32.3*  PLT  --   --  210  LABPROT 19.4* 22.7* 23.5*  INR 1.69* 2.08* 2.17*  CREATININE 2.51* 2.02* 1.89*   Estimated Creatinine Clearance: 25.7 ml/min (by C-G formula based on Cr of 1.89).  Assessment: Patient is a 73 y.o F on coumadin PTA for afib/CVA and s/p thoracentesis for pleural effusion on 1/04.   INR is therapeutic at 2.17.  She has some mild anemia but her H/H and platelets are stable this morning and there is no bleeding documented.  HOME Dose:  Warfarin 1.5mg  daily at bedtime - per [facility] MAG (she was sub-therapeutic on admit)  Education:  She has been provided education regarding Warfarin and we are available for any further questions she may have.  Med Review:  No noted major drug interacting medications with Warfarin.  Goal of Therapy:  INR 2-3   Plan: 1).  Will start Warfarin 2 mg daily to see if that maintains her within desired goal range. 2).  Monitor for s/s of bleeding complications   Nadara MustardNita Mordecai Tindol, PharmD., MS Clinical Pharmacist Pager:  [phone] Thank you for allowing pharmacy to be part of this patients care team. 06/03/2013,8:55 AM

## 2013-06-03 NOTE — Clinical Social Work Note (Signed)
Per MD patient ready to DC back to [facility]. RN, patient's son, and facility notified of DC. RN given number for report. DC packet left on patient's chart. Ambulance transport requested for patient. CSW signing off at this time.  Roddie McBryant Katina Remick, [place] City[place]CSWA, [place], [phone]

## 2013-06-03 NOTE — Discharge Summary (Signed)
Physician Discharge Summary  Ariana [name] OZH:[id number] DOB: [birth year] DOA: 05/30/2013  PCP: Garlan Fillers, MD  Admit date: 05/30/2013 Discharge date: 06/03/2013  Time spent: 40 minutes  Recommendations for Outpatient Follow-up:  1. Followup with primary care physician within one week.  Discharge Diagnoses:  Principal Problem:   Acute respiratory failure Active Problems:   Pleural effusion   Paroxysmal atrial fibrillation   Dyspnea   Acute diastolic heart failure   Discharge Condition: Stable  Diet recommendation: Heart healthy  Filed Weights   06/01/13 0437 06/01/13 2103 06/03/13 0602  Weight: 79.6 kg (175 lb 7.8 oz) 76.1 kg (167 lb 12.3 oz) 77.3 kg (170 lb 6.7 oz)    History of present illness:  73 year old female with a history of recent hospitalization at [facility] in November for pneumonia and parapneumonic effusion who presented to the ED with shortness of breath. She was transferred from [facility]. She came in on a nonrebreather, with diminished breath sounds. She was found to have an O2 sat of 82% on room air. According to the patient's daughter-in-law the patient had been afebrile, but had developed progressive shortness of breath. Chest x-ray showed increased densities in the right lower lobe and an enlarging right pleural effusion with pulmonary edema.  Per chart pt has had problems with right sided effusions in the past and had a pleural drain in Nov 2013 which was subsequently removed while at [facility].   Hospital Course:   1. Acute on chronic respiratory failure: Due to  acute diastolic CHF with recurrent pleural effusion. Patient has negative flu PCR, diastolic CHF felt to be the primary mechanism, this is treated with Lasix, and patient now back to her regular dose. Status post right-sided paracentesis with control of 1100 cc of fluids consistent with a transudate. Patient evaluated by PCCM and felt as not a candidate  for Pleurx catheter.  2. Paroxysmal atrial fibrillation: On low dose of metoprolol, that does resume, currently normal sinus rhythm and blood pressure is stable. He is on chronic Coumadin therapy and therapeutic INR.  3. COPD stable: Patient reported that she on 2-3 L of oxygen at home.  4. Diabetes mellitus: CBC was controlled during this hospital stay.  5. CK stage III: Presented with creatinine of 2.5, baseline creatinine around 1.6, after reinstitution of Lasix therapy the creatinine improved to 1.6 at the time of discharge.  Procedures:  Right-sided thoracentesis with removal of all of it 1100 mL of amber pleural fluid.  Consultations:  Patient was PCCM, [facility] overall January fifth.  Discharge Exam: Filed Vitals:   06/03/13 0602  BP: 148/86  Pulse: 82  Temp: 97.9 F (36.6 C)  Resp: 20   General: Alert and awake, oriented x3, not in any acute distress. HEENT: anicteric sclera, pupils reactive to light and accommodation, EOMI CVS: S1-S2 clear, no murmur rubs or gallops Chest: clear to auscultation bilaterally, no wheezing, rales or rhonchi Abdomen: soft nontender, nondistended, normal bowel sounds, no organomegaly Extremities: no cyanosis, clubbing or edema noted bilaterally Neuro: Cranial nerves II-XII intact, no focal neurological deficits  Discharge Instructions  Discharge Orders   Future Orders Complete By Expires   Diet - low sodium heart healthy  As directed    Increase activity slowly  As directed        Medication List    STOP taking these medications       oseltamivir 75 MG capsule  Commonly known as:  TAMIFLU  TAKE these medications       albuterol (2.5 MG/3ML) 0.083% nebulizer solution  Commonly known as:  PROVENTIL  Take 2.5 mg by nebulization every 6 (six) hours as needed for wheezing or shortness of breath.     ASPIRIN ADULT LOW STRENGTH 81 MG chewable tablet  Generic drug:  aspirin  Chew 81 mg by mouth daily.      atorvastatin 20 MG tablet  Commonly known as:  LIPITOR  Take 20 mg by mouth at bedtime.     clonazePAM 0.5 MG tablet  Commonly known as:  KLONOPIN  Take 1 tablet (0.5 mg total) by mouth 2 (two) times daily.     docusate sodium 100 MG capsule  Commonly known as:  COLACE  Take 100 mg by mouth 2 (two) times daily.     famotidine 20 MG tablet  Commonly known as:  PEPCID  Take 20 mg by mouth 2 (two) times daily.     furosemide 40 MG tablet  Commonly known as:  LASIX  Take 40 mg by mouth daily.     ipratropium 0.02 % nebulizer solution  Commonly known as:  ATROVENT  Take 0.5 mg by nebulization every 6 (six) hours as needed for wheezing or shortness of breath.     LEVEMIR FLEXTOUCH 100 UNIT/ML Pen  Generic drug:  Insulin Detemir  Inject 7 Units into the skin 2 (two) times daily.     metoprolol tartrate 25 MG tablet  Commonly known as:  LOPRESSOR  Take 25 mg by mouth 2 (two) times daily.     multivitamins ther. w/minerals Tabs tablet  Take 1 tablet by mouth daily.     potassium chloride 10 MEQ tablet  Commonly known as:  K-DUR,KLOR-CON  Take 10 mEq by mouth daily.     senna-docusate 8.6-50 MG per tablet  Commonly known as:  Senokot-S  Take 1 tablet by mouth at bedtime.     traZODone 50 MG tablet  Commonly known as:  DESYREL  Take 50 mg by mouth at bedtime.     venlafaxine XR 75 MG 24 hr capsule  Commonly known as:  EFFEXOR-XR  Take 75 mg by mouth daily.     warfarin 1 MG tablet  Commonly known as:  COUMADIN  Take 1.5 mg by mouth at bedtime.       Allergies  Allergen Reactions  . Baclofen   . Clindamycin/Lincomycin        Follow-up Information   Follow up with Garlan Fillers, MD In 1 week.   Specialty:  Internal Medicine   Contact information:   [address] [place] [address] [phone]        The results of significant diagnostics from this hospitalization (including imaging, microbiology, ancillary and laboratory) are listed below for  reference.    Significant Diagnostic Studies: Ct Head Wo Contrast  05/30/2013   CLINICAL DATA:  History of CVA.  EXAM: CT HEAD WITHOUT CONTRAST  TECHNIQUE: Contiguous axial images were obtained from the base of the skull through the vertex without intravenous contrast.  COMPARISON:  None.  FINDINGS: Examination demonstrates evidence of an old large left MCA territory infarct and left occipital infarct. There is chronic ischemic microvascular disease present. The ventricles, cisterns and remaining CSF spaces are otherwise unremarkable. Possible old sub cm focal infarct over the right cerebellum. There is no mass, mass effect, shift of midline structures or acute hemorrhage. Remaining bones and soft tissues are unremarkable.  IMPRESSION: No acute intracranial findings.  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   Electronically Signed   By: Elberta Fortis M.D.   On: 05/30/2013 19:00   Ct Chest Wo Contrast  05/30/2013   CLINICAL DATA:  Recurrent pleural effusion.  Shortness of breath.  EXAM: CT CHEST WITHOUT CONTRAST  TECHNIQUE: Multidetector CT imaging of the chest was performed following the standard protocol without IV contrast.  COMPARISON:  04/26/2013  FINDINGS: Moderate right pleural effusion has increased in size since previous study. Small left pleural effusion shows no significant change.  Moderate cardiomegaly and tiny pericardial effusion are stable. Diffuse pulmonary interstitial infiltrates show no significant change, and are suspicious for diffuse interstitial edema or atypical infection. Compressive atelectasis is seen bilaterally related to the pleural effusions, but there is no evidence of focal pulmonary consolidation.  Shotty subcentimeter mediastinal lymph nodes are stable. Calcified bilateral hilar lymph nodes are again seen, consistent with old granulomatous disease. A benign left adrenal adenoma remains stable.  IMPRESSION: Stable  cardiomegaly, tiny pericardial effusion, and diffuse interstitial infiltrates suspicious for diffuse interstitial edema or atypical infection.  Increased size of moderate right pleural effusion. Stable small left pleural effusion.  Stable benign left adrenal adenoma.   Electronically Signed   By: Myles Rosenthal M.D.   On: 05/30/2013 19:09   Dg Chest Port 1 View  06/02/2013   CLINICAL DATA:  Followup effusions  EXAM: PORTABLE CHEST - 1 VIEW  COMPARISON:  05/31/2013  FINDINGS: Cardiac shadow remains enlarged. Calcification of the thoracic aorta is again identified. Bilateral small effusions are noted right slightly greater than left. The right effusion has recurred in the interval from the prior exam. No focal confluent infiltrate is seen. Mild central vascular congestion is noted. Postoperative changes in the lumbar spine are seen. A left-sided PICC line is noted with the tip in the left innominate vein and stable.  IMPRESSION: Recurrent right-sided pleural effusion. The remainder of the exam is stable from the prior study.   Electronically Signed   By: Alcide Clever M.D.   On: 06/02/2013 08:03   Dg Chest Port 1 View  05/31/2013   ADDENDUM REPORT: 05/31/2013 09:31  ADDENDUM: This examination was reviewed with attention to the specific location of the left arm PICC. Chest CT 05/30/2013 and chest radiographs ranging from 05/08/2013 through 05/30/2013 are correlated. The PICC tip is superior to the aortic arch, within the left brachiocephalic vein on recent CT. The tip has retracted from the upper SVC compared with the radiographs obtained 3 weeks ago.   Electronically Signed   By: Roxy Horseman M.D.   On: 05/31/2013 09:31   05/31/2013   CLINICAL DATA:  Status post thoracentesis  EXAM: PORTABLE CHEST - 1 VIEW  COMPARISON:  Prior chest CT from 05/30/2013  FINDINGS: Cardiomegaly is stable as compared to prior exam. Mediastinal silhouette within normal limits. Left PICC catheter is unchanged.  Lungs are normally inflated.  There has been interval improvement in previously seen right pleural effusion, consistent with history of thoracentesis. No definite pneumothorax identified. A small residual right pleural effusion persists. Left-sided pleural effusion is similar.  There is diffuse pulmonary vascular congestion without frank pulmonary edema. No definite focal infiltrate. Linear left basilar atelectasis is noted.  IMPRESSION: 1. No pneumothorax identified status post thoracentesis. A small residual right pleural effusion persist. 2. Grossly stable left-sided pleural effusion. 3. Cardiomegaly with diffuse pulmonary vascular congestion.  Electronically Signed: By: Rise Mu M.D. On: 05/31/2013 04:55  Dg Chest Portable 1 View  05/30/2013   CLINICAL DATA:  Shortness of breath.  CHF.  EXAM: PORTABLE CHEST - 1 VIEW  COMPARISON:  05/20/2013  FINDINGS: PICC line tip is in the left innominate vein region. Diffuse interstitial densities suggest pulmonary edema. Increased densities in the right lower chest could represent layering pleural fluid. Heart size appears to be upper limits of normal. No evidence for a pneumothorax.  IMPRESSION: Increased densities in the right lower chest may represent enlarging pleural effusion.  Interstitial pulmonary edema.  PICC line tip in the left innominate vein region.   Electronically Signed   By: Richarda Overlie M.D.   On: 05/30/2013 13:30   Dg Chest Port 1 View  05/20/2013   CLINICAL DATA:  Respiratory failure.  EXAM: PORTABLE CHEST - 1 VIEW  COMPARISON:  05/18/2013.  FINDINGS: PICC line in stable position with its tip in the brachiocephalic vein on the left. Persistent severe cardiomegaly. Mild pulmonary vascular prominence cannot be excluded. Persistent bilateral pulmonary interstitial prominence with bilateral pleural effusions. These findings have improved slightly from prior exam. No pneumothorax. Bilateral subclavian artery atherosclerotic vascular disease. Carotid atherosclerotic  vascular disease.  IMPRESSION: 1. Persistent congestive heart failure with pulmonary interstitial edema and bilateral pleural effusions. These findings have improved from prior exam.  2. Bilateral dense carotid and subclavian artery calcification indicating atherosclerotic vascular disease .   Electronically Signed   By: Maisie Fus  Register   On: 05/20/2013 08:01   Dg Chest Port 1 View  05/18/2013   CLINICAL DATA:  Reassess pleural effusion.  EXAM: PORTABLE CHEST - 1 VIEW  COMPARISON:  May 15, 2013.  FINDINGS: The lungs are reasonably well inflated. The hemidiaphragms remain partially obscured. There is a moderate-sized right pleural effusion and smaller left pleural effusion. The pulmonary interstitial markings remain increased but have slightly improved on the right since the earlier study. The cardiopericardial silhouette remains enlarged. The pulmonary vascularity remains indistinct. The PICC line on the left is unchanged in appearance with the tip in the region of the junction of right and left brachiocephalic veins. There is calcification within the visualized portions of the neck which may overlie within the carotid arterial systems bilaterally.  IMPRESSION: There are persistent bilateral pleural effusions greater on the right than on the left. There has been slight interval improvement in the appearance of the pulmonary interstitium on the right. Pulmonary interstitial edema remains bilaterally.   Electronically Signed   By: David  [place]   On: 05/18/2013 07:52   Dg Chest Port 1 View  05/15/2013   CLINICAL DATA:  Respiratory failure  EXAM: PORTABLE CHEST - 1 VIEW  COMPARISON:  05/11/2013  FINDINGS: Cardiac shadow is stable. A left-sided PICC line is noted in the innominate vein. Bilateral pleural effusions are noted. Bibasilar atelectasis is likely present as well.  IMPRESSION: No change from the prior exam.   Electronically Signed   By: Alcide Clever M.D.   On: 05/15/2013 08:10   Dg Chest Port 1  View  05/11/2013   CLINICAL DATA:  Respiratory failure.  EXAM: PORTABLE CHEST - 1 VIEW  COMPARISON:  05/08/2013  FINDINGS: Left central line remains in place, unchanged. Right effusion and right airspace disease again noted, unchanged. Increasing left basilar atelectasis or infiltrate. Mild cardiomegaly and vascular congestion.  IMPRESSION: Stable right pleural effusion and right lung airspace disease. Increasing left base atelectasis or infiltrate.   Electronically Signed   By: Charlett Nose M.D.   On: 05/11/2013 08:10  Dg Chest Port 1 View  05/08/2013   CLINICAL DATA:  Followup respiratory failure.  EXAM: PORTABLE CHEST - 1 VIEW  COMPARISON:  05/05/2013 and multiple previous  FINDINGS: Artifact overlies the chest. Left arm PICC has its tip at the innominate SVC junction. There is persistent mild atelectasis at the left base. There is persistent moderate atelectasis at the right base probably with some pleural fluid. The pattern shows improved aeration at the left base. No worsening or new findings.  IMPRESSION: Some improved aeration at the left base. No change on the right with more extensive volume loss and possibly some pleural fluid.   Electronically Signed   By: Paulina FusiMark  Shogry M.D.   On: 05/08/2013 08:00   Dg Chest Port 1 View  05/05/2013   CLINICAL DATA:  Decreased oxygen levels, shortness of Breath  EXAM: PORTABLE CHEST - 1 VIEW  COMPARISON:  Portable exam 1608 hr compared to 05/03/2013  FINDINGS: Tip of left arm PICC line projects over the junction of the SVC and left brachiocephalic vein.  Enlargement of cardiac silhouette with pulmonary vascular congestion.  Diffuse infiltrates likely pulmonary edema and CHF.  Bibasilar effusions again seen.  No gross pneumothorax or acute osseous abnormality.  Bones appear demineralized.  IMPRESSION: CHF, little changed.   Electronically Signed   By: Ulyses SouthwardMark  Boles M.D.   On: 05/05/2013 16:38   Dg Abd Portable 1v  05/08/2013   CLINICAL DATA:  Ileus  EXAM:  PORTABLE ABDOMEN - 1 VIEW  COMPARISON:  None.  FINDINGS: Gas pattern is normal without evidence of ileus, obstruction or detectable free air. Previous spinal fusion again noted. No acute bony finding.  IMPRESSION: Gas pattern unremarkable.  No suggestion of ileus by radiography.   Electronically Signed   By: Paulina FusiMark  Shogry M.D.   On: 05/08/2013 08:03    Microbiology: Recent Results (from the past 240 hour(s))  MRSA PCR SCREENING     Status: Abnormal   Collection Time    05/30/13  8:29 PM      Result Value Range Status   MRSA by PCR POSITIVE (*) NEGATIVE Final   Comment:            The GeneXpert MRSA Assay (FDA     approved for NASAL specimens     only), is one component of a     comprehensive MRSA colonization     surveillance program. It is not     intended to diagnose MRSA     infection nor to guide or     monitor treatment for     MRSA infections.     RESULT CALLED TO, READ BACK BY AND VERIFIED WITH:     PETTIFORD,A RN [phone] AT 2148 SKEEN,P  CULTURE, BLOOD (ROUTINE X 2)     Status: None   Collection Time    05/31/13  1:15 AM      Result Value Range Status   Specimen Description BLOOD RIGHT ARM   Final   Special Requests BOTTLES DRAWN AEROBIC ONLY 4CC   Final   Culture  Setup Time     Final   Value: 05/31/2013 13:48     Performed at [facility]   Culture     Final   Value:        BLOOD CULTURE RECEIVED NO GROWTH TO DATE CULTURE WILL BE HELD FOR 5 DAYS BEFORE ISSUING A FINAL NEGATIVE REPORT     Performed at [facility]   Report Status PENDING  Incomplete  CULTURE, BLOOD (ROUTINE X 2)     Status: None   Collection Time    05/31/13  1:24 AM      Result Value Range Status   Specimen Description BLOOD RIGHT HAND   Final   Special Requests BOTTLES DRAWN AEROBIC ONLY 6CC   Final   Culture  Setup Time     Final   Value: 05/31/2013 13:48     Performed at [facility]   Culture     Final   Value:        BLOOD CULTURE RECEIVED NO GROWTH TO DATE CULTURE  WILL BE HELD FOR 5 DAYS BEFORE ISSUING A FINAL NEGATIVE REPORT     Performed at [facility]   Report Status PENDING   Incomplete  BODY FLUID CULTURE     Status: None   Collection Time    05/31/13  3:34 AM      Result Value Range Status   Specimen Description FLUID PLEURAL   Final   Special Requests NONE   Final   Gram Stain     Final   Value: CYTOSPIN SLIDE WBC PRESENT,BOTH PMN AND MONONUCLEAR     NO ORGANISMS SEEN     Performed at [facility]   Culture     Final   Value: NO GROWTH 2 DAYS     Performed at [facility]   Report Status PENDING   Incomplete     Labs: Basic Metabolic Panel:  Recent Labs Lab 05/30/13 1415 05/30/13 1910 05/31/13 0500 06/01/13 0500 06/02/13 0500 06/03/13 0505  NA 136*  --  139 140 138 139  K 4.5  --  5.3 4.8 4.3 4.3  CL 105  --  108 107 108 107  CO2 20  --  17* 19 20 19   GLUCOSE 75  --  132* 105* 113* 121*  BUN 22  --  28* 41* 36* 33*  CREATININE 1.66*  --  2.08* 2.51* 2.02* 1.89*  CALCIUM 8.5  --  8.2* 7.7* 7.9* 8.7  MG  --  2.0  --   --   --   --    Liver Function Tests:  Recent Labs Lab 05/30/13 1910 05/31/13 0500  AST 32 32  ALT 17 16  ALKPHOS 73 73  BILITOT 0.3 0.2*  PROT 7.4 7.6  ALBUMIN 2.2* 2.2*   No results found for this basename: LIPASE, AMYLASE,  in the last 168 hours No results found for this basename: AMMONIA,  in the last 168 hours CBC:  Recent Labs Lab 05/30/13 1415 05/30/13 1910 05/31/13 0500 06/03/13 0505  WBC 6.8 6.9 6.5 6.7  NEUTROABS 4.8  --   --   --   HGB 10.3* 10.6* 10.4* 9.7*  HCT 32.9* 33.7* 33.6* 32.3*  MCV 83.1 83.2 83.2 83.9  PLT 203 235 217 210   Cardiac Enzymes:  Recent Labs Lab 05/30/13 1415 05/30/13 1910 05/30/13 2305 05/31/13 0433  TROPONINI <0.30 <0.30 <0.30 <0.30   BNP: BNP (last 3 results)  Recent Labs  05/06/13 0636 05/08/13 1054  PROBNP 11763.0* 17420.0*   CBG:  Recent Labs Lab 06/02/13 0756 06/02/13 1221 06/02/13 1727  06/02/13 2238 06/03/13 0815  GLUCAP 99 129* 96 133* 108*       Signed:  Eddis Pingleton A  [facility] 06/03/2013, 1:11 PM

## 2013-06-03 NOTE — Progress Notes (Signed)
Report called to [facility]. Pt son aware that pt will be d/c.

## 2013-06-06 LAB — CULTURE, BLOOD (ROUTINE X 2)
CULTURE: NO GROWTH
Culture: NO GROWTH

## 2013-06-08 ENCOUNTER — Inpatient Hospital Stay (HOSPITAL_COMMUNITY)
Admission: EM | Admit: 2013-06-08 | Discharge: 2013-06-17 | DRG: 291 | Disposition: A | Discharge status: Skilled Nursing Facility | Payer: Medicare Other | Attending: Internal Medicine | Admitting: Internal Medicine

## 2013-06-08 ENCOUNTER — Inpatient Hospital Stay (HOSPITAL_COMMUNITY): Payer: Medicare Other

## 2013-06-08 ENCOUNTER — Emergency Department (HOSPITAL_COMMUNITY): Payer: Medicare Other

## 2013-06-08 ENCOUNTER — Encounter (HOSPITAL_COMMUNITY): Payer: Self-pay | Admitting: Emergency Medicine

## 2013-06-08 DIAGNOSIS — Z7982 Long term (current) use of aspirin: Secondary | ICD-10-CM

## 2013-06-08 DIAGNOSIS — J9611 Chronic respiratory failure with hypoxia: Secondary | ICD-10-CM | POA: Diagnosis present

## 2013-06-08 DIAGNOSIS — N179 Acute kidney failure, unspecified: Secondary | ICD-10-CM | POA: Diagnosis not present

## 2013-06-08 DIAGNOSIS — Z79899 Other long term (current) drug therapy: Secondary | ICD-10-CM

## 2013-06-08 DIAGNOSIS — I480 Paroxysmal atrial fibrillation: Secondary | ICD-10-CM | POA: Diagnosis present

## 2013-06-08 DIAGNOSIS — T502X5A Adverse effect of carbonic-anhydrase inhibitors, benzothiadiazides and other diuretics, initial encounter: Secondary | ICD-10-CM | POA: Diagnosis not present

## 2013-06-08 DIAGNOSIS — I509 Heart failure, unspecified: Secondary | ICD-10-CM | POA: Diagnosis present

## 2013-06-08 DIAGNOSIS — Z86718 Personal history of other venous thrombosis and embolism: Secondary | ICD-10-CM

## 2013-06-08 DIAGNOSIS — E119 Type 2 diabetes mellitus without complications: Secondary | ICD-10-CM | POA: Diagnosis present

## 2013-06-08 DIAGNOSIS — I4891 Unspecified atrial fibrillation: Secondary | ICD-10-CM | POA: Diagnosis present

## 2013-06-08 DIAGNOSIS — D649 Anemia, unspecified: Secondary | ICD-10-CM | POA: Diagnosis present

## 2013-06-08 DIAGNOSIS — Z66 Do not resuscitate: Secondary | ICD-10-CM | POA: Diagnosis present

## 2013-06-08 DIAGNOSIS — J449 Chronic obstructive pulmonary disease, unspecified: Secondary | ICD-10-CM | POA: Diagnosis present

## 2013-06-08 DIAGNOSIS — Z87891 Personal history of nicotine dependence: Secondary | ICD-10-CM | POA: Diagnosis not present

## 2013-06-08 DIAGNOSIS — I5033 Acute on chronic diastolic (congestive) heart failure: Secondary | ICD-10-CM | POA: Diagnosis present

## 2013-06-08 DIAGNOSIS — Z7901 Long term (current) use of anticoagulants: Secondary | ICD-10-CM

## 2013-06-08 DIAGNOSIS — I959 Hypotension, unspecified: Secondary | ICD-10-CM | POA: Diagnosis present

## 2013-06-08 DIAGNOSIS — I69920 Aphasia following unspecified cerebrovascular disease: Secondary | ICD-10-CM | POA: Diagnosis not present

## 2013-06-08 DIAGNOSIS — I5031 Acute diastolic (congestive) heart failure: Secondary | ICD-10-CM | POA: Diagnosis present

## 2013-06-08 DIAGNOSIS — I69959 Hemiplegia and hemiparesis following unspecified cerebrovascular disease affecting unspecified side: Secondary | ICD-10-CM

## 2013-06-08 DIAGNOSIS — Z794 Long term (current) use of insulin: Secondary | ICD-10-CM

## 2013-06-08 DIAGNOSIS — N183 Chronic kidney disease, stage 3 unspecified: Secondary | ICD-10-CM | POA: Diagnosis present

## 2013-06-08 DIAGNOSIS — J9601 Acute respiratory failure with hypoxia: Secondary | ICD-10-CM | POA: Diagnosis present

## 2013-06-08 DIAGNOSIS — J9620 Acute and chronic respiratory failure, unspecified whether with hypoxia or hypercapnia: Secondary | ICD-10-CM | POA: Diagnosis present

## 2013-06-08 DIAGNOSIS — Z889 Allergy status to unspecified drugs, medicaments and biological substances status: Secondary | ICD-10-CM | POA: Diagnosis not present

## 2013-06-08 DIAGNOSIS — K219 Gastro-esophageal reflux disease without esophagitis: Secondary | ICD-10-CM | POA: Diagnosis present

## 2013-06-08 DIAGNOSIS — J90 Pleural effusion, not elsewhere classified: Secondary | ICD-10-CM | POA: Diagnosis present

## 2013-06-08 DIAGNOSIS — IMO0002 Reserved for concepts with insufficient information to code with codable children: Secondary | ICD-10-CM

## 2013-06-08 DIAGNOSIS — I2510 Atherosclerotic heart disease of native coronary artery without angina pectoris: Secondary | ICD-10-CM | POA: Diagnosis present

## 2013-06-08 DIAGNOSIS — R471 Dysarthria and anarthria: Secondary | ICD-10-CM | POA: Diagnosis present

## 2013-06-08 DIAGNOSIS — J9600 Acute respiratory failure, unspecified whether with hypoxia or hypercapnia: Secondary | ICD-10-CM

## 2013-06-08 DIAGNOSIS — R0602 Shortness of breath: Secondary | ICD-10-CM

## 2013-06-08 DIAGNOSIS — Z9981 Dependence on supplemental oxygen: Secondary | ICD-10-CM | POA: Diagnosis not present

## 2013-06-08 DIAGNOSIS — R0600 Dyspnea, unspecified: Secondary | ICD-10-CM

## 2013-06-08 DIAGNOSIS — F0390 Unspecified dementia without behavioral disturbance: Secondary | ICD-10-CM | POA: Diagnosis present

## 2013-06-08 DIAGNOSIS — J189 Pneumonia, unspecified organism: Secondary | ICD-10-CM

## 2013-06-08 DIAGNOSIS — J4489 Other specified chronic obstructive pulmonary disease: Secondary | ICD-10-CM | POA: Diagnosis present

## 2013-06-08 DIAGNOSIS — I69351 Hemiplegia and hemiparesis following cerebral infarction affecting right dominant side: Secondary | ICD-10-CM

## 2013-06-08 LAB — CBC WITH DIFFERENTIAL/PLATELET
Basophils Absolute: 0 10*3/uL (ref 0.0–0.1)
Basophils Relative: 0 % (ref 0–1)
EOS ABS: 0.1 10*3/uL (ref 0.0–0.7)
EOS PCT: 1 % (ref 0–5)
HCT: 32.4 % — ABNORMAL LOW (ref 36.0–46.0)
Hemoglobin: 10.1 g/dL — ABNORMAL LOW (ref 12.0–15.0)
LYMPHS ABS: 1.5 10*3/uL (ref 0.7–4.0)
Lymphocytes Relative: 19 % (ref 12–46)
MCH: 25.5 pg — AB (ref 26.0–34.0)
MCHC: 31.2 g/dL (ref 30.0–36.0)
MCV: 81.8 fL (ref 78.0–100.0)
Monocytes Absolute: 0.5 10*3/uL (ref 0.1–1.0)
Monocytes Relative: 6 % (ref 3–12)
Neutro Abs: 5.8 10*3/uL (ref 1.7–7.7)
Neutrophils Relative %: 74 % (ref 43–77)
Platelets: 216 10*3/uL (ref 150–400)
RBC: 3.96 MIL/uL (ref 3.87–5.11)
RDW: 18.4 % — AB (ref 11.5–15.5)
WBC: 7.8 10*3/uL (ref 4.0–10.5)

## 2013-06-08 LAB — URINALYSIS, ROUTINE W REFLEX MICROSCOPIC
Bilirubin Urine: NEGATIVE
Glucose, UA: 100 mg/dL — AB
KETONES UR: NEGATIVE mg/dL
LEUKOCYTES UA: NEGATIVE
Nitrite: NEGATIVE
PH: 5.5 (ref 5.0–8.0)
Protein, ur: >300 mg/dL — AB
Specific Gravity, Urine: 1.01 (ref 1.005–1.030)
Urobilinogen, UA: 0.2 mg/dL (ref 0.0–1.0)

## 2013-06-08 LAB — CBC
HEMATOCRIT: 31.5 % — AB (ref 36.0–46.0)
Hemoglobin: 9.7 g/dL — ABNORMAL LOW (ref 12.0–15.0)
MCH: 25.6 pg — ABNORMAL LOW (ref 26.0–34.0)
MCHC: 30.8 g/dL (ref 30.0–36.0)
MCV: 83.1 fL (ref 78.0–100.0)
Platelets: 183 10*3/uL (ref 150–400)
RBC: 3.79 MIL/uL — ABNORMAL LOW (ref 3.87–5.11)
RDW: 18.4 % — AB (ref 11.5–15.5)
WBC: 4.9 10*3/uL (ref 4.0–10.5)

## 2013-06-08 LAB — TROPONIN I
Troponin I: <0.3 ng/mL (ref <0.30)
Troponin I: <0.3 ng/mL (ref <0.30)

## 2013-06-08 LAB — POCT I-STAT, CHEM 8
BUN: 30 mg/dL — AB (ref 6–23)
CREATININE: 1.9 mg/dL — AB (ref 0.50–1.10)
Calcium, Ion: 1.22 mmol/L (ref 1.13–1.30)
Chloride: 107 mEq/L (ref 96–112)
GLUCOSE: 132 mg/dL — AB (ref 70–99)
HCT: 37 % (ref 36.0–46.0)
HEMOGLOBIN: 12.6 g/dL (ref 12.0–15.0)
POTASSIUM: 4.7 meq/L (ref 3.7–5.3)
Sodium: 140 mEq/L (ref 137–147)
TCO2: 22 mmol/L (ref 0–100)

## 2013-06-08 LAB — POCT I-STAT 3, VENOUS BLOOD GAS (G3P V)
ACID-BASE DEFICIT: 6 mmol/L — AB (ref 0.0–2.0)
Bicarbonate: 21.5 mEq/L (ref 20.0–24.0)
O2 SAT: 38 %
TCO2: 23 mmol/L (ref 0–100)
pCO2, Ven: 48.8 mmHg (ref 45.0–50.0)
pH, Ven: 7.253 (ref 7.250–7.300)
pO2, Ven: 26 mmHg — CL (ref 30.0–45.0)

## 2013-06-08 LAB — MRSA PCR SCREENING: MRSA by PCR: POSITIVE — AB

## 2013-06-08 LAB — CREATININE, SERUM
Creatinine, Ser: 1.93 mg/dL — ABNORMAL HIGH (ref 0.50–1.10)
GFR calc Af Amer: 29 mL/min — ABNORMAL LOW (ref >90)
GFR calc non Af Amer: 25 mL/min — ABNORMAL LOW (ref >90)

## 2013-06-08 LAB — CG4 I-STAT (LACTIC ACID): Lactic Acid, Venous: 2.13 mmol/L (ref 0.5–2.2)

## 2013-06-08 LAB — URINE MICROSCOPIC-ADD ON

## 2013-06-08 LAB — PROTIME-INR
INR: 1.78 — ABNORMAL HIGH (ref 0.00–1.49)
Prothrombin Time: 20.2 seconds — ABNORMAL HIGH (ref 11.6–15.2)

## 2013-06-08 LAB — POCT I-STAT TROPONIN I: Troponin i, poc: 0.09 ng/mL (ref 0.00–0.08)

## 2013-06-08 LAB — PRO B NATRIURETIC PEPTIDE: Pro B Natriuretic peptide (BNP): 21847 pg/mL — ABNORMAL HIGH (ref 0–125)

## 2013-06-08 MED ORDER — ALUM & MAG HYDROXIDE-SIMETH 200-200-20 MG/5ML PO SUSP: 30.0000 mL | Freq: Four times a day (QID) | ORAL | Status: DC | PRN

## 2013-06-08 MED ORDER — SODIUM CHLORIDE 0.9 % IJ SOLN
10.0000 mL | Freq: Two times a day (BID) | INTRAMUSCULAR | Status: DC
  Administered 2013-06-08 – 2013-06-10 (×4): 10 mL

## 2013-06-08 MED ORDER — ASPIRIN 325 MG PO TABS: 325.0000 mg | ORAL_TABLET | Freq: Once | ORAL | Status: DC

## 2013-06-08 MED ORDER — SODIUM CHLORIDE 0.9 % IJ SOLN: 10.0000 mL | INTRAMUSCULAR | Status: DC | PRN

## 2013-06-08 MED ORDER — ACETAMINOPHEN 325 MG PO TABS
650.0000 mg | ORAL_TABLET | Freq: Four times a day (QID) | ORAL | Status: DC | PRN
  Administered 2013-06-08 – 2013-06-14 (×7): 650 mg via ORAL
  Filled 2013-06-08 (×7): qty 2

## 2013-06-08 MED ORDER — SODIUM CHLORIDE 0.9 % IV SOLN: 250.0000 mL | INTRAVENOUS | Status: DC | PRN

## 2013-06-08 MED ORDER — HEPARIN SODIUM (PORCINE) 5000 UNIT/ML IJ SOLN
5000.0000 [IU] | Freq: Three times a day (TID) | INTRAMUSCULAR | Status: DC
  Administered 2013-06-08 – 2013-06-10 (×5): 5000 [IU] via SUBCUTANEOUS
  Filled 2013-06-08 (×10): qty 1

## 2013-06-08 MED ORDER — FUROSEMIDE 10 MG/ML IJ SOLN
40.0000 mg | Freq: Two times a day (BID) | INTRAMUSCULAR | Status: DC
  Administered 2013-06-09 – 2013-06-10 (×4): 40 mg via INTRAVENOUS
  Filled 2013-06-08 (×6): qty 4

## 2013-06-08 MED ORDER — MUPIROCIN 2 % EX OINT
1.0000 "application " | TOPICAL_OINTMENT | Freq: Two times a day (BID) | CUTANEOUS | Status: AC
  Administered 2013-06-09 – 2013-06-13 (×10): 1 via NASAL
  Filled 2013-06-08 (×2): qty 22

## 2013-06-08 MED ORDER — PREDNISONE 20 MG PO TABS
60.0000 mg | ORAL_TABLET | Freq: Once | ORAL | Status: DC
  Filled 2013-06-08: qty 3

## 2013-06-08 MED ORDER — SODIUM CHLORIDE 0.9 % IJ SOLN: 3.0000 mL | INTRAMUSCULAR | Status: DC | PRN

## 2013-06-08 MED ORDER — FUROSEMIDE 10 MG/ML IJ SOLN: 80.0000 mg | Freq: Two times a day (BID) | INTRAMUSCULAR | Status: DC

## 2013-06-08 MED ORDER — CHLORHEXIDINE GLUCONATE CLOTH 2 % EX PADS
6.0000 | MEDICATED_PAD | Freq: Every day | CUTANEOUS | Status: AC
  Administered 2013-06-09 – 2013-06-13 (×5): 6 via TOPICAL

## 2013-06-08 MED ORDER — ASPIRIN 81 MG PO CHEW
81.0000 mg | CHEWABLE_TABLET | Freq: Every day | ORAL | Status: DC
  Administered 2013-06-08 – 2013-06-17 (×10): 81 mg via ORAL
  Filled 2013-06-08 (×10): qty 1

## 2013-06-08 MED ORDER — ALBUTEROL SULFATE (2.5 MG/3ML) 0.083% IN NEBU
5.0000 mg | INHALATION_SOLUTION | Freq: Once | RESPIRATORY_TRACT | Status: AC
  Administered 2013-06-08: 5 mg via RESPIRATORY_TRACT
  Filled 2013-06-08: qty 6

## 2013-06-08 MED ORDER — IPRATROPIUM BROMIDE 0.02 % IN SOLN
0.5000 mg | Freq: Three times a day (TID) | RESPIRATORY_TRACT | Status: DC
  Administered 2013-06-09 (×3): 0.5 mg via RESPIRATORY_TRACT
  Filled 2013-06-08 (×4): qty 2.5

## 2013-06-08 MED ORDER — LEVOFLOXACIN IN D5W 750 MG/150ML IV SOLN
750.0000 mg | Freq: Once | INTRAVENOUS | Status: AC
  Administered 2013-06-08: 750 mg via INTRAVENOUS
  Filled 2013-06-08: qty 150

## 2013-06-08 MED ORDER — METOPROLOL TARTRATE 12.5 MG HALF TABLET
12.5000 mg | ORAL_TABLET | Freq: Two times a day (BID) | ORAL | Status: DC
  Filled 2013-06-08: qty 1

## 2013-06-08 MED ORDER — IPRATROPIUM BROMIDE 0.02 % IN SOLN
0.5000 mg | Freq: Once | RESPIRATORY_TRACT | Status: AC
  Administered 2013-06-08: 0.5 mg via RESPIRATORY_TRACT
  Filled 2013-06-08: qty 2.5

## 2013-06-08 MED ORDER — TRAZODONE HCL 50 MG PO TABS
50.0000 mg | ORAL_TABLET | Freq: Every day | ORAL | Status: DC
Start: 2013-06-08 — End: 2013-06-08
  Filled 2013-06-08: qty 1

## 2013-06-08 MED ORDER — ATORVASTATIN CALCIUM 20 MG PO TABS
20.0000 mg | ORAL_TABLET | Freq: Every day | ORAL | Status: DC
  Administered 2013-06-08 – 2013-06-16 (×9): 20 mg via ORAL
  Filled 2013-06-08 (×10): qty 1

## 2013-06-08 MED ORDER — FUROSEMIDE 10 MG/ML IJ SOLN
40.0000 mg | Freq: Once | INTRAMUSCULAR | Status: AC
  Administered 2013-06-08: 40 mg via INTRAVENOUS
  Filled 2013-06-08: qty 4

## 2013-06-08 MED ORDER — ACETAMINOPHEN 325 MG PO TABS: 650.0000 mg | ORAL_TABLET | ORAL | Status: DC | PRN

## 2013-06-08 MED ORDER — ALBUTEROL SULFATE (2.5 MG/3ML) 0.083% IN NEBU
2.5000 mg | INHALATION_SOLUTION | Freq: Three times a day (TID) | RESPIRATORY_TRACT | Status: DC
  Administered 2013-06-09 (×3): 2.5 mg via RESPIRATORY_TRACT
  Filled 2013-06-08 (×4): qty 3

## 2013-06-08 MED ORDER — DOCUSATE SODIUM 100 MG PO CAPS
100.0000 mg | ORAL_CAPSULE | Freq: Two times a day (BID) | ORAL | Status: DC
  Administered 2013-06-08 – 2013-06-17 (×18): 100 mg via ORAL
  Filled 2013-06-08 (×21): qty 1

## 2013-06-08 MED ORDER — METHYLPREDNISOLONE SODIUM SUCC 125 MG IJ SOLR
60.0000 mg | Freq: Once | INTRAMUSCULAR | Status: AC
  Administered 2013-06-08: 60 mg via INTRAVENOUS
  Filled 2013-06-08: qty 2

## 2013-06-08 MED ORDER — METOPROLOL TARTRATE 12.5 MG HALF TABLET
12.5000 mg | ORAL_TABLET | Freq: Two times a day (BID) | ORAL | Status: DC
  Administered 2013-06-09 – 2013-06-17 (×13): 12.5 mg via ORAL
  Filled 2013-06-08 (×19): qty 1

## 2013-06-08 MED ORDER — WARFARIN SODIUM 2 MG PO TABS
2.0000 mg | ORAL_TABLET | Freq: Once | ORAL | Status: AC
  Administered 2013-06-08: 2 mg via ORAL
  Filled 2013-06-08 (×2): qty 1

## 2013-06-08 MED ORDER — SENNOSIDES-DOCUSATE SODIUM 8.6-50 MG PO TABS
1.0000 | ORAL_TABLET | Freq: Every day | ORAL | Status: DC
  Administered 2013-06-08 – 2013-06-16 (×8): 1 via ORAL
  Filled 2013-06-08 (×11): qty 1

## 2013-06-08 MED ORDER — ACETAMINOPHEN 650 MG RE SUPP: 650.0000 mg | Freq: Four times a day (QID) | RECTAL | Status: DC | PRN

## 2013-06-08 MED ORDER — ALBUTEROL SULFATE (2.5 MG/3ML) 0.083% IN NEBU
2.5000 mg | INHALATION_SOLUTION | Freq: Four times a day (QID) | RESPIRATORY_TRACT | Status: DC
  Administered 2013-06-08: 2.5 mg via RESPIRATORY_TRACT
  Filled 2013-06-08: qty 3

## 2013-06-08 MED ORDER — VENLAFAXINE HCL ER 75 MG PO CP24
75.0000 mg | ORAL_CAPSULE | Freq: Every day | ORAL | Status: DC
  Administered 2013-06-08 – 2013-06-17 (×10): 75 mg via ORAL
  Filled 2013-06-08 (×10): qty 1

## 2013-06-08 MED ORDER — ASPIRIN 81 MG PO CHEW
324.0000 mg | CHEWABLE_TABLET | Freq: Once | ORAL | Status: AC
  Administered 2013-06-08: 324 mg via ORAL
  Filled 2013-06-08: qty 4

## 2013-06-08 MED ORDER — SODIUM CHLORIDE 0.9 % IJ SOLN
3.0000 mL | Freq: Two times a day (BID) | INTRAMUSCULAR | Status: DC
  Administered 2013-06-09: 3 mL via INTRAVENOUS

## 2013-06-08 MED ORDER — ONDANSETRON HCL 4 MG/2ML IJ SOLN: 4.0000 mg | Freq: Four times a day (QID) | INTRAMUSCULAR | Status: DC | PRN

## 2013-06-08 MED ORDER — FUROSEMIDE 10 MG/ML IJ SOLN
40.0000 mg | Freq: Two times a day (BID) | INTRAMUSCULAR | Status: DC
  Administered 2013-06-08: 40 mg via INTRAVENOUS
  Filled 2013-06-08: qty 4

## 2013-06-08 MED ORDER — ONDANSETRON HCL 4 MG PO TABS: 4.0000 mg | ORAL_TABLET | Freq: Four times a day (QID) | ORAL | Status: DC | PRN

## 2013-06-08 MED ORDER — METOPROLOL TARTRATE 25 MG PO TABS: 25.0000 mg | ORAL_TABLET | Freq: Two times a day (BID) | ORAL | Status: DC

## 2013-06-08 MED ORDER — INSULIN ASPART 100 UNIT/ML ~~LOC~~ SOLN: 0.0000 [IU] | Freq: Three times a day (TID) | SUBCUTANEOUS | Status: DC

## 2013-06-08 MED ORDER — IPRATROPIUM BROMIDE 0.02 % IN SOLN
0.5000 mg | Freq: Four times a day (QID) | RESPIRATORY_TRACT | Status: DC
  Administered 2013-06-08: 0.5 mg via RESPIRATORY_TRACT
  Filled 2013-06-08: qty 2.5

## 2013-06-08 MED ORDER — WARFARIN - PHARMACIST DOSING INPATIENT
Freq: Every day | Status: DC
  Administered 2013-06-12 – 2013-06-15 (×2)

## 2013-06-08 NOTE — ED Notes (Signed)
Per Dr. Arthor CaptainElmahi, continue 500cc NS bolus; reassess BP after initial bolus.

## 2013-06-08 NOTE — Progress Notes (Signed)
ANTICOAGULATION CONSULT NOTE - Initial Consult  Pharmacy Consult for coumadin Indication: atrial fibrillation  Allergies  Allergen Reactions  . Baclofen   . Clindamycin/Lincomycin     Patient Measurements:    Dosing Weight: 77.3 kg  Vital Signs: Temp: 98.6 F (37 C) (01/12 0823) BP: 121/102 mmHg (01/12 1209) Pulse Rate: 83 (01/12 0823)  Labs:  Recent Labs  06/08/13 0842 06/08/13 0857 06/08/13 1035  HGB 10.1* 12.6  --   HCT 32.4* 37.0  --   PLT 216  --   --   LABPROT 20.2*  --   --   INR 1.78*  --   --   CREATININE  --  1.90*  --   TROPONINI  --   --  <0.30    The CrCl is unknown because both a height and weight (above a minimum accepted value) are required for this calculation.   Medical History: Past Medical History  Diagnosis Date  . Pneumonia   . Pleural effusion   . CHF (congestive heart failure)   . Paroxysmal atrial fibrillation   . COPD (chronic obstructive pulmonary disease)   . Stroke   . Dysarthria due to cerebrovascular accident   . 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 Coronary artery disease   . Diabetes mellitus without complication   . GERD (gastroesophageal reflux disease)   . Dementia     Medications:  See med rec  Assessment: 73 yo lady to continue coumadin for afib.  She was on coumadin 1.5 mg daily at home.  INR on admit 1.78 Goal of Therapy:  INR 2-3 Monitor platelets by anticoagulation protocol: Yes   Plan:  Coumadin 2 mg po today.  Check daily protimes  Talbert CageSeay, Anjel Perfetti Poteet 06/08/2013,12:22 PM

## 2013-06-08 NOTE — ED Notes (Signed)
Pt mentating well, alert and oriented x 4 stating "i am hungry".  Residents continue to be at bedside.

## 2013-06-08 NOTE — ED Notes (Signed)
Residents at bedside performing bedside ultrasound.

## 2013-06-08 NOTE — ED Provider Notes (Signed)
CSN: [id number]     Arrival date & time 06/08/13  [id number] History   First MD Initiated Contact with Patient 06/08/13 [phone]     Chief Complaint  Patient presents with  . Shortness of Breath   HPI  73 y/o female with history as noted below who presents from her nursing home with cc of respiratory distress. Was recently discharged for a hospital stay in which she had a para pneumonic effusion and a thoracentesis. She was discharged to a nursing home and had been doing well until the last 24 hours when she developed shortness of breath and increased work of breathing. The patient has had associated coughs but denies fevers. She is having bilateral chest tightness.      Past Medical History  Diagnosis Date  . Pneumonia   . Pleural effusion   . CHF (congestive heart failure)   . Paroxysmal atrial fibrillation   . COPD (chronic obstructive pulmonary disease)   . Stroke   . Dysarthria due to cerebrovascular accident   . 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 Coronary artery disease   . Diabetes mellitus without complication   . GERD (gastroesophageal reflux disease)   . Dementia    Past Surgical History  Procedure Laterality Date  . Thoracostomy    . Thoracentesis     No family history on file. History  Substance Use Topics  . Smoking status: Never Smoker   . Smokeless tobacco: Not on file  . Alcohol Use: No   OB History   Grav Para Term Preterm Abortions TAB SAB Ect Mult Living                 Review of Systems  Constitutional: Negative for fever and chills.  Respiratory: Positive for cough, shortness of breath and wheezing.   Cardiovascular: Positive for chest pain.  Gastrointestinal: Negative for nausea and vomiting.  Genitourinary: Negative for dysuria and frequency.  All other systems reviewed and are negative.   Allergies  Baclofen and Clindamycin/lincomycin  Home Medications   No current outpatient prescriptions on file. BP  94/63  Pulse 85  Temp(Src) 98.1 F (36.7 C) (Oral)  Resp 17  SpO2 99% Physical Exam  Nursing note and vitals reviewed. Constitutional: She is oriented to person, place, and time. She appears well-developed and well-nourished. No distress.  HENT:  Head: Normocephalic and atraumatic.  Eyes: Conjunctivae are normal. Pupils are equal, round, and reactive to light.  Neck: Normal range of motion. Neck supple.  Cardiovascular: Normal rate and regular rhythm.  Exam reveals no gallop and no friction rub.   No murmur heard. Pulmonary/Chest: Tachypnea (mild) noted. She has decreased breath sounds in the right lower field and the left lower field. She has wheezes in the right upper field and the left upper field.  On albuterol mask  Abdominal: Soft. She exhibits no distension. There is no tenderness.  Musculoskeletal: Normal range of motion. She exhibits no edema and no tenderness.  Neurological: She is alert and oriented to person, place, and time. She has normal strength and normal reflexes. No cranial nerve deficit or sensory deficit.  Skin: Skin is warm and dry.  Psychiatric: She has a normal mood and affect.   ED Course  Procedures (including critical care time) Labs Review Labs Reviewed  CBC WITH DIFFERENTIAL - Abnormal; Notable for the following:    Hemoglobin 10.1 (*)    HCT 32.4 (*)    MCH 25.5 (*)  RDW 18.4 (*)    All other components within normal limits  PRO B NATRIURETIC PEPTIDE - Abnormal; Notable for the following:    Pro B Natriuretic peptide (BNP) 21847.0 (*)    All other components within normal limits  PROTIME-INR - Abnormal; Notable for the following:    Prothrombin Time 20.2 (*)    INR 1.78 (*)    All other components within normal limits  URINALYSIS, ROUTINE W REFLEX MICROSCOPIC - Abnormal; Notable for the following:    APPearance CLOUDY (*)    Glucose, UA 100 (*)    Hgb urine dipstick SMALL (*)    Protein, ur >300 (*)    All other components within normal  limits  URINE MICROSCOPIC-ADD ON - Abnormal; Notable for the following:    Bacteria, UA FEW (*)    All other components within normal limits  CBC - Abnormal; Notable for the following:    RBC 3.79 (*)    Hemoglobin 9.7 (*)    HCT 31.5 (*)    MCH 25.6 (*)    RDW 18.4 (*)    All other components within normal limits  CREATININE, SERUM - Abnormal; Notable for the following:    Creatinine, Ser 1.93 (*)    GFR calc non Af Amer 25 (*)    GFR calc Af Amer 29 (*)    All other components within normal limits  POCT I-STAT, CHEM 8 - Abnormal; Notable for the following:    BUN 30 (*)    Creatinine, Ser 1.90 (*)    Glucose, Bld 132 (*)    All other components within normal limits  POCT I-STAT TROPONIN I - Abnormal; Notable for the following:    Troponin i, poc 0.09 (*)    All other components within normal limits  POCT I-STAT 3, BLOOD GAS (G3P V) - Abnormal; Notable for the following:    pO2, Ven 26.0 (*)    Acid-base deficit 6.0 (*)    All other components within normal limits  CULTURE, BLOOD (ROUTINE X 2)  CULTURE, BLOOD (ROUTINE X 2)  MRSA PCR SCREENING  TROPONIN I  TROPONIN I  PROTIME-INR  BASIC METABOLIC PANEL  CBC  TSH  TROPONIN I  TROPONIN I  CG4 I-STAT (LACTIC ACID)   Imaging Review Dg Chest 1 View  06/08/2013   CLINICAL DATA:  73 year old female status post right thoracentesis. Initial encounter.  EXAM: CHEST - 1 VIEW  COMPARISON:  0831 hr the same day and earlier.  FINDINGS: Portable AP semi upright view at at 1235 hrs. Improved lung volumes. Improved right lung base ventilation. No pneumothorax identified. Residual opacity at the left lung base. Stable cardiac size and mediastinal contours.  IMPRESSION: Improved ventilation and decreased right lung base opacity with no pneumothorax status post right thoracentesis.   Electronically Signed   By: Augusto Gamble M.D.   On: 06/08/2013 12:59   Dg Chest Port 1 View  06/08/2013   CLINICAL DATA:  Short of breath.  EXAM: PORTABLE CHEST -  1 VIEW  COMPARISON:  06/08/2013.  FINDINGS: Cardiomegaly. Right upper extremity PICC is present with the tip in the mid SVC. Aortic arch atherosclerosis. Bilateral basilar airspace disease and atelectasis is present. This is favored to represent pulmonary edema. Small bilateral pleural effusions are present. Thoracolumbar fixation hardware incidentally noted. Monitoring leads project over the chest.  Compared to the prior, basilar aeration has worsened slightly.  IMPRESSION: Constellation of findings compatible with progressive mild CHF.   Electronically Signed  By: Andreas Newport M.D.   On: 06/08/2013 17:15   Dg Chest Port 1 View  06/08/2013   CLINICAL DATA:  Chest pain and shortness of breath  EXAM: PORTABLE CHEST - 1 VIEW  COMPARISON:  Prior chest x-ray 06/08/2013  FINDINGS: New right upper extremity approach PICC. Catheter tip in good position at the distal SVC. Stable cardiac and mediastinal contours with mild cardiomegaly. Improving diffuse interstitial opacities. Small left and possibly trace right pleural effusions. The left effusion is stable to slightly smaller compared to prior. Atherosclerotic calcification again noted in the transverse aorta. No pneumothorax.  IMPRESSION: 1. Improving bilateral interstitial opacities may reflect resolving interstitial edema, or atypical infection. 2. Improving small left pleural effusion. Probable trace right pleural effusion as well. 3. Right upper extremity PICC with the catheter tip projecting over the distal SVC.   Electronically Signed   By: Malachy Moan M.D.   On: 06/08/2013 14:46   Dg Chest Portable 1 View  06/08/2013   CLINICAL DATA:  73 year old female shortness of Breath weakness and confusion. Initial encounter.  EXAM: PORTABLE CHEST - 1 VIEW  COMPARISON:  06/02/2013 and earlier.  FINDINGS: Portable AP semi upright view at 0831 hrs. Lower lung volumes. Increased crowding of markings at both bases. No pneumothorax or large effusion, but small  effusions are suspected. Decreased pulmonary vascularity without overt edema. Stable cardiac size and mediastinal contours. Partially visible lumbar fusion hardware.  IMPRESSION: 1. Lower lung volumes with increased bibasilar opacity, favor atelectasis. 2. Small bilateral pleural effusions are suspected. No overt pulmonary edema.   Electronically Signed   By: Augusto Gamble M.D.   On: 06/08/2013 08:40   US Thoracentesis Asp Pleural Space W/img Guide  06/08/2013   CLINICAL DATA:  Congestive heart failure, shortness of breath. Recurrent right-sided pleural effusion. Request therapeutic thoracentesis.  EXAM: ULTRASOUND GUIDED right THORACENTESIS  COMPARISON:  Previous thoracentesis  FINDINGS: A total of approximately 1.2 L of clear yellow fluid was removed. A fluid sample was notsent for laboratory analysis.  IMPRESSION: Successful ultrasound guided right thoracentesis yielding 1.2 L of pleural fluid.\  Read by: Brayton El PA-C  PROCEDURE: An ultrasound guided thoracentesis was thoroughly discussed with the patient and questions answered. The benefits, risks, alternatives and complications were also discussed. The patient understands and wishes to proceed with the procedure. Written consent was obtained.  Ultrasound was performed to localize and mark an adequate pocket of fluid in the right chest. The area was then prepped and draped in the normal sterile fashion. 1% Lidocaine was used for local anesthesia. Under ultrasound guidance a 19 gauge Yueh catheter was introduced. Thoracentesis was performed. The catheter was removed and a dressing applied.  Complications:  None.   Electronically Signed   By: Richarda Overlie M.D.   On: 06/08/2013 13:20    EKG Interpretation    Date/Time:  Monday June 08 2013 08:30:17 EST Ventricular Rate:  83 PR Interval:    QRS Duration: 119 QT Interval:  410 QTC Calculation: 482 R Axis:   -61 Text Interpretation:  Atrial fibrillation Ventricular premature complex Incomplete RBBB  and LAFB Probable left ventricular hypertrophy Probable posterior infarct, acute Similar to prior, no acute MI Confirmed by Gwendolyn Grant  MD, BLAIR ([id number]) on 06/08/2013 8:35:56 AM           MDM   Here with SOB. Afebrile. Mild tachypnea. Normal Sats on home 3 liters. EKG without acute ischemic changes. Doubt PE given no chest pain and anticoagulated state. CXR with bilateral  R >L pleural effusion. Likely secondary to heart failure but can't r/o infectious component. Levaquin given after obtaining cultures. 40 mg of lasix given IV. The patient was admitted to internal medicine for continued workup and management.   The patient went for thoracentesis and had 1.2L removed. After the procedure the patient had improvement in her work of breathing. I was called to the bedside by the patient's nurse at 16:40 due to low blood pressure. BP obtained by nursing with reading of 78/48. She was asymptomatic. She was mentating at her baseline. Repeat CXR obtained to evaluate for pneumothorax which was negative. Hypotension likely secondary to diuresis.  500 cc fluid bolus ordered. Admitting physician called and updated and will also come evaluate the patient.    1. Shortness of breath   2. Acute diastolic heart failure   3. Paroxysmal atrial fibrillation   4. Acute respiratory failure   5. Pleural effusion        Shanon Ace, MD 06/08/13 2010

## 2013-06-08 NOTE — Consult Note (Addendum)
Reason for Consult: CHF Referring Physician:   [name] is an 73 y.o. female.  HPI:   The patient is a 73 yo female with a history of CHF, right pleural effusion, PAF on coumadin, CAD, COPD, DM, GERD, dementia, CVA with right hemiparesis.  The patient's EF in December 2014 was read and normal or mildly decreased, severe LVH, LA moderately dilated.  [facility]  Was at nursing home and brought into ER for increased respitory distress.  CXR with recurent bilateral effusion. Right greater than left  Currently in [place] for guided thoracentesis.  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  Somewhat somnolent  in [place] She has been seen in [place] as well for pneumonia and efusions.  No fever.  Notes indicate worsening respitory status over last 24 hours.  No sputum.  No mention of aspiration but she is at risk given CVA and aphasia.  Chronic afib with Rx INR this admission.    BNP is severely elevated at 21,847.    Past Medical History  Diagnosis Date  . Pneumonia   . Pleural effusion   . CHF (congestive heart failure)   . Paroxysmal atrial fibrillation   . COPD (chronic obstructive pulmonary disease)   . Stroke   . Dysarthria due to cerebrovascular accident   . 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 Coronary artery disease   . Diabetes mellitus without complication   . GERD (gastroesophageal reflux disease)   . Dementia     Past Surgical History  Procedure Laterality Date  . Thoracostomy    . Thoracentesis      No family history on file.  Social History:  reports that she has never smoked. She does not have any smokeless tobacco history on file. She reports that she does not drink alcohol or use illicit drugs.  Allergies:  Allergies  Allergen Reactions  . Baclofen   .  Clindamycin/Lincomycin     Medications:  Prior to Admission medications   Medication Sig Start Date End Date Taking? Authorizing Provider  acetaminophen (TYLENOL) 325 MG tablet Take 650 mg by mouth every 4 (four) hours as needed (for pain).   Yes Historical Provider, MD  albuterol (PROVENTIL) (2.5 MG/3ML) 0.083% nebulizer solution Take 2.5 mg by nebulization every 6 (six) hours as needed for wheezing or shortness of breath.   Yes Historical Provider, MD  aspirin (ASPIRIN ADULT LOW STRENGTH) 81 MG chewable tablet Chew 81 mg by mouth daily.   Yes Historical Provider, MD  atorvastatin (LIPITOR) 20 MG tablet Take 20 mg by mouth at bedtime.   Yes Historical Provider, MD  clonazePAM (KLONOPIN) 0.5 MG tablet Take 0.5 mg by mouth every morning.   Yes Historical Provider, MD  clonazePAM (KLONOPIN) 1 MG tablet Take 1 mg by mouth at bedtime.   Yes Historical Provider, MD  docusate sodium (COLACE) 100 MG capsule Take 100 mg by mouth 2 (two) times daily.   Yes Historical Provider, MD  famotidine (PEPCID) 20 MG tablet Take 20 mg by mouth 2 (two) times daily.   Yes Historical Provider, MD  furosemide (LASIX) 80 MG tablet Take 80 mg by mouth every morning.   Yes Historical Provider, MD  insulin aspart (NOVOLOG) 100 UNIT/ML injection Inject 0-6 Units into the skin 3 (three) times daily before  meals. CBG less than 200=0 units 200-250=2 units 251-300=4 units Greater than 300=6 units   Yes Historical Provider, MD  insulin detemir (LEVEMIR) 100 UNIT/ML injection Inject 7 Units into the skin 2 (two) times daily.   Yes Historical Provider, MD  ipratropium (ATROVENT) 0.02 % nebulizer solution Take 0.5 mg by nebulization every 6 (six) hours as needed for wheezing or shortness of breath.   Yes Historical Provider, MD  metoprolol tartrate (LOPRESSOR) 25 MG tablet Take 25 mg by mouth 2 (two) times daily.   Yes Historical Provider, MD  Multiple Vitamins-Minerals (MULTIVITAMINS THER. W/MINERALS) TABS tablet Take 1 tablet by  mouth daily.   Yes Historical Provider, MD  potassium chloride (K-DUR,KLOR-CON) 10 MEQ tablet Take 10 mEq by mouth daily.   Yes Historical Provider, MD  senna-docusate (SENOKOT-S) 8.6-50 MG per tablet Take 1 tablet by mouth at bedtime.   Yes Historical Provider, MD  sennosides-docusate sodium (SENOKOT-S) 8.6-50 MG tablet Take 1 tablet by mouth daily as needed for constipation ((in addition to daily scheduled dose)).   Yes Historical Provider, MD  traZODone (DESYREL) 50 MG tablet Take 50 mg by mouth at bedtime.   Yes Historical Provider, MD  venlafaxine XR (EFFEXOR-XR) 75 MG 24 hr capsule Take 75 mg by mouth daily.   Yes Historical Provider, MD  warfarin (COUMADIN) 1 MG tablet Take 1.5 mg by mouth at bedtime.   Yes Historical Provider, MD     Results for orders placed during the hospital encounter of 06/08/13 (from the past 48 hour(s))  CBC WITH DIFFERENTIAL     Status: Abnormal   Collection Time    06/08/13  8:42 AM      Result Value Range   WBC 7.8  4.0 - 10.5 K/uL   RBC 3.96  3.87 - 5.11 MIL/uL   Hemoglobin 10.1 (*) 12.0 - 15.0 g/dL   HCT 16.1 (*) 09.6 - 04.5 %   MCV 81.8  78.0 - 100.0 fL   MCH 25.5 (*) 26.0 - 34.0 pg   MCHC 31.2  30.0 - 36.0 g/dL   RDW 40.9 (*) 81.1 - 91.4 %   Platelets 216  150 - 400 K/uL   Neutrophils Relative % 74  43 - 77 %   Neutro Abs 5.8  1.7 - 7.7 K/uL   Lymphocytes Relative 19  12 - 46 %   Lymphs Abs 1.5  0.7 - 4.0 K/uL   Monocytes Relative 6  3 - 12 %   Monocytes Absolute 0.5  0.1 - 1.0 K/uL   Eosinophils Relative 1  0 - 5 %   Eosinophils Absolute 0.1  0.0 - 0.7 K/uL   Basophils Relative 0  0 - 1 %   Basophils Absolute 0.0  0.0 - 0.1 K/uL  PRO B NATRIURETIC PEPTIDE     Status: Abnormal   Collection Time    06/08/13  8:42 AM      Result Value Range   Pro B Natriuretic peptide (BNP) 21847.0 (*) 0 - 125 pg/mL  PROTIME-INR     Status: Abnormal   Collection Time    06/08/13  8:42 AM      Result Value Range   Prothrombin Time 20.2 (*) 11.6 - 15.2  seconds   INR 1.78 (*) 0.00 - 1.49  POCT I-STAT TROPONIN I     Status: Abnormal   Collection Time    06/08/13  8:55 AM      Result Value Range   Troponin i, poc 0.09 (*) 0.00 -  0.08 ng/mL   Comment NOTIFIED PHYSICIAN     Comment 3            Comment: Due to the release kinetics of cTnI,     a negative result within the first hours     of the onset of symptoms does not rule out     myocardial infarction with certainty.     If myocardial infarction is still suspected,     repeat the test at appropriate intervals.  POCT I-STAT, CHEM 8     Status: Abnormal   Collection Time    06/08/13  8:57 AM      Result Value Range   Sodium 140  137 - 147 mEq/L   Potassium 4.7  3.7 - 5.3 mEq/L   Chloride 107  96 - 112 mEq/L   BUN 30 (*) 6 - 23 mg/dL   Creatinine, Ser 1.301.90 (*) 0.50 - 1.10 mg/dL   Glucose, Bld 865132 (*) 70 - 99 mg/dL   Calcium, Ion 7.841.22  6.961.13 - 1.30 mmol/L   TCO2 22  0 - 100 mmol/L   Hemoglobin 12.6  12.0 - 15.0 g/dL   HCT 29.537.0  28.436.0 - 13.246.0 %  CG4 I-STAT (LACTIC ACID)     Status: None   Collection Time    06/08/13  8:57 AM      Result Value Range   Lactic Acid, Venous 2.13  0.5 - 2.2 mmol/L    Dg Chest Portable 1 View  06/08/2013   CLINICAL DATA:  73 year old female shortness of Breath weakness and confusion. Initial encounter.  EXAM: PORTABLE CHEST - 1 VIEW  COMPARISON:  06/02/2013 and earlier.  FINDINGS: Portable AP semi upright view at 0831 hrs. Lower lung volumes. Increased crowding of markings at both bases. No pneumothorax or large effusion, but small effusions are suspected. Decreased pulmonary vascularity without overt edema. Stable cardiac size and mediastinal contours. Partially visible lumbar fusion hardware.  IMPRESSION: 1. Lower lung volumes with increased bibasilar opacity, favor atelectasis. 2. Small bilateral pleural effusions are suspected. No overt pulmonary edema.   Electronically Signed   By: Augusto GambleLee  Hall M.D.   On: 06/08/2013 08:40    ROS  No chest pain fever or  sputum  Poor functional status    ECG:  afib PVC;s RBBB no acute ischemic changes  Echo:  12.12.14 Study Conclusions  - Procedure narrative: Transthoracic echocardiography. Image quality was adequate. The study was technically difficult. - Left ventricle: Poor acoustic windows. Overall LVEF appears normal to mildly decreased. The cavity size was normal. Wall thickness was increased in a pattern of severe LVH. - Aortic valve: Mild regurgitation. - Mitral valve: Mild regurgitation. - Left atrium: The atrium was moderately dilated. - Right ventricle: RV is not seen well enough to evaluate function.    Blood pressure 143/94, pulse 83, temperature 98.6 F (37 C), SpO2 90.00%.  Physical Exam  Lethargic  Chronically ill with aphasia and right hemiparesis  HEENT: normal Neck supple with no adenopathy JVP normal no bruits no thyromegaly Lungs poor inspitory effort bilateral rales and decreased bassilar breath sounds right greater than left  Heart:  S1/S2 no murmur, no rub, gallop or click PMI normal Abdomen: benighn, BS positve, no tenderness, no AAA no bruit.  No HSM or HJR Distal pulses intact with no bruits No edema Neuro apahsia and right hemiparesis  Skin warm and dry No muscular weakness  Assessment/Plan: Diastolic CHF:  BUN and Cr elevated 1.9  Normally on lasix 80mg   daily.  Loss of atrial kick with afib and moderate LVH may predispose to diastolic dysfunction  Would use lasix 40 iv bid for now.   Effusions:  Recurrent transudative.  Consider CVTS consult for pleurX catheter.  Having US guided thoracentesis now Afib:  Continue beta blocker and coumadin anticoagulation CVA:  Consider swallowing study to r/o aspiration.   Pulm:  sats ok but lethargic ABG pending DNR  Antibiotics at discretion of primary service  Check UA  Charlton Haws 12:19 PM 06/08/2013

## 2013-06-08 NOTE — ED Notes (Signed)
Results of troponin given to Dr. Walden 

## 2013-06-08 NOTE — Procedures (Addendum)
Moderate sized effusion on right. Trace effusion on left not amenable for thoracentesis. Successful US guided right thoracentesis. Yielded 1.2L of clear yellow fluid. Pt tolerated procedure well. No immediate complications.  Specimen was not sent for labs. CXR ordered.  Brayton ElBRUNING, Jashan Cotten PA-C 06/08/2013 12:20 PM

## 2013-06-08 NOTE — H&P (Addendum)
. [facility] History and Physical                                                                                    Patient Demographics  Ariana [name], is a 73 y.o. female  MRN: [id number]   DOB - [birth year]  Admit Date - 06/08/2013  Outpatient Primary MD for the patient is Garlan Fillers, MD   With History of -  Past Medical History  Diagnosis Date  . Pneumonia   . Pleural effusion   . CHF (congestive heart failure)   . Paroxysmal atrial fibrillation   . COPD (chronic obstructive pulmonary disease)   . Stroke   . Dysarthria due to cerebrovascular accident   . 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 Coronary artery disease   . Diabetes mellitus without complication   . GERD (gastroesophageal reflux disease)   . Dementia       Past Surgical History  Procedure Laterality Date  . Thoracostomy    . Thoracentesis      in for   Chief Complaint  Patient presents with  . Shortness of Breath     HPI  Ariana [name]  is a 73 y.o. female, with a Hx of CHF, COPD, CAD, DM, Dementia, and recurrent transudative pleural effusion. She presents to the ED in acute hypoxic respiratory failure with SOB and dyspnea.  She is currently requiring 4L of oxygen, she normally requires 2L at SNF.  She has been hospitalized 2X in [place] and 2X at [facility] with recurrent pleural effusions felt to be due to CHF.  A right pleural drain catheter was placed by IR  on 11/24, but pulled out at bedside on 11/30 due to pain when the patient was in [facility].  Her son and daughter in law state that she was "fine" on Saturday 1/10, but symptoms began on Sunday 1/11.  She began to deteriorate progressively with SOB till admission to ED on 1/12. Family states that she currently has no cardiologist or pulmonologist.  In the emergency department she appears pale, fatigued and has increased work of breathing.  CXR shows bibasilar opacities favored  to be atelectasis with small bilateral pleural effusions.  On exam the patient has significantly decreased breath sounds and a positive hepatojugular reflex.  Her BNP is 21847.  She does not appear to have signs or symptoms of infection.      Social History History  Substance Use Topics  . Smoking status: Previous Smoker approximately 25 year pack history.  . Smokeless tobacco: Not on file  . Alcohol Use: No     Family History Patient tells me she is unable to recall how her parents pasted away.  Her son is at bedside and reports he is in good health.  Prior to Admission medications   Medication Sig Start Date End Date Taking? Authorizing Provider  acetaminophen (TYLENOL) 325 MG tablet Take 650 mg by mouth every 4 (four) hours as needed (for pain).   Yes Historical Provider, MD  albuterol (PROVENTIL) (2.5 MG/3ML) 0.083% nebulizer solution Take 2.5 mg by nebulization every 6 (six) hours as needed for wheezing  or shortness of breath.   Yes Historical Provider, MD  aspirin (ASPIRIN ADULT LOW STRENGTH) 81 MG chewable tablet Chew 81 mg by mouth daily.   Yes Historical Provider, MD  atorvastatin (LIPITOR) 20 MG tablet Take 20 mg by mouth at bedtime.   Yes Historical Provider, MD  clonazePAM (KLONOPIN) 0.5 MG tablet Take 0.5 mg by mouth every morning.   Yes Historical Provider, MD  clonazePAM (KLONOPIN) 1 MG tablet Take 1 mg by mouth at bedtime.   Yes Historical Provider, MD  docusate sodium (COLACE) 100 MG capsule Take 100 mg by mouth 2 (two) times daily.   Yes Historical Provider, MD  famotidine (PEPCID) 20 MG tablet Take 20 mg by mouth 2 (two) times daily.   Yes Historical Provider, MD  furosemide (LASIX) 80 MG tablet Take 80 mg by mouth every morning.   Yes Historical Provider, MD  insulin aspart (NOVOLOG) 100 UNIT/ML injection Inject 0-6 Units into the skin 3 (three) times daily before meals. CBG less than 200=0 units 200-250=2 units 251-300=4 units Greater than 300=6 units   Yes  Historical Provider, MD  insulin detemir (LEVEMIR) 100 UNIT/ML injection Inject 7 Units into the skin 2 (two) times daily.   Yes Historical Provider, MD  ipratropium (ATROVENT) 0.02 % nebulizer solution Take 0.5 mg by nebulization every 6 (six) hours as needed for wheezing or shortness of breath.   Yes Historical Provider, MD  metoprolol tartrate (LOPRESSOR) 25 MG tablet Take 25 mg by mouth 2 (two) times daily.   Yes Historical Provider, MD  Multiple Vitamins-Minerals (MULTIVITAMINS THER. W/MINERALS) TABS tablet Take 1 tablet by mouth daily.   Yes Historical Provider, MD  potassium chloride (K-DUR,KLOR-CON) 10 MEQ tablet Take 10 mEq by mouth daily.   Yes Historical Provider, MD  senna-docusate (SENOKOT-S) 8.6-50 MG per tablet Take 1 tablet by mouth at bedtime.   Yes Historical Provider, MD  sennosides-docusate sodium (SENOKOT-S) 8.6-50 MG tablet Take 1 tablet by mouth daily as needed for constipation ((in addition to daily scheduled dose)).   Yes Historical Provider, MD  traZODone (DESYREL) 50 MG tablet Take 50 mg by mouth at bedtime.   Yes Historical Provider, MD  venlafaxine XR (EFFEXOR-XR) 75 MG 24 hr capsule Take 75 mg by mouth daily.   Yes Historical Provider, MD  warfarin (COUMADIN) 1 MG tablet Take 1.5 mg by mouth at bedtime.   Yes Historical Provider, MD    Allergies  Allergen Reactions  . Baclofen   . Clindamycin/Lincomycin     Physical Exam  Vitals  Blood pressure 112/85, pulse 83, temperature 98.6 F (37 C), resp. rate 23, SpO2 95.00%.   General: Lying in bed in moderate distress. Fatigued, pale color, has difficulty speaking.  Pt looks older than stated age  Psych:  Awake. Oriented to place. She lacks complete mental status  ENT:  Ears and Eyes appear Normal,Moist Oral Mucosa with no erythema  Neck:  Supple Neck, + heptojugular distention, no lymphadenopathy  Respiratory:   shortened breath sounds, mild wheeze on L posterior and fluid overloaded, moderate work of  breathing.  Abdomen:  Positive Bowel Sounds, soft, nt, nd, no obvious masses.  Skin:  Pt had anasarca with bruising on arms.  Otherwise no rash or lesions.  Extremities:  1+ edema in LE bilaterally.   Data Review  CBC  Recent Labs Lab 06/03/13 0505 06/08/13 0842 06/08/13 0857  WBC 6.7 7.8  --   HGB 9.7* 10.1* 12.6  HCT 32.3* 32.4* 37.0  PLT 210 216  --  MCV 83.9 81.8  --   MCH 25.2* 25.5*  --   MCHC 30.0 31.2  --   RDW 19.5* 18.4*  --   LYMPHSABS  --  1.5  --   MONOABS  --  0.5  --   EOSABS  --  0.1  --   BASOSABS  --  0.0  --    ------------------------------------------------------------------------------------------------------------------  Chemistries   Recent Labs Lab 06/02/13 0500 06/03/13 0505 06/08/13 0857  NA 138 139 140  K 4.3 4.3 4.7  CL 108 107 107  CO2 20 19  --   GLUCOSE 113* 121* 132*  BUN 36* 33* 30*  CREATININE 2.02* 1.89* 1.90*  CALCIUM 7.9* 8.7  --      Coagulation profile  Recent Labs Lab 06/02/13 0500 06/03/13 0505 06/08/13 0842  INR 2.08* 2.17* 1.78*     ---------------------------------------------------------------------------------------------------------------  Urinalysis    Component Value Date/Time   COLORURINE YELLOW 05/19/2013 1658   APPEARANCEUR CLEAR 05/19/2013 1658   LABSPEC 1.014 05/19/2013 1658   PHURINE 7.0 05/19/2013 1658   GLUCOSEU 100* 05/19/2013 1658   HGBUR SMALL* 05/19/2013 1658   BILIRUBINUR NEGATIVE 05/19/2013 1658   KETONESUR NEGATIVE 05/19/2013 1658   PROTEINUR >300* 05/19/2013 1658   UROBILINOGEN 0.2 05/19/2013 1658   NITRITE NEGATIVE 05/19/2013 1658   LEUKOCYTESUR NEGATIVE 05/19/2013 1658    ----------------------------------------------------------------------------------------------------------------  Imaging results:   Ct Head Wo Contrast  05/30/2013   CLINICAL DATA:  History of CVA.  EXAM: CT HEAD WITHOUT CONTRAST  TECHNIQUE: Contiguous axial images were obtained from the base of  the skull through the vertex without intravenous contrast.  COMPARISON:  None.  FINDINGS: Examination demonstrates evidence of an old large left MCA territory infarct and left occipital infarct. There is chronic ischemic microvascular disease present. The ventricles, cisterns and remaining CSF spaces are otherwise unremarkable. Possible old sub cm focal infarct over the right cerebellum. There is no mass, mass effect, shift of midline structures or acute hemorrhage. Remaining bones and soft tissues are unremarkable.  IMPRESSION: No acute intracranial findings.  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   Electronically Signed   By: Elberta Fortis M.D.   On: 05/30/2013 19:00   Ct Chest Wo Contrast  05/30/2013   CLINICAL DATA:  Recurrent pleural effusion.  Shortness of breath.  EXAM: CT CHEST WITHOUT CONTRAST  TECHNIQUE: Multidetector CT imaging of the chest was performed following the standard protocol without IV contrast.  COMPARISON:  04/26/2013  FINDINGS: Moderate right pleural effusion has increased in size since previous study. Small left pleural effusion shows no significant change.  Moderate cardiomegaly and tiny pericardial effusion are stable. Diffuse pulmonary interstitial infiltrates show no significant change, and are suspicious for diffuse interstitial edema or atypical infection. Compressive atelectasis is seen bilaterally related to the pleural effusions, but there is no evidence of focal pulmonary consolidation.  Shotty subcentimeter mediastinal lymph nodes are stable. Calcified bilateral hilar lymph nodes are again seen, consistent with old granulomatous disease. A benign left adrenal adenoma remains stable.  IMPRESSION: Stable cardiomegaly, tiny pericardial effusion, and diffuse interstitial infiltrates suspicious for diffuse interstitial edema or atypical infection.  Increased size of moderate right pleural effusion. Stable small left  pleural effusion.  Stable benign left adrenal adenoma.   Electronically Signed   By: Myles Rosenthal M.D.   On: 05/30/2013 19:09   Dg Chest Portable 1 View  06/08/2013   CLINICAL DATA:  73 year old female shortness of Breath  weakness and confusion. Initial encounter.  EXAM: PORTABLE CHEST - 1 VIEW  COMPARISON:  06/02/2013 and earlier.  FINDINGS: Portable AP semi upright view at 0831 hrs. Lower lung volumes. Increased crowding of markings at both bases. No pneumothorax or large effusion, but small effusions are suspected. Decreased pulmonary vascularity without overt edema. Stable cardiac size and mediastinal contours. Partially visible lumbar fusion hardware.  IMPRESSION: 1. Lower lung volumes with increased bibasilar opacity, favor atelectasis. 2. Small bilateral pleural effusions are suspected. No overt pulmonary edema.   Electronically Signed   By: Augusto GambleLee  Hall M.D.   On: 06/08/2013 08:40   Dg Chest Port 1 View  06/02/2013   CLINICAL DATA:  Followup effusions  EXAM: PORTABLE CHEST - 1 VIEW  COMPARISON:  05/31/2013  FINDINGS: Cardiac shadow remains enlarged. Calcification of the thoracic aorta is again identified. Bilateral small effusions are noted right slightly greater than left. The right effusion has recurred in the interval from the prior exam. No focal confluent infiltrate is seen. Mild central vascular congestion is noted. Postoperative changes in the lumbar spine are seen. A left-sided PICC line is noted with the tip in the left innominate vein and stable.  IMPRESSION: Recurrent right-sided pleural effusion. The remainder of the exam is stable from the prior study.   Electronically Signed   By: Alcide CleverMark  Lukens M.D.   On: 06/02/2013 08:03   Dg Chest Port 1 View  05/31/2013   ADDENDUM REPORT: 05/31/2013 09:31  ADDENDUM: This examination was reviewed with attention to the specific location of the left arm PICC. Chest CT 05/30/2013 and chest radiographs ranging from 05/08/2013 through 05/30/2013 are correlated.  The PICC tip is superior to the aortic arch, within the left brachiocephalic vein on recent CT. The tip has retracted from the upper SVC compared with the radiographs obtained 3 weeks ago.   Electronically Signed   By: Roxy HorsemanBill  Veazey M.D.   On: 05/31/2013 09:31   05/31/2013   CLINICAL DATA:  Status post thoracentesis  EXAM: PORTABLE CHEST - 1 VIEW  COMPARISON:  Prior chest CT from 05/30/2013  FINDINGS: Cardiomegaly is stable as compared to prior exam. Mediastinal silhouette within normal limits. Left PICC catheter is unchanged.  Lungs are normally inflated. There has been interval improvement in previously seen right pleural effusion, consistent with history of thoracentesis. No definite pneumothorax identified. A small residual right pleural effusion persists. Left-sided pleural effusion is similar.  There is diffuse pulmonary vascular congestion without frank pulmonary edema. No definite focal infiltrate. Linear left basilar atelectasis is noted.  IMPRESSION: 1. No pneumothorax identified status post thoracentesis. A small residual right pleural effusion persist. 2. Grossly stable left-sided pleural effusion. 3. Cardiomegaly with diffuse pulmonary vascular congestion.  Electronically Signed: By: Rise MuBenjamin  McClintock M.D. On: 05/31/2013 04:55   Dg Chest Portable 1 View  05/30/2013   CLINICAL DATA:  Shortness of breath.  CHF.  EXAM: PORTABLE CHEST - 1 VIEW  COMPARISON:  05/20/2013  FINDINGS: PICC line tip is in the left innominate vein region. Diffuse interstitial densities suggest pulmonary edema. Increased densities in the right lower chest could represent layering pleural fluid. Heart size appears to be upper limits of normal. No evidence for a pneumothorax.  IMPRESSION: Increased densities in the right lower chest may represent enlarging pleural effusion.  Interstitial pulmonary edema.  PICC line tip in the left innominate vein region.   Electronically Signed   By: Richarda OverlieAdam  Henn M.D.   On: 05/30/2013 13:30   Dg  Chest Port 1  View  05/20/2013   CLINICAL DATA:  Respiratory failure.  EXAM: PORTABLE CHEST - 1 VIEW  COMPARISON:  05/18/2013.  FINDINGS: PICC line in stable position with its tip in the brachiocephalic vein on the left. Persistent severe cardiomegaly. Mild pulmonary vascular prominence cannot be excluded. Persistent bilateral pulmonary interstitial prominence with bilateral pleural effusions. These findings have improved slightly from prior exam. No pneumothorax. Bilateral subclavian artery atherosclerotic vascular disease. Carotid atherosclerotic vascular disease.  IMPRESSION: 1. Persistent congestive heart failure with pulmonary interstitial edema and bilateral pleural effusions. These findings have improved from prior exam.  2. Bilateral dense carotid and subclavian artery calcification indicating atherosclerotic vascular disease .   Electronically Signed   By: Maisie Fus  Register   On: 05/20/2013 08:01   Dg Chest Port 1 View  05/18/2013   CLINICAL DATA:  Reassess pleural effusion.  EXAM: PORTABLE CHEST - 1 VIEW  COMPARISON:  May 15, 2013.  FINDINGS: The lungs are reasonably well inflated. The hemidiaphragms remain partially obscured. There is a moderate-sized right pleural effusion and smaller left pleural effusion. The pulmonary interstitial markings remain increased but have slightly improved on the right since the earlier study. The cardiopericardial silhouette remains enlarged. The pulmonary vascularity remains indistinct. The PICC line on the left is unchanged in appearance with the tip in the region of the junction of right and left brachiocephalic veins. There is calcification within the visualized portions of the neck which may overlie within the carotid arterial systems bilaterally.  IMPRESSION: There are persistent bilateral pleural effusions greater on the right than on the left. There has been slight interval improvement in the appearance of the pulmonary interstitium on the right. Pulmonary  interstitial edema remains bilaterally.   Electronically Signed   By: David  [place]and   On: 05/18/2013 07:52   Dg Chest Port 1 View  05/15/2013   CLINICAL DATA:  Respiratory failure  EXAM: PORTABLE CHEST - 1 VIEW  COMPARISON:  05/11/2013  FINDINGS: Cardiac shadow is stable. A left-sided PICC line is noted in the innominate vein. Bilateral pleural effusions are noted. Bibasilar atelectasis is likely present as well.  IMPRESSION: No change from the prior exam.   Electronically Signed   By: Alcide Clever M.D.   On: 05/15/2013 08:10   Dg Chest Port 1 View  05/11/2013   CLINICAL DATA:  Respiratory failure.  EXAM: PORTABLE CHEST - 1 VIEW  COMPARISON:  05/08/2013  FINDINGS: Left central line remains in place, unchanged. Right effusion and right airspace disease again noted, unchanged. Increasing left basilar atelectasis or infiltrate. Mild cardiomegaly and vascular congestion.  IMPRESSION: Stable right pleural effusion and right lung airspace disease. Increasing left base atelectasis or infiltrate.   Electronically Signed   By: Charlett Nose M.D.   On: 05/11/2013 08:10    My personal review of EKG: Afib with RBBB, S-T depression in V2, V3 that is not new.    Assessment & Plan  Principal Problem:   Acute diastolic heart failure Active Problems:   Pleural effusion   Paroxysmal atrial fibrillation   Dysarthria due to cerebrovascular accident   Hemiparesis affecting right side as late effect of cerebrovascular accident   GERD (gastroesophageal reflux disease)   Acute respiratory failure    1. Acute hypoxic respiratory failure  -Secondary to acute on chronic diastolic HF, with recurrent pleural effusion  - Fourth admission of same symptoms   - Requiring 4 L of O2 normal is 2L  - Admit to stepdown  - Received Levaquin and solumedrol  in ED.  I do not think this is infection or COPD so I will not continue these.  - Family is ok with Bipap if necessary even though DNR  - Requested IR eval for  thoracentesis  - Discussed with Dr. Cornelius Moras of [facility] who reviewed the CXR and  did not feel this was a surgical issue.  Effusions were not large enough.  Call if needed.  2. acute on chronic diastolic HF  -Preserved EF 05/08/13  - Family was concerned SNF may not have been adhering to fluid/salt restrictions.  They questioned whether or not she was receiving her lasix.  - BNP ~22000, POC troponin is elevated at 0.09, (T1 is WNL)  - start IV Lasix 80mg  BID  - Called cardiology for consult  - Metoprolol continued. Creatinine will not allow for ACEI  - Will cycle troponins  -Daily weights, I and Os, fluid restriction and low Na diet  3. Paroxysmal A-fib  -Currently rate controlled  -On chronic Coumadin and metoprolol  4. Sub therapeutic INR  - Coumadin per pharmacy.   - Heparin prophylaxis   - Hx of DVT  5. CKD Stage 3  - Creatinine is at baseline  6.  Social  -Patient is a DNR / DNI  -Would a goals of care be helpful for the patient and family members?  Consider Palliative consultation.     DVT Prophylaxis Heparin and Coumadin per Pharmacy  AM Labs Ordered, also please review Full Orders  Family Communication:   Son and grand daughter in law   Code Status:  DNR  Likely DC to  SNF  Condition:  Guarded  Time spent in minutes : 60 min    York, Tora Kindred PA-C on 06/08/2013 at 12:30 PM Stappas, Issac, PA-S  Between 7am to 7pm - Pager - [phone]  After 7pm go to [email or web address] - password [email or web address]  And look for the night coverage person covering me after hours  [facility] Office  [phone]  Addendum  Patient seen and examined, chart and data base reviewed.  I agree with the above assessment and plan.  For full details please see Mrs. Algis Downs PA note.   Clint Lipps, MD [facility] Pager: [phone] 06/08/2013, 5:59 PM

## 2013-06-08 NOTE — ED Notes (Signed)
IV team called to come start a line on patient

## 2013-06-08 NOTE — Progress Notes (Signed)
Peripherally Inserted Central Catheter/Midline Placement  The IV Nurse has discussed with the patient and/or persons authorized to consent for the patient, the purpose of this procedure and the potential benefits and risks involved with this procedure.  The benefits include less needle sticks, lab draws from the catheter and patient may be discharged home with the catheter.  Risks include, but not limited to, infection, bleeding, blood clot (thrombus formation), and puncture of an artery; nerve damage and irregular heat beat.  Alternatives to this procedure were also discussed.  PICC/Midline Placement Documentation        [name], Sal Spratley Jeanette 06/08/2013, 1:24 PM Verbal consent obtained by myself and Merleen Millinerhea Duncan, RN, CRNI "I can't write" per patient

## 2013-06-08 NOTE — ED Notes (Signed)
Pt from [facility] staff states resp episodes since early this am was just out of hospital 05/30/13 has hx of chf and  Pleural effusion

## 2013-06-08 NOTE — ED Notes (Signed)
500 cc bolus started on pt per verbal from Dr. Ethelene BrownsBringolf.

## 2013-06-08 NOTE — ED Notes (Signed)
Pt's son at bedside

## 2013-06-08 NOTE — ED Notes (Signed)
Phlebotomy at the bedside getting blood cultures

## 2013-06-08 NOTE — Progress Notes (Signed)
RT unable to obtain ABG, MD notified, VBG okay per Dr. Gwendolyn GrantWalden. Lab notified.

## 2013-06-08 NOTE — ED Notes (Signed)
Lactic acid results shown to C. Lawyer, PA-C

## 2013-06-09 DIAGNOSIS — D649 Anemia, unspecified: Secondary | ICD-10-CM | POA: Diagnosis present

## 2013-06-09 DIAGNOSIS — N183 Chronic kidney disease, stage 3 unspecified: Secondary | ICD-10-CM | POA: Diagnosis present

## 2013-06-09 DIAGNOSIS — E119 Type 2 diabetes mellitus without complications: Secondary | ICD-10-CM | POA: Diagnosis present

## 2013-06-09 DIAGNOSIS — J9611 Chronic respiratory failure with hypoxia: Secondary | ICD-10-CM | POA: Diagnosis present

## 2013-06-09 DIAGNOSIS — I959 Hypotension, unspecified: Secondary | ICD-10-CM | POA: Diagnosis present

## 2013-06-09 LAB — PROTIME-INR
INR: 1.93 — AB (ref 0.00–1.49)
PROTHROMBIN TIME: 21.5 s — AB (ref 11.6–15.2)

## 2013-06-09 LAB — BASIC METABOLIC PANEL
BUN: 32 mg/dL — AB (ref 6–23)
CHLORIDE: 105 meq/L (ref 96–112)
CO2: 21 mEq/L (ref 19–32)
Calcium: 8.5 mg/dL (ref 8.4–10.5)
Creatinine, Ser: 1.99 mg/dL — ABNORMAL HIGH (ref 0.50–1.10)
GFR calc non Af Amer: 24 mL/min — ABNORMAL LOW (ref >90)
GFR, EST AFRICAN AMERICAN: 28 mL/min — AB (ref >90)
Glucose, Bld: 134 mg/dL — ABNORMAL HIGH (ref 70–99)
Potassium: 4.6 mEq/L (ref 3.7–5.3)
Sodium: 139 mEq/L (ref 137–147)

## 2013-06-09 LAB — CBC
HCT: 29.1 % — ABNORMAL LOW (ref 36.0–46.0)
Hemoglobin: 9 g/dL — ABNORMAL LOW (ref 12.0–15.0)
MCH: 25.2 pg — ABNORMAL LOW (ref 26.0–34.0)
MCHC: 30.9 g/dL (ref 30.0–36.0)
MCV: 81.5 fL (ref 78.0–100.0)
Platelets: 174 10*3/uL (ref 150–400)
RBC: 3.57 MIL/uL — ABNORMAL LOW (ref 3.87–5.11)
RDW: 18.4 % — AB (ref 11.5–15.5)
WBC: 6.6 10*3/uL (ref 4.0–10.5)

## 2013-06-09 LAB — GLUCOSE, CAPILLARY
GLUCOSE-CAPILLARY: 126 mg/dL — AB (ref 70–99)
GLUCOSE-CAPILLARY: 129 mg/dL — AB (ref 70–99)
GLUCOSE-CAPILLARY: 280 mg/dL — AB (ref 70–99)
Glucose-Capillary: 131 mg/dL — ABNORMAL HIGH (ref 70–99)
Glucose-Capillary: 151 mg/dL — ABNORMAL HIGH (ref 70–99)

## 2013-06-09 LAB — TROPONIN I: Troponin I: <0.3 ng/mL (ref <0.30)

## 2013-06-09 LAB — TSH: TSH: 1.108 u[IU]/mL (ref 0.350–4.500)

## 2013-06-09 MED ORDER — INSULIN ASPART 100 UNIT/ML ~~LOC~~ SOLN
0.0000 [IU] | Freq: Every day | SUBCUTANEOUS | Status: DC
  Administered 2013-06-09: 3 [IU] via SUBCUTANEOUS

## 2013-06-09 MED ORDER — INSULIN DETEMIR 100 UNIT/ML ~~LOC~~ SOLN
7.0000 [IU] | Freq: Two times a day (BID) | SUBCUTANEOUS | Status: DC
  Administered 2013-06-09 – 2013-06-17 (×17): 7 [IU] via SUBCUTANEOUS
  Filled 2013-06-09 (×18): qty 0.07

## 2013-06-09 MED ORDER — INSULIN ASPART 100 UNIT/ML ~~LOC~~ SOLN
0.0000 [IU] | Freq: Three times a day (TID) | SUBCUTANEOUS | Status: DC
  Administered 2013-06-11 – 2013-06-12 (×3): 1 [IU] via SUBCUTANEOUS
  Administered 2013-06-15: 2 [IU] via SUBCUTANEOUS
  Administered 2013-06-16 – 2013-06-17 (×3): 1 [IU] via SUBCUTANEOUS

## 2013-06-09 MED ORDER — WARFARIN SODIUM 2 MG PO TABS
2.0000 mg | ORAL_TABLET | Freq: Once | ORAL | Status: AC
  Administered 2013-06-09: 2 mg via ORAL
  Filled 2013-06-09: qty 1

## 2013-06-09 NOTE — Progress Notes (Signed)
Clinical Social Work Department BRIEF PSYCHOSOCIAL ASSESSMENT 06/09/2013  Patient:  Ariana [name],Ariana [name]     Account Number:  [id number]     Admit date:  06/08/2013  Clinical Social Worker:  Varney BilesANDERSON,Kym Scannell, LCSWA  Date/Time:  06/09/2013 11:04 AM  Referred by:  Physician  Date Referred:  06/09/2013 Referred for  SNF Placement   Other Referral:   Interview type:  Family Other interview type:    PSYCHOSOCIAL DATA Living Status:  FACILITY Admitted from facility:  [facility] [place] Level of care:  Skilled Nursing Facility Primary support name:  Ariana [name] [phone]) Primary support relationship to patient:  CHILD, ADULT Degree of support available:   Good--pt living at [facility] [place] for 3 weeks, and son and daughter-in-law active in care.    CURRENT CONCERNS Current Concerns  Post-Acute Placement   Other Concerns:    SOCIAL WORK ASSESSMENT / PLAN CSW called pt's son, as pt is disoriented x3, and left a voicemail explaining CSW role in discharge and requesting that they verify pt is from [facility] and they would like her to return. CSW called [facility] and left a voicemail explaining pt is on CSW's unit and CSW wanted to be sure she can return when ready for discharge. CSW received a return call from pt's daughter-in-law, Ariana [name]. [name] explained that they have been happy with [facility] and they would like her to return. CSW explained role in discharge, and daughter-in-law thanked CSW for assistance. [name] states pt has been at [facility] for about 3 weeks and they are happy with the care she receives but they have been a little frustrated because communication with the MDs at [facility] has been infrequent since pt's hospital admission. However, Ariana [name] states they have called the facility and are addressing this and Ariana [name] is coming to the hospital today to have her questions about pt's disposition answered.   Assessment/plan status:  Psychosocial Support/Ongoing  Assessment of Needs Other assessment/ plan:   Information/referral to community resources:   SNF ([facility] [place]).    PATIENT'S/FAMILY'S RESPONSE TO PLAN OF CARE: Good--pt's daughter-in-law understanding of CSW role and friendly on the phone. Family is happy with [facility] and are eager for pt to return.       Ariana [name], MSW, [facility] Clinical Social Worker [phone]

## 2013-06-09 NOTE — Evaluation (Signed)
Physical Therapy Evaluation Patient Details Name: Ariana [name] MRN: [id number] DOB: 18-Sep-1940 Today's Date: 06/09/2013 Time: 0732-0750 PT Time Calculation (min): 18 min  PT Assessment / Plan / Recommendation History of Present Illness  Ariana [name]  is a 73 y.o. female, with a Hx of CHF, COPD, CAD, DM, Dementia, and recurrent transudative pleural effusion. She presents to the ED in acute hypoxic respiratory failure with SOB and dyspnea.  She is currently requiring 4L of oxygen, she normally requires 2L at SNF. Pt with h/o CVA right hemiparesis and expressive aphasia from [facility] with Right pleural effusion s/p thoracentesis 1/12  Clinical Impression  Pt very pleasant and [facility] but no family present to confirm. Pt with bil ankle plantarflexion contractures and would benefit from left AFO and right PRAFO. Will follow to maximize mobility, function and independence to decrease burden of care. Recommend OOB to chair daily with staff.      PT Assessment  Patient needs continued PT services    Follow Up Recommendations  SNF    Does the patient have the potential to tolerate intense rehabilitation      Barriers to Discharge        Equipment Recommendations  None recommended by PT    Recommendations for Other Services     Frequency Min 2X/week    Precautions / Restrictions Precautions Precautions: Fall Precaution Comments: bil inverted ankles   Pertinent Vitals/Pain sats 94-100% on 2L HR 78-87 BP 127/54 EOB      Mobility  Bed Mobility Overal bed mobility: Modified Independent General bed mobility comments: use of rail pt able to roll and sit with bed flat Transfers Overall transfer level: Needs assistance Transfers: Sit to/from Stand;Squat Pivot Transfers Sit to Stand: Min assist Squat pivot transfers: Mod assist General transfer comment: assist for balance, safety and setup    Exercises     PT Diagnosis: Hemiplegia dominant  side  PT Problem List: Decreased strength;Decreased range of motion;Decreased mobility PT Treatment Interventions: Functional mobility training;Therapeutic activities;Balance training;Therapeutic exercise;Patient/family education     PT Goals(Current goals can be found in the care plan section) Acute Rehab PT Goals Patient Stated Goal: pt agreeable to increase mobility PT Goal Formulation: With patient Time For Goal Achievement: 06/23/13 Potential to Achieve Goals: Fair  Visit Information  Last PT Received On: 06/09/13 Assistance Needed: +1 History of Present Illness: Ariana [name]  is a 73 y.o. female, with a Hx of CHF, COPD, CAD, DM, Dementia, and recurrent transudative pleural effusion. She presents to the ED in acute hypoxic respiratory failure with SOB and dyspnea.  She is currently requiring 4L of oxygen, she normally requires 2L at SNF. Pt with h/o CVA right hemiparesis and expressive aphasia from [facility] with Right pleural effusion s/p thoracentesis 1/12       Prior Functioning  Home Living Family/patient expects to be discharged to:: Skilled nursing facility Prior Function Level of Independence: Needs assistance Gait / Transfers Assistance Needed: Nonambulatory but transfers from bed to chair with 1 person assist. ADL's / Homemaking Assistance Needed: assist for all ADLs Communication Communication: Expressive difficulties    Cognition  Cognition Arousal/Alertness: Awake/alert Behavior During Therapy: WFL for tasks assessed/performed Overall Cognitive Status: Difficult to assess Difficult to assess due to: Impaired communication    Extremity/Trunk Assessment Upper Extremity Assessment Upper Extremity Assessment: Generalized weakness;RUE deficits/detail;LUE deficits/detail RUE Deficits / Details: 4/5 shoulder flexion and abduction, 3/5 elbow flexion/extension LUE Deficits / Details: 4/5 shoulder flexion and  abduction, 3+/5 elbow flexion/extension Lower Extremity  Assessment Lower Extremity Assessment: RLE deficits/detail;LLE deficits/detail RLE Deficits / Details: ankle 0/5, knee 3-/5, hip 3/5, ankle plantarflexion contracture LLE Deficits / Details: 4/5 hip flexion, 3+/5 knee flexion and extension, ankle 1/5 able to achieve neutral dorsiflexion passively Cervical / Trunk Assessment Cervical / Trunk Assessment: Normal   Balance    End of Session PT - End of Session Equipment Utilized During Treatment: Gait belt Activity Tolerance: Patient tolerated treatment well Patient left: in chair;with call bell/phone within reach Nurse Communication: Mobility status;Precautions  GP     Delorse Lek 06/09/2013, 7:58 AM Delaney Meigs, PT [phone]

## 2013-06-09 NOTE — Discharge Instructions (Signed)

## 2013-06-09 NOTE — ED Provider Notes (Signed)
I saw and evaluated the patient, reviewed the resident's note and I agree with the findings and plan.  EKG Interpretation    Date/Time:  Monday June 08 2013 08:30:17 EST Ventricular Rate:  83 PR Interval:    QRS Duration: 119 QT Interval:  410 QTC Calculation: 482 R Axis:   -61 Text Interpretation:  Atrial fibrillation Ventricular premature complex Incomplete RBBB and LAFB Probable left ventricular hypertrophy Probable posterior infarct, acute Similar to prior, no acute MI Confirmed by Gwendolyn GrantWALDEN  MD, Bence Trapp ([id number]) on 06/08/2013 8:35:56 AM            CRITICAL CARE Performed by: Dagmar HaitWALDEN, WILLIAM Bryer Cozzolino   Total critical care time: 30 minutes  Critical care time was exclusive of separately billable procedures and treating other patients.  Critical care was necessary to treat or prevent imminent or life-threatening deterioration.  Critical care was time spent personally by me on the following activities: development of treatment plan with patient and/or surrogate as well as nursing, discussions with consultants, evaluation of patient's response to treatment, examination of patient, obtaining history from patient or surrogate, ordering and performing treatments and interventions, ordering and review of laboratory studies, ordering and review of radiographic studies, pulse oximetry and re-evaluation of patient's condition.   Patient evaluated by me. She diffusely in the hospital and had a right-sided thoracentesis to remove fluid. She improved after this. She came back in with acute shortness of breath. Given nebulizers by EMS. She is satting on 3 L after nebulizers. She received steroids and antibiotics for concern for pneumonia. She was admitted to the medicine service and and got a thoracentesis. She arrived back in ED and did have an episode of hypotension with a blood pressure 78/48. She was given fluids which improved her blood pressure. Admitted to medicine to  Dagmar HaitWilliam Casanova Schurman,  MD 06/09/13 [phone]

## 2013-06-09 NOTE — Care Management Note (Signed)
    Page 1 of 1   06/17/2013     5:08:13 PM   CARE MANAGEMENT NOTE 06/17/2013  Patient:  Ariana [name],Ariana [name]   Account Number:  [id number]  Date Initiated:  06/09/2013  Documentation initiated by:  Junius CreamerWELL,DEBBIE  Subjective/Objective Assessment:   adm w chf     Action/Plan:   from nsg facility   Anticipated DC Date:  06/17/2013   Anticipated DC Plan:  SKILLED NURSING FACILITY  In-house referral  Clinical Social Worker      DC Planning Services  CM consult      Choice offered to / List presented to:             Status of service:  Completed, signed off Medicare Important Message given?   (If response is "NO", the following Medicare IM given date fields will be blank) Date Medicare IM given:   Date Additional Medicare IM given:    Discharge Disposition:  SKILLED NURSING FACILITY  Per UR Regulation:  Reviewed for med. necessity/level of care/duration of stay  If discussed at Long Length of Stay Meetings, dates discussed:   06/16/2013    Comments:  06/17/13 Nahome Bublitz,RN,BSN [phone] PT DISCHARGED TO [facility] TODAY, PER CSW ARRANGEMENTS.  06/11/13 Aizen Duval,RN,BSN [phone] PT ADM FROM [facility] SKILLED NURSING FACILITY.  CSW CONSULTED TO FACILITATE RETURN TO SNF WHEN MEDICALLY STABLE FOR DC.  WILL FOLLOW PROGRESS.

## 2013-06-09 NOTE — Progress Notes (Signed)
ANTICOAGULATION CONSULT NOTE   Pharmacy Consult for coumadin Indication: atrial fibrillation  Allergies  Allergen Reactions  . Baclofen   . Clindamycin/Lincomycin     Patient Measurements: Height: 5\' 5"  (165.1 cm) Weight: 170 lb 13.7 oz (77.5 kg) IBW/kg (Calculated) : 57  Dosing Weight: 77.3 kg  Vital Signs: Temp: 98.8 F (37.1 C) (01/13 0738) Temp src: Oral (01/13 0738) BP: 127/54 mmHg (01/13 0738) Pulse Rate: 84 (01/13 0752)  Labs:  Recent Labs  06/08/13 0842 06/08/13 0857  06/08/13 1900 06/09/13 0231 06/09/13 0300 06/09/13 0614  HGB 10.1* 12.6  --  9.7*  --  9.0*  --   HCT 32.4* 37.0  --  31.5*  --  29.1*  --   PLT 216  --   --  183  --  174  --   LABPROT 20.2*  --   --   --   --  21.5*  --   INR 1.78*  --   --   --   --  1.93*  --   CREATININE  --  1.90*  --  1.93*  --  1.99*  --   TROPONINI  --   --   < > <0.30 <0.30  --  <0.30  < > = values in this interval not displayed.  Estimated Creatinine Clearance: 26.7 ml/min (by C-G formula based on Cr of 1.99).  Medications:  See med rec  Assessment: 73 yo lady to continue coumadin for afib.  She was on coumadin 1.5 mg daily at home.  INR on admit 1.78.  INR now trending up after extra dose given last night and is just below goal at 1.93. No bleeding complications noted. CBC trending down but could be dilutional given fluid boluses. S/p right thoracentesis yesterday with 1.2L removed. Will repeat extra dose tonight to ensure patient gets therapeutic.   Noted patient also on sq heparin - recommend d/c once INR>2 which should be tomorrow.  Goal of Therapy:  INR 2-3 Monitor platelets by anticoagulation protocol: Yes   Plan:  Coumadin 2 mg po today.  Check daily protimes D/c sq heparin when INR >2  Sheppard CoilFrank Wilson PharmD., BCPS Clinical Pharmacist Pager [phone] 06/09/2013 10:46 AM

## 2013-06-09 NOTE — Progress Notes (Signed)
Pt was admitted for [facility] and will be returning there.  Will defer this OT eval back to SNF as they are familiar with this pt.  Acute OT to sign off.   Ariana [name], [place] [phone]

## 2013-06-09 NOTE — Evaluation (Signed)
Clinical/Bedside Swallow Evaluation Patient Details  Name: Ariana [name] MRN: [id number] Date of Birth: 12/20/1940  Today's Date: 06/09/2013 Time: 1530-1605 SLP Time Calculation (min): 35 min  Past Medical History:  Past Medical History  Diagnosis Date  . Pneumonia   . Pleural effusion   . CHF (congestive heart failure)   . Paroxysmal atrial fibrillation   . COPD (chronic obstructive pulmonary disease)   . Stroke   . Dysarthria due to cerebrovascular accident   . 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 Coronary artery disease   . Diabetes mellitus without complication   . GERD (gastroesophageal reflux disease)   . Dementia    Past Surgical History:  Past Surgical History  Procedure Laterality Date  . Thoracostomy    . Thoracentesis     HPI:  73 yo female adm to [facility] with shortness of breath, diagnosed with recurrent right pleural effusion s/p thoracentesis.  PMH + for CVA with Rt HP, GERD, dysarthria.  Pt CXR 1/12 showed progressive mild CHF.  Pt denies dysphagia but admits to shortness of breath with po intake decreasing her intake.     Assessment / Plan / Recommendation Clinical Impression  Pt's largest aspiration risk currently is due to her respiratory status.  She admits to becoming dyspenic with nearly any exertion.  Observed pt eating small amounts of graham cracker, applesauce and water.  No clinical indications of aspiration but mild delayed swallow and decreased mastication ability resulting in mild oral cracker residuals pt able to clear independently with applesauce.   SLP educated pt to aspiration precautions to mitigate risk - using teach back for reinforcement.  Rec continue soft diet for energy conservation with thin liquids.    IF MD is worried re: overt silent aspiration MBS may be completed.  At one point, pt verbalized pain in left side - ? pointing to esophagus- question if this may be consistent with esophageal spasms.   Pt's language deficit from her CVA impacts her understanding and expression.  Advised pt to try warm or room temperature liquids/intake to see if decreases pain.    SLP to follow briefly for dietary tolerance/family and pt education as well as MBS if MD deems indicated.  Thanks for this consult.       Aspiration Risk  Mild (chronic due to respiratory status)    Diet Recommendation Dysphagia 3 (Mechanical Soft);Thin liquid   Liquid Administration via: Cup;No straw Medication Administration:  (as tolerated) Supervision: Intermittent supervision to cue for compensatory strategies (set up assist) Compensations: Slow rate;Small sips/bites;Check for pocketing Postural Changes and/or Swallow Maneuvers: Seated upright 90 degrees;Upright 30-60 min after meal (frequent rest breaks if dyspenic or frequent coughing with intake)    Other  Recommendations Oral Care Recommendations: Oral care BID   Follow Up Recommendations  Skilled Nursing facility    Frequency and Duration min 2x/week  2 weeks   Pertinent Vitals/Pain Afebrile, decreased       Swallow Study Prior Functional Status   pt denies dysphagia, but admits to shortness of breath with any exertion prior to admission    General Date of Onset: 06/09/13 HPI: 73 yo female adm to [facility] with shortness of breath, diagnosed with recurrent right pleural effusion s/p thoracentesis.  PMH + for CVA with Rt HP, GERD, dysarthria.  Pt CXR 1/12 showed progressive mild CHF.  Pt denies dysphagia but admits to shortness of breath with po intake decreasing her intake.   Type of Study: Bedside swallow  evaluation Previous Swallow Assessment: none Diet Prior to this Study: Dysphagia 3 (soft);Thin liquids Temperature Spikes Noted: No Respiratory Status: Nasal cannula History of Recent Intubation: No Behavior/Cognition: Alert;Cooperative Oral Cavity - Dentition: Dentures, top (missing lower dentures x2 years per pt) Self-Feeding Abilities: Needs set  up Patient Positioning: Upright in bed Baseline Vocal Quality: Clear Volitional Cough: Other (Comment) (did not test due to pt s/p thoracentesis) Volitional Swallow: Able to elicit    Oral/Motor/Sensory Function Overall Oral Motor/Sensory Function: Appears within functional limits for tasks assessed (? mild right facial decreased movement, ? oral motor planning issues ) Velum: Within Functional Limits Mandible: Within Functional Limits   Ice Chips Ice chips: Not tested   Thin Liquid Thin Liquid: Within functional limits Presentation: Self Fed;Cup;Straw Other Comments: inhalation noted after swallow via straw, ? minimal discoordination via straw    Nectar Thick Nectar Thick Liquid: Not tested   Honey Thick Honey Thick Liquid: Not tested   Puree Puree: Within functional limits Presentation: Self Fed;Spoon   Solid   GO    Solid: Impaired Presentation: Self Fed;Spoon Oral Phase Impairments: Reduced lingual movement/coordination Oral Phase Functional Implications: Oral residue Other Comments: mild oral stasis was cleared with pt consuming applesauce      Donavan Burnetamara Cassundra Mckeever, MS [facility] SLP [phone]

## 2013-06-09 NOTE — Progress Notes (Signed)
Patient ID: Waymon AmatoShirley Nguyen, female   DOB: Aug 04, 1940, 73 y.o.   MRN: [id number]    Subjective:  Doing much better Bright and non lethargic less dyspnic   Objective:  Filed Vitals:   06/09/13 0700 06/09/13 0738 06/09/13 0752 06/09/13 1025  BP:  127/54    Pulse: 83 80 84   Temp:  98.8 F (37.1 C)    TempSrc:  Oral    Resp: 19 22    Height:      Weight:      SpO2: 100% 94% 100% 100%    Intake/Output from previous day:  Intake/Output Summary (Last 24 hours) at 06/09/13 1100 Last data filed at 06/09/13 0400  Gross per 24 hour  Intake    350 ml  Output   1900 ml  Net  -1550 ml    Physical Exam: Affect appropriate Chronically ill white female  HEENT: normal Neck supple with no adenopathy JVP normal no bruits no thyromegaly Lungs improved basilar breath sounds espicially on right mild expitory wheezing and good diaphragmatic motion Heart:  S1/S2 no murmur, no rub, gallop or click PMI normal Abdomen: benighn, BS positve, no tenderness, no AAA no bruit.  No HSM or HJR Distal pulses intact with no bruits Trace  edema Neuro right hemiparesis  Skin warm and dry No muscular weakness   Lab Results: Basic Metabolic Panel:  Recent Labs  40/98/1100/05/11 0857 06/08/13 1900 06/09/13 0300  NA 140  --  139  K 4.7  --  4.6  CL 107  --  105  CO2  --   --  21  GLUCOSE 132*  --  134*  BUN 30*  --  32*  CREATININE 1.90* 1.93* 1.99*  CALCIUM  --   --  8.5   CBC:  Recent Labs  06/08/13 0842  06/08/13 1900 06/09/13 0300  WBC 7.8  --  4.9 6.6  NEUTROABS 5.8  --   --   --   HGB 10.1*  < > 9.7* 9.0*  HCT 32.4*  < > 31.5* 29.1*  MCV 81.8  --  83.1 81.5  PLT 216  --  183 174  < > = values in this interval not displayed. Cardiac Enzymes:  Recent Labs  06/08/13 1900 06/09/13 0231 06/09/13 0614  TROPONINI <0.30 <0.30 <0.30   Thyroid Function Tests:  Recent Labs  06/08/13 1900  TSH 1.108    Imaging: Cardiomegaly. Right upper extremity PICC is present with the tip in   the mid SVC. Aortic arch atherosclerosis. Bilateral basilar airspace  disease and atelectasis is present. This is favored to represent  pulmonary edema. Small bilateral pleural effusions are present.  Thoracolumbar fixation hardware incidentally noted. Monitoring leads  project over the chest.  Compared to the prior, basilar aeration has worsened slightly.  IMPRESSION:  Constellation of findings compatible with progressive mild CHF.   Cardiac Studies:  ECG:  afib RBBB PVC no acute ischemic changes    Telemetry:   afib PVC;s   Echo: normal EF severe LVH   Medications:   . albuterol  2.5 mg Nebulization TID  . aspirin  81 mg Oral Daily  . atorvastatin  20 mg Oral QHS  . Chlorhexidine Gluconate Cloth  6 each Topical Q0600  . docusate sodium  100 mg Oral BID  . furosemide  40 mg Intravenous BID  . heparin  5,000 Units Subcutaneous Q8H  . insulin aspart  0-6 Units Subcutaneous TID AC  . ipratropium  0.5 mg Nebulization TID  .  metoprolol tartrate  12.5 mg Oral BID  . mupirocin ointment  1 application Nasal BID  . senna-docusate  1 tablet Oral QHS  . sodium chloride  10-40 mL Intracatheter Q12H  . sodium chloride  3 mL Intravenous Q12H  . venlafaxine XR  75 mg Oral Daily  . warfarin  2 mg Oral ONCE-1800  . Warfarin - Pharmacist Dosing Inpatient   Does not apply q1800       Assessment/Plan:  Diastolic Dysfunction:  LVH  CRF and loss of atrial kick with afib  Continue bid iv lasix   Pleural effuson:  Check 24 hr urine for nephrotic syndrome  ?  Sig protein in UA  TSH normal  She has had 4 throracentesis on right side since 04/18/13  Guidelines would suggest that pleurX catheter or pleurodesis  Is next step  Would further discuss with CVTS CVA:  Continue coumaind baseline right hemiparesis   Charlton Haws 06/09/2013, 11:00 AM

## 2013-06-09 NOTE — Progress Notes (Addendum)
[facility] Progress Note    Ariana [name] ZOX:[id number] DOB: 02-May-1941 DOA: 06/08/2013 PCP: Garlan Fillers, MD  Brief narrative: 73 year old female patient with multiple medical problems including CHF and recurrent transudative pleural effusion. She presented to the ED with complaints of shortness of breath and was found to be in acute hypoxemic respiratory failure. She is on chronic oxygen at skilled nursing facility baseline 2 L but was requiring 4 L per minute in the emergency department she has [facility] [place] [facility] effusions related to CHF. A right pleural catheter drain was placed by IR November 24 but pulled out at the bedside November 30 because of patient complaints of pain. This was while she was at [facility]. Patient had abrupt onset of symptoms beginning Sunday, January 11 and had progressive deterioration in dyspnea until she was brought to the emergency department. Her chest x-ray revealed bibasilar edema with small bilateral pleural effusions. On exam she had diminished breath sounds bilaterally with a positive hepatojugular reflux. Her BNP was 21,847. Clinical exam was not consistent with infection or pneumonia process  Assessment/Plan: Active Problems:  Acute and chronic respiratory failure with hypoxia:   A) Recurrent pleural effusion on right   B) Acute diastolic heart failure -Cards following -post thoracentesis 1.2 L c/w transudative -suggest CVTS eval for PleurX catheter- consulted -agree may have silent aspiration so consult SLP -cont IV Lasix per Cards recs -no leukocytosis so doubt PNA and no other potential infectious sources so consider stop Levaquin    Hypotension -transient post thora and due to expected volume shifting-current albumin ?? 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 Still with some malaise and shortness of breath but apparently reports better than yesterday.  Objective: Blood pressure 107/57, pulse 72, temperature 98.5 F (36.9 C), temperature source Oral, resp. rate 23, height 5\' 5"  (1.651 m), weight 170 lb 13.7 oz (77.5 kg), SpO2 98.00%.  Intake/Output Summary (Last 24 hours) at 06/09/13 1154 Last data filed at 06/09/13 0400  Gross per 24 hour  Intake    350 ml  Output   1900 ml  Net  -1550 ml     Exam: General: No acute respiratory distress Lungs: Coarse to auscultation bilaterally without wheezes or crackles, still diminished in bases,  2 L Cardiovascular: Irregular rate and rhythm without murmur gallop or rub normal S1 and S2, no peripheral edema or JVD Abdomen: Nontender, nondistended, soft, bowel sounds positive, no rebound, no ascites, no appreciable mass Musculoskeletal: No significant cyanosis, clubbing of bilateral lower extremities Neurological: Alert and oriented x name and place, moves all extremities x 4 without focal neurological deficits, CN 2-12 intact  Scheduled Meds:  Scheduled Meds: . albuterol  2.5 mg Nebulization TID  . aspirin  81 mg Oral Daily  . atorvastatin  20 mg Oral QHS  . Chlorhexidine Gluconate Cloth  6  each Topical Q0600  . docusate sodium  100 mg Oral BID  . furosemide  40 mg Intravenous BID  . heparin  5,000 Units Subcutaneous Q8H  . insulin aspart  0-6 Units Subcutaneous TID AC  . ipratropium  0.5 mg Nebulization TID  . metoprolol tartrate  12.5 mg Oral BID  . mupirocin ointment  1 application Nasal BID  . senna-docusate  1 tablet Oral QHS  . sodium chloride  10-40 mL Intracatheter Q12H  . sodium chloride  3 mL Intravenous Q12H  . venlafaxine XR  75 mg Oral Daily  . warfarin  2 mg Oral ONCE-1800  . Warfarin - Pharmacist Dosing Inpatient   Does not apply q1800   Continuous Infusions:   **Reviewed in detail by the Attending Physician  Data Reviewed: Basic Metabolic Panel:  Recent Labs Lab 06/03/13 0505 06/08/13 0857 06/08/13 1900 06/09/13 0300  NA 139 140  --  139  K 4.3 4.7  --  4.6  CL 107 107  --  105  CO2 19  --   --  21  GLUCOSE 121* 132*  --  134*  BUN 33* 30*  --  32*  CREATININE 1.89* 1.90* 1.93* 1.99*  CALCIUM 8.7  --   --  8.5   Liver Function Tests: No results found for this basename: AST, ALT, ALKPHOS, BILITOT, PROT, ALBUMIN,  in the last 168 hours No results found for this basename: LIPASE, AMYLASE,  in the last 168 hours No results found for this basename: AMMONIA,  in the last 168 hours CBC:  Recent Labs Lab 06/03/13 0505 06/08/13 0842 06/08/13 0857 06/08/13 1900 06/09/13 0300  WBC 6.7 7.8  --  4.9 6.6  NEUTROABS  --  5.8  --   --   --   HGB 9.7* 10.1* 12.6 9.7* 9.0*  HCT 32.3* 32.4* 37.0 31.5* 29.1*  MCV 83.9 81.8  --  83.1 81.5  PLT 210 216  --  183 174   Cardiac Enzymes:  Recent Labs Lab 06/08/13 1035 06/08/13 1900 06/09/13 0231 06/09/13 0614  TROPONINI <0.30 <0.30 <0.30 <0.30   BNP (last 3 results)  Recent Labs  05/06/13 0636 05/08/13 1054 06/08/13 0842  PROBNP 11763.0* 17420.0* 21847.0*   CBG:  Recent Labs Lab 06/02/13 1221 06/02/13 1727 06/02/13 2238 06/03/13 0815 06/03/13 1244  GLUCAP 129* 96 133* 108* 130*     Recent Results (from the past 240 hour(s))  MRSA PCR SCREENING     Status: Abnormal   Collection Time    05/30/13  8:29 PM      Result Value Range Status   MRSA by PCR POSITIVE (*) NEGATIVE Final   Comment:            The GeneXpert MRSA Assay (FDA     approved for NASAL specimens     only), is one component of a     comprehensive MRSA colonization     surveillance program. It is not     intended to diagnose MRSA     infection nor to guide or     monitor treatment for     MRSA infections.     RESULT CALLED TO, READ BACK BY AND VERIFIED WITH:     PETTIFORD,A RN [phone] AT 2148 SKEEN,P  CULTURE, BLOOD (ROUTINE X 2)     Status:  None   Collection Time    05/31/13  1:15 AM      Result Value Range Status   Specimen Description BLOOD RIGHT ARM   Final   Special Requests BOTTLES DRAWN AEROBIC ONLY 4CC   Final   Culture  Setup Time     Final   Value: 05/31/2013 13:48     Performed at [facility]   Culture     Final   Value: NO GROWTH 5 DAYS     Performed at [facility]   Report Status 06/06/2013 FINAL   Final  CULTURE, BLOOD (ROUTINE X 2)     Status: None   Collection Time    05/31/13  1:24 AM      Result Value Range Status   Specimen Description BLOOD RIGHT HAND   Final   Special Requests BOTTLES DRAWN AEROBIC ONLY 6CC   Final   Culture  Setup Time     Final   Value: 05/31/2013 13:48     Performed at [facility]   Culture     Final   Value: NO GROWTH 5 DAYS     Performed at [facility]   Report Status 06/06/2013 FINAL   Final  BODY FLUID CULTURE     Status: None   Collection Time    05/31/13  3:34 AM      Result Value Range Status   Specimen Description FLUID PLEURAL   Final   Special Requests NONE   Final   Gram Stain     Final   Value: CYTOSPIN SLIDE WBC PRESENT,BOTH PMN AND MONONUCLEAR     NO ORGANISMS SEEN     Performed at [facility]   Culture     Final   Value: NO GROWTH 3 DAYS     Performed at [facility]    Report Status 06/03/2013 FINAL   Final  CULTURE, BLOOD (ROUTINE X 2)     Status: None   Collection Time    06/08/13 10:35 AM      Result Value Range Status   Specimen Description BLOOD LEFT ANTECUBITAL   Final   Special Requests BOTTLES DRAWN AEROBIC AND ANAEROBIC 10CC   Final   Culture  Setup Time     Final   Value: 06/08/2013 14:10     Performed at [facility]   Culture     Final   Value:        BLOOD CULTURE RECEIVED NO GROWTH TO DATE CULTURE WILL BE HELD FOR 5 DAYS BEFORE ISSUING A FINAL NEGATIVE REPORT     Performed at [facility]   Report Status PENDING   Incomplete  CULTURE, BLOOD (ROUTINE X 2)     Status: None   Collection Time    06/08/13 10:45 AM      Result Value Range Status   Specimen Description BLOOD RIGHT HAND   Final   Special Requests BOTTLES DRAWN AEROBIC ONLY 5CC   Final   Culture  Setup Time     Final   Value: 06/08/2013 14:10     Performed at [facility]   Culture     Final   Value:        BLOOD CULTURE RECEIVED NO GROWTH TO DATE CULTURE WILL BE HELD FOR 5 DAYS BEFORE ISSUING A FINAL NEGATIVE REPORT     Performed at [facility]   Report Status PENDING   Incomplete  MRSA PCR SCREENING  Status: Abnormal   Collection Time    06/08/13  5:47 PM      Result Value Range Status   MRSA by PCR POSITIVE (*) NEGATIVE Final   Comment:            The GeneXpert MRSA Assay (FDA     approved for NASAL specimens     only), is one component of a     comprehensive MRSA colonization     surveillance program. It is not     intended to diagnose MRSA     infection nor to guide or     monitor treatment for     MRSA infections.     RESULT CALLED TO, READ BACK BY AND VERIFIED WITHIzetta Dakin RN 2219 06/08/13 A BROWNING     Studies:  Recent x-ray studies have been reviewed in detail by the Attending Physician  Time spent :   Ariana [name], Ariana [name] [facility] Office  [phone] Pager [phone]  **If unable to  reach the above provider after paging please contact the Flow Manager @ [phone]  On-Call/Text Page:      [email or web address]      password [email or web address]  If 7PM-7AM, please contact night-coverage [email or web address] Password [facility] 06/09/2013, 11:54 AM   LOS: 1 day   I have examined the patient, reviewed the chart and modified the above note which I agree with.   RIZWAN,SAIMA,MD [phone] 06/09/2013, 4:53 PM

## 2013-06-10 ENCOUNTER — Inpatient Hospital Stay (HOSPITAL_COMMUNITY): Payer: Medicare Other

## 2013-06-10 DIAGNOSIS — J90 Pleural effusion, not elsewhere classified: Secondary | ICD-10-CM

## 2013-06-10 DIAGNOSIS — I69922 Dysarthria following unspecified cerebrovascular disease: Secondary | ICD-10-CM

## 2013-06-10 DIAGNOSIS — I69959 Hemiplegia and hemiparesis following unspecified cerebrovascular disease affecting unspecified side: Secondary | ICD-10-CM

## 2013-06-10 LAB — BASIC METABOLIC PANEL
BUN: 33 mg/dL — ABNORMAL HIGH (ref 6–23)
CHLORIDE: 106 meq/L (ref 96–112)
CO2: 23 meq/L (ref 19–32)
Calcium: 8.5 mg/dL (ref 8.4–10.5)
Creatinine, Ser: 2.13 mg/dL — ABNORMAL HIGH (ref 0.50–1.10)
GFR calc non Af Amer: 22 mL/min — ABNORMAL LOW (ref >90)
GFR, EST AFRICAN AMERICAN: 26 mL/min — AB (ref >90)
Glucose, Bld: 75 mg/dL (ref 70–99)
POTASSIUM: 4 meq/L (ref 3.7–5.3)
Sodium: 141 mEq/L (ref 137–147)

## 2013-06-10 LAB — PROTIME-INR
INR: 2.57 — AB (ref 0.00–1.49)
Prothrombin Time: 26.7 seconds — ABNORMAL HIGH (ref 11.6–15.2)

## 2013-06-10 LAB — GLUCOSE, CAPILLARY
GLUCOSE-CAPILLARY: 79 mg/dL (ref 70–99)
GLUCOSE-CAPILLARY: 91 mg/dL (ref 70–99)
GLUCOSE-CAPILLARY: 87 mg/dL (ref 70–99)
Glucose-Capillary: 152 mg/dL — ABNORMAL HIGH (ref 70–99)

## 2013-06-10 LAB — CBC
HCT: 28.7 % — ABNORMAL LOW (ref 36.0–46.0)
Hemoglobin: 9 g/dL — ABNORMAL LOW (ref 12.0–15.0)
MCH: 25.6 pg — ABNORMAL LOW (ref 26.0–34.0)
MCHC: 31.4 g/dL (ref 30.0–36.0)
MCV: 81.5 fL (ref 78.0–100.0)
Platelets: 210 10*3/uL (ref 150–400)
RBC: 3.52 MIL/uL — AB (ref 3.87–5.11)
RDW: 18.6 % — ABNORMAL HIGH (ref 11.5–15.5)
WBC: 8.9 10*3/uL (ref 4.0–10.5)

## 2013-06-10 MED ORDER — CLONAZEPAM 0.5 MG PO TABS
0.5000 mg | ORAL_TABLET | Freq: Every morning | ORAL | Status: DC
  Administered 2013-06-10 – 2013-06-16 (×7): 0.5 mg via ORAL
  Filled 2013-06-10 (×8): qty 1

## 2013-06-10 MED ORDER — IPRATROPIUM-ALBUTEROL 0.5-2.5 (3) MG/3ML IN SOLN
3.0000 mL | RESPIRATORY_TRACT | Status: DC
Start: 2013-06-10 — End: 2013-06-10

## 2013-06-10 MED ORDER — SENNA-DOCUSATE SODIUM 8.6-50 MG PO TABS: 1.0000 | ORAL_TABLET | Freq: Every day | ORAL | Status: DC | PRN

## 2013-06-10 MED ORDER — FAMOTIDINE 20 MG PO TABS
20.0000 mg | ORAL_TABLET | Freq: Two times a day (BID) | ORAL | Status: DC
  Administered 2013-06-10 – 2013-06-11 (×3): 20 mg via ORAL
  Filled 2013-06-10 (×4): qty 1

## 2013-06-10 MED ORDER — WARFARIN SODIUM 1 MG PO TABS: 1.5000 mg | ORAL_TABLET | Freq: Every day | ORAL | Status: DC

## 2013-06-10 MED ORDER — WARFARIN SODIUM 1 MG PO TABS
1.5000 mg | ORAL_TABLET | Freq: Once | ORAL | Status: DC
  Filled 2013-06-10: qty 1

## 2013-06-10 MED ORDER — IPRATROPIUM-ALBUTEROL 0.5-2.5 (3) MG/3ML IN SOLN
3.0000 mL | Freq: Three times a day (TID) | RESPIRATORY_TRACT | Status: DC
  Administered 2013-06-10 – 2013-06-17 (×23): 3 mL via RESPIRATORY_TRACT
  Filled 2013-06-10 (×23): qty 3

## 2013-06-10 MED ORDER — SENNA-DOCUSATE SODIUM 8.6-50 MG PO TABS
1.0000 | ORAL_TABLET | Freq: Every day | ORAL | Status: DC | PRN
  Administered 2013-06-11: 1 via ORAL
  Filled 2013-06-10: qty 1

## 2013-06-10 MED ORDER — CLONAZEPAM 1 MG PO TABS
1.0000 mg | ORAL_TABLET | Freq: Every day | ORAL | Status: DC
  Administered 2013-06-10 – 2013-06-16 (×7): 1 mg via ORAL
  Filled 2013-06-10 (×7): qty 1

## 2013-06-10 MED ORDER — TRAZODONE HCL 50 MG PO TABS
50.0000 mg | ORAL_TABLET | Freq: Every day | ORAL | Status: DC
  Administered 2013-06-10 – 2013-06-16 (×7): 50 mg via ORAL
  Filled 2013-06-10 (×8): qty 1

## 2013-06-10 NOTE — Progress Notes (Signed)
Speech Language Pathology Treatment: Dysphagia  Patient Details Name: Ariana AmatoShirley Mcmullan MRN: [id number] DOB: 1941/02/16 Today's Date: 06/10/2013 Time: 1100-1111 SLP Time Calculation (min): 11 min  Assessment / Plan / Recommendation Clinical Impression  Pt today complaining of dyspnea and anxiety, therefore did not observe her with po intake.  Pt denies coughing with intake nor reflux symptoms.  Pt with ? Concern with globus currently as pt pointing to pharynx- communication difficult due to h/o cva/aphasia.    Note pt on Pepcid at SNF but does not appear to be on a PPI currently - advised RN.  Please note, pt also reports she in 3 liters of oxygen at SNF but here is on 2 liters.    Advised pt to continue to follow strict aspiration/reflux precautions.  If MD desires to rule out overt silent aspiration, recommend conduct MBS - however doubt occuring overtly.  Episodic aspiration likely due to pt's COPD impacting swallow/respiration coordination. Advised pt yesterday to concentrate on exhalation after swallow for maximal airway protection.     HPI HPI: 73 yo female [facility] with [facility]ness of breath, diagnosed with recurrent right pleural effusion s/p thoracentesis.  PMH + for CVA with Rt HP, GERD, dysarthria.  Pt CXR 1/12 showed progressive mild CHF.  Pt denies dysphagia but admits to shortness of breath with po intake decreasing her intake.  BSE completed yesterday with recommendation for soft/thin - SLP can not rule out silent aspiration at bedside but also ? component of esophageal issues given h/o GERD and pt not currently on a PPI.  Today pt seen to assess po tolerance.     Pertinent Vitals Afebrile, decreased  SLP Plan  Continue with current plan of care    Recommendations Diet recommendations: Dysphagia 3 (mechanical soft);Thin liquid Supervision: Intermittent supervision to cue for compensatory strategies (set up assist) Compensations: Slow rate;Small sips/bites;Check for  pocketing Postural Changes and/or Swallow Maneuvers: Seated upright 90 degrees;Upright 30-60 min after meal              Oral Care Recommendations: Oral care BID Follow up Recommendations: Skilled Nursing facility Plan: Continue with current plan of care    GO     Donavan Burnetamara Buel Molder, MS [facility] SLP [phone]

## 2013-06-10 NOTE — Progress Notes (Signed)
[facility] Progress Note    Ariana [name] WJX:[id number] DOB: 10-24-1940 DOA: 06/08/2013 PCP: Garlan Fillers, MD  Brief narrative: 73 year old female patient with multiple medical problems including CHF and recurrent transudative pleural effusion. She presented to the ED with complaints of shortness of breath and was found to be in acute hypoxemic respiratory failure. She is on chronic oxygen at skilled nursing facility baseline 2 L but was requiring 4 L per minute in the emergency department she has [place] [facility] in [place] [facility] with [facility] effusions related to CHF. A right pleural catheter drain was placed by IR November 24 but pulled out at the bedside November 30 because of patient complaints of pain. This was while she was at [facility]. Patient had abrupt onset of symptoms beginning Sunday, January 11 and had progressive deterioration in dyspnea until she was brought to the emergency department. Her chest x-ray revealed bibasilar edema with small bilateral pleural effusions. On exam she had diminished breath sounds bilaterally with a positive hepatojugular reflux. Her BNP was 21,847. Clinical exam was not consistent with infection or pneumonia process  Assessment/Plan: Active Problems:  Acute and chronic respiratory failure with hypoxia:   A) Recurrent pleural effusion on right   B) Acute diastolic heart failure -Cards following -post thoracentesis 1.2 L c/w transudative -Cards suggested CVTS eval for PleurX catheter- seen and recs to tx underlying causes first- PleurX a temporary solution esp in transudative effusion from CHF -agree may have silent aspiration so consulted SLP- rec D3 diet -cont IV Lasix per Cards recs -no leukocytosis so doubt PNA and no other potential infectious sources so consider stop Levaquin -? Nephrosis contributing (GFR low)- BUN tr up so watch!    Hypotension -transient post thora and due to expected volume  shifting-current albumin ?? 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 -possible dysphagia/silent aspiration so consult SLP    GERD (gastroesophageal reflux disease)    Dementia -cont home meds   DVT prophylaxis: Warfarin Code Status: DO NOT RESUSCITATE Family Communication: Daughter at bedside Disposition Plan/Expected LOS: Transfer to Telemetry   Consultants: Cardiology CVTS  Procedures: Right thoracentesis per interventional radiology   1//12  Antibiotics: Levaquin 1/12 >>>  HPI/Subjective: Patient alert - still endorses SOB and orthopnea- esp SOB with attempts to eat  Objective: Blood pressure 99/75, pulse 67, temperature 97.8 F (36.6 C), temperature source Oral, resp. rate 19, height 5\' 5"  (1.651 m), weight 169 lb 12.1 oz (77 kg), SpO2 98.00%.  Intake/Output Summary (Last 24 hours) at 06/10/13 1446 Last data filed at 06/10/13 1400  Gross per 24 hour  Intake    520 ml  Output   1705 ml  Net  -1185 ml     Exam: General: No acute respiratory distress Lungs: Coarse to auscultation bilaterally without wheezes or crackles, still diminished in bases,  2 L Cardiovascular: Irregular rate and rhythm without murmur gallop or rub normal S1 and S2, no peripheral edema or JVD Abdomen: Nontender, nondistended, soft, bowel sounds positive, no rebound, no ascites, no appreciable mass Musculoskeletal: No significant cyanosis, clubbing of bilateral lower extremities Neurological: Alert  and oriented x name and place, moves all extremities x 4 without focal neurological deficits, CN 2-12 intact  Scheduled Meds:  Scheduled Meds: .  aspirin  81 mg Oral Daily  . atorvastatin  20 mg Oral QHS  . Chlorhexidine Gluconate Cloth  6 each Topical Q0600  . clonazePAM  0.5 mg Oral q morning - 10a  . clonazePAM  1 mg Oral QHS  . docusate sodium  100 mg Oral BID  . famotidine  20 mg Oral BID  . furosemide  40 mg Intravenous BID  . insulin aspart  0-5 Units Subcutaneous QHS  . insulin aspart  0-9 Units Subcutaneous TID WC  . insulin detemir  7 Units Subcutaneous BID  . ipratropium-albuterol  3 mL Nebulization TID  . metoprolol tartrate  12.5 mg Oral BID  . mupirocin ointment  1 application Nasal BID  . senna-docusate  1 tablet Oral QHS  . sodium chloride  10-40 mL Intracatheter Q12H  . sodium chloride  3 mL Intravenous Q12H  . traZODone  50 mg Oral QHS  . venlafaxine XR  75 mg Oral Daily  . Warfarin - Pharmacist Dosing Inpatient   Does not apply q1800   Continuous Infusions:   **Reviewed in detail by the Attending Physician  Data Reviewed: Basic Metabolic Panel:  Recent Labs Lab 06/08/13 0857 06/08/13 1900 06/09/13 0300 06/10/13 0225  NA 140  --  139 141  K 4.7  --  4.6 4.0  CL 107  --  105 106  CO2  --   --  21 23  GLUCOSE 132*  --  134* 75  BUN 30*  --  32* 33*  CREATININE 1.90* 1.93* 1.99* 2.13*  CALCIUM  --   --  8.5 8.5   Liver Function Tests: No results found for this basename: AST, ALT, ALKPHOS, BILITOT, PROT, ALBUMIN,  in the last 168 hours No results found for this basename: LIPASE, AMYLASE,  in the last 168 hours No results found for this basename: AMMONIA,  in the last 168 hours CBC:  Recent Labs Lab 06/08/13 0842 06/08/13 0857 06/08/13 1900 06/09/13 0300 06/10/13 0225  WBC 7.8  --  4.9 6.6 8.9  NEUTROABS 5.8  --   --   --   --   HGB 10.1* 12.6 9.7* 9.0* 9.0*  HCT 32.4* 37.0 31.5* 29.1* 28.7*  MCV 81.8  --  83.1 81.5 81.5  PLT 216  --  183 174 210   Cardiac Enzymes:  Recent Labs Lab 06/08/13 1035 06/08/13 1900 06/09/13 0231 06/09/13 0614  TROPONINI <0.30 <0.30 <0.30 <0.30    BNP (last 3 results)  Recent Labs  05/06/13 0636 05/08/13 1054 06/08/13 0842  PROBNP 11763.0* 17420.0* 21847.0*   CBG:  Recent Labs Lab 06/09/13 1136 06/09/13 1657 06/09/13 2158 06/10/13 0757 06/10/13 1134  GLUCAP 126* 129* 280* 79 91    Recent Results (from the past 240 hour(s))  CULTURE, BLOOD (ROUTINE X 2)     Status: None   Collection Time    06/08/13 10:35 AM      Result Value Range Status   Specimen Description BLOOD LEFT ANTECUBITAL   Final   Special Requests BOTTLES DRAWN AEROBIC AND ANAEROBIC 10CC   Final   Culture  Setup Time     Final   Value: 06/08/2013 14:10     Performed at [facility]   Culture     Final   Value:        BLOOD CULTURE RECEIVED  NO GROWTH TO DATE CULTURE WILL BE HELD FOR 5 DAYS BEFORE ISSUING A FINAL NEGATIVE REPORT     Performed at [facility]   Report Status PENDING   Incomplete  CULTURE, BLOOD (ROUTINE X 2)     Status: None   Collection Time    06/08/13 10:45 AM      Result Value Range Status   Specimen Description BLOOD RIGHT HAND   Final   Special Requests BOTTLES DRAWN AEROBIC ONLY 5CC   Final   Culture  Setup Time     Final   Value: 06/08/2013 14:10     Performed at [facility]   Culture     Final   Value:        BLOOD CULTURE RECEIVED NO GROWTH TO DATE CULTURE WILL BE HELD FOR 5 DAYS BEFORE ISSUING A FINAL NEGATIVE REPORT     Performed at [facility]   Report Status PENDING   Incomplete  MRSA PCR SCREENING     Status: Abnormal   Collection Time    06/08/13  5:47 PM      Result Value Range Status   MRSA by PCR POSITIVE (*) NEGATIVE Final   Comment:            The GeneXpert MRSA Assay (FDA     approved for NASAL specimens     only), is one component of a     comprehensive MRSA colonization     surveillance program. It is not     intended to diagnose MRSA     infection nor to guide or     monitor treatment for     MRSA infections.     RESULT CALLED TO, READ BACK BY AND VERIFIED  WITHIzetta Dakin:     C BUNGQUE RN 2219 06/08/13 A BROWNING     Studies:  Recent x-ray studies have been reviewed in detail by the Attending Physician  Time spent :   Junious SilkAllison Ellis, ANP [facility] Office  [phone] Pager [phone]  **If unable to reach the above provider after paging please contact the Flow Manager @ [phone]  On-Call/Text Page:      [email or web address] [email or web address]      password [email or web address]  If 7PM-7AM, please contact night-coverage [email or web address] Password [email or web address] 06/10/2013, 2:46 PM   LOS: 2 days   I have personally examined this patient and reviewed the entire database. I have reviewed the above note, made any necessary editorial changes, and agree with its content.  Lonia BloodJeffrey T. Shyloh Derosa, MD [facility]

## 2013-06-10 NOTE — Progress Notes (Signed)
Transferred to 2 west room 13 by wheelchair, stable, report given to RN, belongings with pt.

## 2013-06-10 NOTE — Progress Notes (Signed)
ANTICOAGULATION CONSULT NOTE   Pharmacy Consult for coumadin Indication: atrial fibrillation  Allergies  Allergen Reactions  . Baclofen   . Clindamycin/Lincomycin     Patient Measurements: Height: 5\' 5"  (165.1 cm) Weight: 169 lb 12.1 oz (77 kg) IBW/kg (Calculated) : 57  Dosing Weight: 77.3 kg  Vital Signs: Temp: 98 F (36.7 C) (01/14 0700) Temp src: Oral (01/14 0700) BP: 132/67 mmHg (01/14 0700) Pulse Rate: 73 (01/14 0700)  Labs:  Recent Labs  06/08/13 0842  06/08/13 1900 06/09/13 0231 06/09/13 0300 06/09/13 0614 06/10/13 0225  HGB 10.1*  < > 9.7*  --  9.0*  --  9.0*  HCT 32.4*  < > 31.5*  --  29.1*  --  28.7*  PLT 216  --  183  --  174  --  210  LABPROT 20.2*  --   --   --  21.5*  --  26.7*  INR 1.78*  --   --   --  1.93*  --  2.57*  CREATININE  --   < > 1.93*  --  1.99*  --  2.13*  TROPONINI  --   < > <0.30 <0.30  --  <0.30  --   < > = values in this interval not displayed.  Estimated Creatinine Clearance: 24.9 ml/min (by C-G formula based on Cr of 2.13).  Medications:  See med rec  Assessment: 73 yo lady to continue coumadin for afib.  She was on coumadin 1.5 mg daily at home.  INR on admit 1.78.  INR is now therapeutic at 2.57. No bleeding complications noted. H/H stable. S/p right thoracentesis on 1/12 with 1.2L removed.   Noted patient is also on sq heparin - will d/c today since INR is therapeutic.  Goal of Therapy:  INR 2-3 Monitor platelets by anticoagulation protocol: Yes   Plan:  Coumadin 1.5 mg po today.   Check daily protimes Monitor for s/s of bleeding  D/c sq heparin today  Vinnie LevelBenjamin Kweku Stankey, PharmD.  Clinical Pharmacist Pager [phone]

## 2013-06-10 NOTE — Progress Notes (Signed)
Patient ID: Ariana [name], female   DOB: [birth year], 73 y.o.   MRN: [id number]    Subjective:  More short of breath this am   Objective:  Filed Vitals:   06/10/13 0500 06/10/13 0600 06/10/13 0700 06/10/13 0922  BP:   132/67   Pulse: 73 71 73   Temp:   98 F (36.7 C)   TempSrc:   Oral   Resp: 18 20 18    Height:      Weight:      SpO2: 97% 100% 100% 94%    Intake/Output from previous day:  Intake/Output Summary (Last 24 hours) at 06/10/13 1013 Last data filed at 06/10/13 0600  Gross per 24 hour  Intake    440 ml  Output   1355 ml  Net   -915 ml    Physical Exam: Affect appropriate Chronically ill white female  HEENT: normal Neck supple with no adenopathy JVP normal no bruits no thyromegaly Lungs improved basilar breath sounds espicially on right mild expitory wheezing and good diaphragmatic motion Heart:  S1/S2 no murmur, no rub, gallop or click PMI normal Abdomen: benighn, BS positve, no tenderness, no AAA no bruit.  No HSM or HJR Distal pulses intact with no bruits Trace  edema Neuro right hemiparesis  Skin warm and dry No muscular weakness   Lab Results: Basic Metabolic Panel:  Recent Labs  40/98/11 0300 06/10/13 0225  NA 139 141  K 4.6 4.0  CL 105 106  CO2 21 23  GLUCOSE 134* 75  BUN 32* 33*  CREATININE 1.99* 2.13*  CALCIUM 8.5 8.5   CBC:  Recent Labs  06/08/13 0842  06/09/13 0300 06/10/13 0225  WBC 7.8  < > 6.6 8.9  NEUTROABS 5.8  --   --   --   HGB 10.1*  < > 9.0* 9.0*  HCT 32.4*  < > 29.1* 28.7*  MCV 81.8  < > 81.5 81.5  PLT 216  < > 174 210  < > = values in this interval not displayed. Cardiac Enzymes:  Recent Labs  06/08/13 1900 06/09/13 0231 06/09/13 0614  TROPONINI <0.30 <0.30 <0.30   Thyroid Function Tests:  Recent Labs  06/08/13 1900  TSH 1.108    Imaging: Cardiomegaly. Right upper extremity PICC is present with the tip in  the mid SVC. Aortic arch atherosclerosis. Bilateral basilar airspace  disease and  atelectasis is present. This is favored to represent  pulmonary edema. Small bilateral pleural effusions are present.  Thoracolumbar fixation hardware incidentally noted. Monitoring leads  project over the chest.  Compared to the prior, basilar aeration has worsened slightly.  IMPRESSION:  Constellation of findings compatible with progressive mild CHF.   Cardiac Studies:  ECG:  afib RBBB PVC no acute ischemic changes    Telemetry:   afib PVC;s   Echo: normal EF severe LVH   Medications:   . aspirin  81 mg Oral Daily  . atorvastatin  20 mg Oral QHS  . Chlorhexidine Gluconate Cloth  6 each Topical Q0600  . docusate sodium  100 mg Oral BID  . furosemide  40 mg Intravenous BID  . insulin aspart  0-5 Units Subcutaneous QHS  . insulin aspart  0-6 Units Subcutaneous TID AC  . insulin aspart  0-9 Units Subcutaneous TID WC  . insulin detemir  7 Units Subcutaneous BID  . ipratropium-albuterol  3 mL Nebulization TID  . metoprolol tartrate  12.5 mg Oral BID  . mupirocin ointment  1 application Nasal BID  .  senna-docusate  1 tablet Oral QHS  . sodium chloride  10-40 mL Intracatheter Q12H  . sodium chloride  3 mL Intravenous Q12H  . venlafaxine XR  75 mg Oral Daily  . warfarin  1.5 mg Oral ONCE-1800  . Warfarin - Pharmacist Dosing Inpatient   Does not apply q1800       Assessment/Plan:  Diastolic Dysfunction:  LVH  CRF and loss of atrial kick with afib  Continue bid iv lasix   Check CXR this am  Pleural effuson:  Check 24 hr urine for nephrotic syndrome  ?  Sig protein in UA  TSH normal  She has had 4 throracentesis on right side since 04/18/13  Guidelines would suggest that pleurX catheter or pleurodesis  Is next step  Would further discuss with CVTS no note from them on chart  CVA:  Continue coumaind baseline right hemiparesis  Dysphagia 3 diet see note from swallowing team Consider f/u barrium swallow    Charlton HawsPeter Kassandra Meriweather 06/10/2013, 10:13 AM

## 2013-06-10 NOTE — Consult Note (Signed)
Reason for Consult:Recurrent right pleural effusion Referring Physician: Dr. Arbie Cookey Khamis is an 73 y.o. female.  HPI: 73 yo woman with multiple medical problems including refractory CHF, protein losing nephropathy, atrial fibrillation, COPD, stroke with right hemiparesis and a recurring transudative pleural effusion. She has ben having issues with a recurring right pleural effusion for the past 2 months. At one point in November she had a pigtail drain placed by IR but it was removed after about a week due to pain and decreased drainage. She most recently was drained on 05/31/2013. The fluid was transudative. She did well until 1/12 when she developed recurrent SOB. She was admitted to [facility]. Her BNP was 21,000. She had 1.2 L of fluid drained with some symptomatic relief. The fluid was not sent for analysis, but was noted to be clear and straw colored.   Past Medical History  Diagnosis Date  . Pneumonia   . Pleural effusion   . CHF (congestive heart failure)   . Paroxysmal atrial fibrillation   . COPD (chronic obstructive pulmonary disease)   . Stroke   . Dysarthria due to cerebrovascular accident   . 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 Coronary artery disease   . Diabetes mellitus without complication   . GERD (gastroesophageal reflux disease)   . Dementia     Past Surgical History  Procedure Laterality Date  . Thoracostomy    . Thoracentesis      No family history on file.  Social History:  reports that she has never smoked. She does not have any smokeless tobacco history on file. She reports that she does not drink alcohol or use illicit drugs.  Allergies:  Allergies  Allergen Reactions  . Baclofen   . Clindamycin/Lincomycin     Medications:  Prior to Admission:  Prescriptions prior to admission  Medication Sig Dispense Refill  . acetaminophen (TYLENOL) 325 MG tablet Take 650 mg by mouth every 4 (four) hours as needed  (for pain).      Marland Kitchen albuterol (PROVENTIL) (2.5 MG/3ML) 0.083% nebulizer solution Take 2.5 mg by nebulization every 6 (six) hours as needed for wheezing or shortness of breath.      Marland Kitchen aspirin (ASPIRIN ADULT LOW STRENGTH) 81 MG chewable tablet Chew 81 mg by mouth daily.      Marland Kitchen atorvastatin (LIPITOR) 20 MG tablet Take 20 mg by mouth at bedtime.      . clonazePAM (KLONOPIN) 0.5 MG tablet Take 0.5 mg by mouth every morning.      . clonazePAM (KLONOPIN) 1 MG tablet Take 1 mg by mouth at bedtime.      . docusate sodium (COLACE) 100 MG capsule Take 100 mg by mouth 2 (two) times daily.      . famotidine (PEPCID) 20 MG tablet Take 20 mg by mouth 2 (two) times daily.      . furosemide (LASIX) 80 MG tablet Take 80 mg by mouth every morning.      . insulin aspart (NOVOLOG) 100 UNIT/ML injection Inject 0-6 Units into the skin 3 (three) times daily before meals. CBG less than 200=0 units 200-250=2 units 251-300=4 units Greater than 300=6 units      . insulin detemir (LEVEMIR) 100 UNIT/ML injection Inject 7 Units into the skin 2 (two) times daily.      Marland Kitchen ipratropium (ATROVENT) 0.02 % nebulizer solution Take 0.5 mg by nebulization every 6 (six) hours as needed for wheezing or shortness of breath.      Marland Kitchen  metoprolol tartrate (LOPRESSOR) 25 MG tablet Take 25 mg by mouth 2 (two) times daily.      . Multiple Vitamins-Minerals (MULTIVITAMINS THER. W/MINERALS) TABS tablet Take 1 tablet by mouth daily.      . potassium chloride (K-DUR,KLOR-CON) 10 MEQ tablet Take 10 mEq by mouth daily.      Marland Kitchen senna-docusate (SENOKOT-S) 8.6-50 MG per tablet Take 1 tablet by mouth at bedtime.      . sennosides-docusate sodium (SENOKOT-S) 8.6-50 MG tablet Take 1 tablet by mouth daily as needed for constipation ((in addition to daily scheduled dose)).      . traZODone (DESYREL) 50 MG tablet Take 50 mg by mouth at bedtime.      Marland Kitchen venlafaxine XR (EFFEXOR-XR) 75 MG 24 hr capsule Take 75 mg by mouth daily.      Marland Kitchen warfarin (COUMADIN) 1 MG tablet  Take 1.5 mg by mouth at bedtime.        Results for orders placed during the hospital encounter of 06/08/13 (from the past 48 hour(s))  TROPONIN I     Status: None   Collection Time    06/08/13 10:35 AM      Result Value Range   Troponin I <0.30  <0.30 ng/mL   Comment:            Due to the release kinetics of cTnI,     a negative result within the first hours     of the onset of symptoms does not rule out     myocardial infarction with certainty.     If myocardial infarction is still suspected,     repeat the test at appropriate intervals.  CULTURE, BLOOD (ROUTINE X 2)     Status: None   Collection Time    06/08/13 10:35 AM      Result Value Range   Specimen Description BLOOD LEFT ANTECUBITAL     Special Requests BOTTLES DRAWN AEROBIC AND ANAEROBIC 10CC     Culture  Setup Time       Value: 06/08/2013 14:10     Performed at [facility]   Culture       Value:        BLOOD CULTURE RECEIVED NO GROWTH TO DATE CULTURE WILL BE HELD FOR 5 DAYS BEFORE ISSUING A FINAL NEGATIVE REPORT     Performed at [facility]   Report Status PENDING    CULTURE, BLOOD (ROUTINE X 2)     Status: None   Collection Time    06/08/13 10:45 AM      Result Value Range   Specimen Description BLOOD RIGHT HAND     Special Requests BOTTLES DRAWN AEROBIC ONLY 5CC     Culture  Setup Time       Value: 06/08/2013 14:10     Performed at [facility]   Culture       Value:        BLOOD CULTURE RECEIVED NO GROWTH TO DATE CULTURE WILL BE HELD FOR 5 DAYS BEFORE ISSUING A FINAL NEGATIVE REPORT     Performed at [facility]   Report Status PENDING    POCT I-STAT 3, BLOOD GAS (G3P V)     Status: Abnormal   Collection Time    06/08/13 10:55 AM      Result Value Range   pH, Ven 7.253  7.250 - 7.300   pCO2, Ven 48.8  45.0 - 50.0 mmHg   pO2, Ven 26.0 (*) 30.0 -  45.0 mmHg   Bicarbonate 21.5  20.0 - 24.0 mEq/L   TCO2 23  0 - 100 mmol/L   O2 Saturation 38.0     Acid-base deficit 6.0  (*) 0.0 - 2.0 mmol/L   Sample type VENOUS     Comment NOTIFIED PHYSICIAN    URINALYSIS, ROUTINE W REFLEX MICROSCOPIC     Status: Abnormal   Collection Time    06/08/13  3:05 PM      Result Value Range   Color, Urine YELLOW  YELLOW   APPearance CLOUDY (*) CLEAR   Specific Gravity, Urine 1.010  1.005 - 1.030   pH 5.5  5.0 - 8.0   Glucose, UA 100 (*) NEGATIVE mg/dL   Hgb urine dipstick SMALL (*) NEGATIVE   Bilirubin Urine NEGATIVE  NEGATIVE   Ketones, ur NEGATIVE  NEGATIVE mg/dL   Protein, ur >300 (*) NEGATIVE mg/dL   Urobilinogen, UA 0.2  0.0 - 1.0 mg/dL   Nitrite NEGATIVE  NEGATIVE   Leukocytes, UA NEGATIVE  NEGATIVE  URINE MICROSCOPIC-ADD ON     Status: Abnormal   Collection Time    06/08/13  3:05 PM      Result Value Range   Squamous Epithelial / LPF RARE  RARE   WBC, UA 0-2  <3 WBC/hpf   RBC / HPF 0-2  <3 RBC/hpf   Bacteria, UA FEW (*) RARE  GLUCOSE, CAPILLARY     Status: Abnormal   Collection Time    06/08/13  5:46 PM      Result Value Range   Glucose-Capillary 131 (*) 70 - 99 mg/dL  MRSA PCR SCREENING     Status: Abnormal   Collection Time    06/08/13  5:47 PM      Result Value Range   MRSA by PCR POSITIVE (*) NEGATIVE   Comment:            The GeneXpert MRSA Assay (FDA     approved for NASAL specimens     only), is one component of a     comprehensive MRSA colonization     surveillance program. It is not     intended to diagnose MRSA     infection nor to guide or     monitor treatment for     MRSA infections.     RESULT CALLED TO, READ BACK BY AND VERIFIED WITH:     C BUNGQUE RN [id number] 06/08/13 A BROWNING  CBC     Status: Abnormal   Collection Time    06/08/13  7:00 PM      Result Value Range   WBC 4.9  4.0 - 10.5 K/uL   RBC 3.79 (*) 3.87 - 5.11 MIL/uL   Hemoglobin 9.7 (*) 12.0 - 15.0 g/dL   Comment: DELTA CHECK NOTED     REPEATED TO VERIFY   HCT 31.5 (*) 36.0 - 46.0 %   MCV 83.1  78.0 - 100.0 fL   MCH 25.6 (*) 26.0 - 34.0 pg   MCHC 30.8  30.0 - 36.0  g/dL   RDW 18.4 (*) 11.5 - 15.5 %   Platelets 183  150 - 400 K/uL  CREATININE, SERUM     Status: Abnormal   Collection Time    06/08/13  7:00 PM      Result Value Range   Creatinine, Ser 1.93 (*) 0.50 - 1.10 mg/dL   GFR calc non Af Amer 25 (*) >90 mL/min   GFR calc Af Amer 29 (*) >90 mL/min  Comment: (NOTE)     The eGFR has been calculated using the CKD EPI equation.     This calculation has not been validated in all clinical situations.     eGFR's persistently <90 mL/min signify possible Chronic Kidney     Disease.  TSH     Status: None   Collection Time    06/08/13  7:00 PM      Result Value Range   TSH 1.108  0.350 - 4.500 uIU/mL   Comment: Performed at [facility]  TROPONIN I     Status: None   Collection Time    06/08/13  7:00 PM      Result Value Range   Troponin I <0.30  <0.30 ng/mL   Comment:            Due to the release kinetics of cTnI,     a negative result within the first hours     of the onset of symptoms does not rule out     myocardial infarction with certainty.     If myocardial infarction is still suspected,     repeat the test at appropriate intervals.  TROPONIN I     Status: None   Collection Time    06/09/13  2:31 AM      Result Value Range   Troponin I <0.30  <0.30 ng/mL   Comment:            Due to the release kinetics of cTnI,     a negative result within the first hours     of the onset of symptoms does not rule out     myocardial infarction with certainty.     If myocardial infarction is still suspected,     repeat the test at appropriate intervals.  PROTIME-INR     Status: Abnormal   Collection Time    06/09/13  3:00 AM      Result Value Range   Prothrombin Time 21.5 (*) 11.6 - 15.2 seconds   INR 1.93 (*) 0.00 - 1.49  BASIC METABOLIC PANEL     Status: Abnormal   Collection Time    06/09/13  3:00 AM      Result Value Range   Sodium 139  137 - 147 mEq/L   Potassium 4.6  3.7 - 5.3 mEq/L   Chloride 105  96 - 112 mEq/L   CO2  21  19 - 32 mEq/L   Glucose, Bld 134 (*) 70 - 99 mg/dL   BUN 32 (*) 6 - 23 mg/dL   Creatinine, Ser 0.45 (*) 0.50 - 1.10 mg/dL   Calcium 8.5  8.4 - 40.9 mg/dL   GFR calc non Af Amer 24 (*) >90 mL/min   GFR calc Af Amer 28 (*) >90 mL/min   Comment: (NOTE)     The eGFR has been calculated using the CKD EPI equation.     This calculation has not been validated in all clinical situations.     eGFR's persistently <90 mL/min signify possible Chronic Kidney     Disease.  CBC     Status: Abnormal   Collection Time    06/09/13  3:00 AM      Result Value Range   WBC 6.6  4.0 - 10.5 K/uL   RBC 3.57 (*) 3.87 - 5.11 MIL/uL   Hemoglobin 9.0 (*) 12.0 - 15.0 g/dL   HCT 81.1 (*) 91.4 - 78.2 %   MCV 81.5  78.0 - 100.0 fL  MCH 25.2 (*) 26.0 - 34.0 pg   MCHC 30.9  30.0 - 36.0 g/dL   RDW 18.4 (*) 11.5 - 15.5 %   Platelets 174  150 - 400 K/uL  TROPONIN I     Status: None   Collection Time    06/09/13  6:14 AM      Result Value Range   Troponin I <0.30  <0.30 ng/mL   Comment:            Due to the release kinetics of cTnI,     a negative result within the first hours     of the onset of symptoms does not rule out     myocardial infarction with certainty.     If myocardial infarction is still suspected,     repeat the test at appropriate intervals.  GLUCOSE, CAPILLARY     Status: Abnormal   Collection Time    06/09/13  9:41 AM      Result Value Range   Glucose-Capillary 151 (*) 70 - 99 mg/dL  GLUCOSE, CAPILLARY     Status: Abnormal   Collection Time    06/09/13 11:36 AM      Result Value Range   Glucose-Capillary 126 (*) 70 - 99 mg/dL  GLUCOSE, CAPILLARY     Status: Abnormal   Collection Time    06/09/13  4:57 PM      Result Value Range   Glucose-Capillary 129 (*) 70 - 99 mg/dL  GLUCOSE, CAPILLARY     Status: Abnormal   Collection Time    06/09/13  9:58 PM      Result Value Range   Glucose-Capillary 280 (*) 70 - 99 mg/dL  PROTIME-INR     Status: Abnormal   Collection Time     06/10/13  2:25 AM      Result Value Range   Prothrombin Time 26.7 (*) 11.6 - 15.2 seconds   INR 2.57 (*) 0.00 - 1.49  CBC     Status: Abnormal   Collection Time    06/10/13  2:25 AM      Result Value Range   WBC 8.9  4.0 - 10.5 K/uL   RBC 3.52 (*) 3.87 - 5.11 MIL/uL   Hemoglobin 9.0 (*) 12.0 - 15.0 g/dL   HCT 28.7 (*) 36.0 - 46.0 %   MCV 81.5  78.0 - 100.0 fL   MCH 25.6 (*) 26.0 - 34.0 pg   MCHC 31.4  30.0 - 36.0 g/dL   RDW 18.6 (*) 11.5 - 15.5 %   Platelets 210  150 - 400 K/uL  BASIC METABOLIC PANEL     Status: Abnormal   Collection Time    06/10/13  2:25 AM      Result Value Range   Sodium 141  137 - 147 mEq/L   Potassium 4.0  3.7 - 5.3 mEq/L   Chloride 106  96 - 112 mEq/L   CO2 23  19 - 32 mEq/L   Glucose, Bld 75  70 - 99 mg/dL   BUN 33 (*) 6 - 23 mg/dL   Creatinine, Ser 2.13 (*) 0.50 - 1.10 mg/dL   Calcium 8.5  8.4 - 10.5 mg/dL   GFR calc non Af Amer 22 (*) >90 mL/min   GFR calc Af Amer 26 (*) >90 mL/min   Comment: (NOTE)     The eGFR has been calculated using the CKD EPI equation.     This calculation has not been validated in all clinical situations.  eGFR's persistently <90 mL/min signify possible Chronic Kidney     Disease.  GLUCOSE, CAPILLARY     Status: None   Collection Time    06/10/13  7:57 AM      Result Value Range   Glucose-Capillary 79  70 - 99 mg/dL    Dg Chest 1 View  06/08/2013   CLINICAL DATA:  73 year old female status post right thoracentesis. Initial encounter.  EXAM: CHEST - 1 VIEW  COMPARISON:  0831 hr the same day and earlier.  FINDINGS: Portable AP semi upright view at at 1235 hrs. Improved lung volumes. Improved right lung base ventilation. No pneumothorax identified. Residual opacity at the left lung base. Stable cardiac size and mediastinal contours.  IMPRESSION: Improved ventilation and decreased right lung base opacity with no pneumothorax status post right thoracentesis.   Electronically Signed   By: Lars Pinks M.D.   On: 06/08/2013  12:59   Dg Chest Port 1 View  06/08/2013   CLINICAL DATA:  Short of breath.  EXAM: PORTABLE CHEST - 1 VIEW  COMPARISON:  06/08/2013.  FINDINGS: Cardiomegaly. Right upper extremity PICC is present with the tip in the mid SVC. Aortic arch atherosclerosis. Bilateral basilar airspace disease and atelectasis is present. This is favored to represent pulmonary edema. Small bilateral pleural effusions are present. Thoracolumbar fixation hardware incidentally noted. Monitoring leads project over the chest.  Compared to the prior, basilar aeration has worsened slightly.  IMPRESSION: Constellation of findings compatible with progressive mild CHF.   Electronically Signed   By: Dereck Ligas M.D.   On: 06/08/2013 17:15   Dg Chest Port 1 View  06/08/2013   CLINICAL DATA:  Chest pain and shortness of breath  EXAM: PORTABLE CHEST - 1 VIEW  COMPARISON:  Prior chest x-ray 06/08/2013  FINDINGS: New right upper extremity approach PICC. Catheter tip in good position at the distal SVC. Stable cardiac and mediastinal contours with mild cardiomegaly. Improving diffuse interstitial opacities. Small left and possibly trace right pleural effusions. The left effusion is stable to slightly smaller compared to prior. Atherosclerotic calcification again noted in the transverse aorta. No pneumothorax.  IMPRESSION: 1. Improving bilateral interstitial opacities may reflect resolving interstitial edema, or atypical infection. 2. Improving small left pleural effusion. Probable trace right pleural effusion as well. 3. Right upper extremity PICC with the catheter tip projecting over the distal SVC.   Electronically Signed   By: Jacqulynn Cadet M.D.   On: 06/08/2013 14:46   US Thoracentesis Asp Pleural Space W/img Guide  06/08/2013   CLINICAL DATA:  Congestive heart failure, shortness of breath. Recurrent right-sided pleural effusion. Request therapeutic thoracentesis.  EXAM: ULTRASOUND GUIDED right THORACENTESIS  COMPARISON:  Previous  thoracentesis  FINDINGS: A total of approximately 1.2 L of clear yellow fluid was removed. A fluid sample was notsent for laboratory analysis.  IMPRESSION: Successful ultrasound guided right thoracentesis yielding 1.2 L of pleural fluid.\  Read by: Ascencion Dike PA-C  PROCEDURE: An ultrasound guided thoracentesis was thoroughly discussed with the patient and questions answered. The benefits, risks, alternatives and complications were also discussed. The patient understands and wishes to proceed with the procedure. Written consent was obtained.  Ultrasound was performed to localize and mark an adequate pocket of fluid in the right chest. The area was then prepped and draped in the normal sterile fashion. 1% Lidocaine was used for local anesthesia. Under ultrasound guidance a 19 gauge Yueh catheter was introduced. Thoracentesis was performed. The catheter was removed and a dressing applied.  Complications:  None.   Electronically Signed   By: Markus Daft M.D.   On: 06/08/2013 13:20    Review of Systems  Constitutional: Negative for fever.  Respiratory: Positive for shortness of breath and wheezing.   Neurological: Positive for speech change, focal weakness (right side, non-ambulatory) and weakness.   Blood pressure 132/67, pulse 73, temperature 98 F (36.7 C), temperature source Oral, resp. rate 18, height $RemoveBe'5\' 5"'[id number]$  (1.651 m), weight 169 lb 12.1 oz (77 kg), SpO2 94.00%. Physical Exam  Vitals reviewed. Constitutional:  Elderly woman in NAD  HENT:  Head: Normocephalic and atraumatic.  Eyes: EOM are normal. Pupils are equal, round, and reactive to light.  Neck: Neck supple.  Cardiovascular: Normal rate, regular rhythm and normal heart sounds.   Respiratory: She has wheezes.  GI: Soft. There is no tenderness.  Lymphadenopathy:    She has no cervical adenopathy.  Neurological: She is alert.  Right hemiparesis  Skin: Skin is warm and dry.    Assessment/Plan: 73 yo woman with a recurrent transudative  pleural effusion. She has refractory CHF and a protein losing nephropathy.  She was tapped on this admission yielding 1.2 L of clear fluid. Her post thoracentesis CXR showed no significant residual pleural effusion. She feels more short of breath today, but is actively wheezing.  The primary goal is to correct the underlying causes of her recurrent effusions.   While a pleur-x catheter can be placed(once the effusion reaccumulates), the results are likely to unsatisfactory, unless the underlying causes are corrected medically as there is no end point. Her CHF was not adequately managed prior to this admission(BNP > 21K).  We would also need to be certain that her SNF will be able to manage the catheter as many facilities will not accept a patient with one.  Will repeat her CXR tomorrow and also her BNP. Work up of proteinuria is in progress.      Montrail Mehrer C 06/10/2013, 10:19 AM

## 2013-06-11 DIAGNOSIS — F0390 Unspecified dementia without behavioral disturbance: Secondary | ICD-10-CM

## 2013-06-11 DIAGNOSIS — N183 Chronic kidney disease, stage 3 unspecified: Secondary | ICD-10-CM

## 2013-06-11 LAB — GLUCOSE, CAPILLARY
GLUCOSE-CAPILLARY: 128 mg/dL — AB (ref 70–99)
Glucose-Capillary: 70 mg/dL (ref 70–99)
Glucose-Capillary: 140 mg/dL — ABNORMAL HIGH (ref 70–99)
Glucose-Capillary: 112 mg/dL — ABNORMAL HIGH (ref 70–99)

## 2013-06-11 LAB — PROTIME-INR
INR: 2.22 — ABNORMAL HIGH (ref 0.00–1.49)
Prothrombin Time: 23.9 seconds — ABNORMAL HIGH (ref 11.6–15.2)

## 2013-06-11 LAB — BASIC METABOLIC PANEL
BUN: 28 mg/dL — AB (ref 6–23)
CALCIUM: 8.4 mg/dL (ref 8.4–10.5)
CO2: 24 mEq/L (ref 19–32)
CREATININE: 1.88 mg/dL — AB (ref 0.50–1.10)
Chloride: 105 mEq/L (ref 96–112)
GFR calc Af Amer: 30 mL/min — ABNORMAL LOW (ref >90)
GFR, EST NON AFRICAN AMERICAN: 26 mL/min — AB (ref >90)
Glucose, Bld: 72 mg/dL (ref 70–99)
Potassium: 3.8 mEq/L (ref 3.7–5.3)
Sodium: 141 mEq/L (ref 137–147)

## 2013-06-11 LAB — PRO B NATRIURETIC PEPTIDE: Pro B Natriuretic peptide (BNP): 15417 pg/mL — ABNORMAL HIGH (ref 0–125)

## 2013-06-11 MED ORDER — FAMOTIDINE 20 MG PO TABS
20.0000 mg | ORAL_TABLET | Freq: Every day | ORAL | Status: DC
  Administered 2013-06-12 – 2013-06-17 (×6): 20 mg via ORAL
  Filled 2013-06-11 (×6): qty 1

## 2013-06-11 MED ORDER — SODIUM CHLORIDE 0.9 % IJ SOLN
10.0000 mL | INTRAMUSCULAR | Status: DC | PRN
  Administered 2013-06-11 – 2013-06-17 (×6): 10 mL

## 2013-06-11 MED ORDER — WARFARIN SODIUM 1 MG PO TABS
1.5000 mg | ORAL_TABLET | Freq: Once | ORAL | Status: AC
  Administered 2013-06-11: 1.5 mg via ORAL
  Filled 2013-06-11: qty 1

## 2013-06-11 MED ORDER — FUROSEMIDE 10 MG/ML IJ SOLN
40.0000 mg | Freq: Three times a day (TID) | INTRAMUSCULAR | Status: DC
  Administered 2013-06-11 – 2013-06-14 (×12): 40 mg via INTRAVENOUS
  Filled 2013-06-11 (×14): qty 4

## 2013-06-11 NOTE — Progress Notes (Signed)
Patient ID: Ariana [name], female   DOB: March 02, 1941, 73 y.o.   MRN: [id number]  Says her breathing is "pretty good"  BP 105/56  Pulse 75  Temp(Src) 98.4 F (36.9 C) (Oral)  Resp 20  Ht 5\' 5"  (1.651 m)  Wt 181 lb 4.8 oz (82.237 kg)  BMI 30.17 kg/m2  SpO2 92%   Intake/Output Summary (Last 24 hours) at 06/11/13 1015 Last data filed at 06/11/13 0507  Gross per 24 hour  Intake    100 ml  Output   1950 ml  Net  -1850 ml   Diminished BS right base  BNP down to 15,000  CXR yesterday showed some reaccumulation of right effusion  Would continue with diuresis and optimization of fluid status.

## 2013-06-11 NOTE — Progress Notes (Signed)
CSW spoke to patient's daughter in law by bedside about SNF. CSW explained to daughter in law that if patient were to get pleurx cath, [facility] would not be able to take patient back because [facility] stated they are not trained to do that. Patient's daughter in law understood and wrote this information down along with social worker's phone number. CSW explained that if the patient is to get the cath, social worker would have to find a new facility for patient. Daughter in law understood. However, if patient does not get pleurx cath, patient can return back to [facility]. CSW will continue to follow.   Maree KrabbeLindsay River Mckercher, MSW, Theresia MajorsLCSWA [phone]

## 2013-06-11 NOTE — Progress Notes (Signed)
SUBJECTIVE:  She says that she is breathing OK.  No acute distress   PHYSICAL EXAM Filed Vitals:   06/10/13 1458 06/10/13 2057 06/10/13 2100 06/11/13 0501  BP: 100/70 95/52  105/56  Pulse: 74   75  Temp: 97.4 F (36.3 C) 98.3 F (36.8 C)  98.4 F (36.9 C)  TempSrc: Oral Oral  Oral  Resp: 17 20  20   Height:      Weight:    181 lb 4.8 oz (82.237 kg)  SpO2: 94% 100% 100% 92%   General:  No distress Lungs:  Decreased breath sounds bilaterally Heart:  Irregular Abdomen:  Positive bowel sounds, no rebound no guarding Extremities:  No edema  LABS: Lab Results  Component Value Date   TROPONINI <0.30 06/09/2013   Results for orders placed during the hospital encounter of 06/08/13 (from the past 24 hour(s))  GLUCOSE, CAPILLARY     Status: None   Collection Time    06/10/13  7:57 AM      Result Value Range   Glucose-Capillary 79  70 - 99 mg/dL  GLUCOSE, CAPILLARY     Status: None   Collection Time    06/10/13 11:34 AM      Result Value Range   Glucose-Capillary 91  70 - 99 mg/dL  GLUCOSE, CAPILLARY     Status: None   Collection Time    06/10/13  4:26 PM      Result Value Range   Glucose-Capillary 87  70 - 99 mg/dL  GLUCOSE, CAPILLARY     Status: Abnormal   Collection Time    06/10/13  8:54 PM      Result Value Range   Glucose-Capillary 152 (*) 70 - 99 mg/dL   Comment 1 Documented in Chart     Comment 2 Notify RN    PROTIME-INR     Status: Abnormal   Collection Time    06/11/13  4:40 AM      Result Value Range   Prothrombin Time 23.9 (*) 11.6 - 15.2 seconds   INR 2.22 (*) 0.00 - 1.49  BASIC METABOLIC PANEL     Status: Abnormal   Collection Time    06/11/13  4:40 AM      Result Value Range   Sodium 141  137 - 147 mEq/L   Potassium 3.8  3.7 - 5.3 mEq/L   Chloride 105  96 - 112 mEq/L   CO2 24  19 - 32 mEq/L   Glucose, Bld 72  70 - 99 mg/dL   BUN 28 (*) 6 - 23 mg/dL   Creatinine, Ser 1.61 (*) 0.50 - 1.10 mg/dL   Calcium 8.4  8.4 - 09.6 mg/dL   GFR calc non Af  Amer 26 (*) >90 mL/min   GFR calc Af Amer 30 (*) >90 mL/min  PRO B NATRIURETIC PEPTIDE     Status: Abnormal   Collection Time    06/11/13  4:40 AM      Result Value Range   Pro B Natriuretic peptide (BNP) 15417.0 (*) 0 - 125 pg/mL  GLUCOSE, CAPILLARY     Status: None   Collection Time    06/11/13  6:35 AM      Result Value Range   Glucose-Capillary 70  70 - 99 mg/dL    Intake/Output Summary (Last 24 hours) at 06/11/13 0741 Last data filed at 06/11/13 0507  Gross per 24 hour  Intake    200 ml  Output   1950 ml  Net  -1750 ml    ASSESSMENT AND PLAN:  ACUTE ON CHRONIC DIASTOLIC HF:  No plans for permanent indwelling pleural catheter at this point.   I will try to increase to tid Lasix.    HYPOTENSION:  BP low but stable.  Precludes med titratioin.    PAROXYSMAL ATRIAL FIB:  Warfarin per pharmacy.  Atrial fib.  Might need to consider DCCV in the weeks to come if this proves not to be paroxysmal and conservative therapy doesn't work.  Currently rate is controlled.   CKD:  Creat is better.    ANEMIA:  No CBC today.   Fayrene FearingJames [facility]rein 06/11/2013 7:41 AM

## 2013-06-11 NOTE — Progress Notes (Signed)
[facility] PROGRESS NOTE  Waymon AmatoShirley Ruis ZOX:[id number] DOB: [birth year] DOA: 06/08/2013 PCP: Garlan FillersPATERSON,DANIEL G, MD  Assessment/Plan: 1. Recurrent right-sided pleural effusions. Patient having multiple thoracentesis procedures over the last several months, undergoing thoracentesis during this hospitalization with the removal of 1.2 L of fluid. She has been seen and evaluated by CT surgery who recommended treating underlying cause of recurrent effusions as Pleurx catheter placement would likely B. Unsatisfactory. Cardiology increasing Lasix dose at 3 times a day dosing. 2. Acute on chronic diastolic congestive heart failure. BNP elevated at 15,417. Last transthoracic echocardiogram was performed on 05/08/2013 which showed normal ejection fraction, with wall thickness increased in pattern of severe LVH. She remains on Lasix 40 mg IV 3 times a day. 3. Paroxysmal age of fibrillation. Presently rate controlled, continue metoprolol 12.5 mg by mouth twice a day. Presently rate controlled. 4. Insulin-dependent diabetes mellitus. Hemoglobin A1c of 6.1 on 05/30/2013. Continue Accu-Cheks q. a.c. and each bedtime with sliding scale coverage. Continue insulin detemir.  5. Stage III chronic kidney disease. Stable, patient's creatinine at 1.88 on this mornings lab work.  Code Status: Full code Family Communication: Patient is daughter-in-law present at bedside and updated. Disposition Plan: Continue IV diuresis.   Consultants:  Cardiology  CT surgery  Procedures:  Ultrasound-guided thoracentesis   HPI/Subjective: Patient is a pleasant 73 year old female with a past medical history of chronic diastolic congestive heart failure, recurrent right sided pleural effusions requiring multiple thoracentesis procedures in the past, admitted to the medicine service on 06/08/2013 when she presented with shortness of breath. Chest x-ray showed large right-sided pleural effusion. It appears that she had a  right pleural drain catheter placed by interventional radiology on 04/20/2013, pulled at bedside on 04/26/2013 to 2 intolerance. Previous workup of fluid has been consistent with transudate of pleural effusion. She underwent thoracentesis with 1.2 L removal of fluid. Cardiology recommended CT surgery consultation for Pleurx catheter placement.  Objective: Filed Vitals:   06/11/13 0501  BP: 105/56  Pulse: 75  Temp: 98.4 F (36.9 C)  Resp: 20    Intake/Output Summary (Last 24 hours) at 06/11/13 1219 Last data filed at 06/11/13 0756  Gross per 24 hour  Intake    340 ml  Output   1950 ml  Net  -1610 ml   Filed Weights   06/09/13 0200 06/10/13 0226 06/11/13 0501  Weight: 77.5 kg (170 lb 13.7 oz) 77 kg (169 lb 12.1 oz) 82.237 kg (181 lb 4.8 oz)    Exam:   General:  Patient is in no acute distress, feels that she is breathing easier this morning.  Cardiovascular: Normal S1-S2 no murmurs rubs or gallops  Respiratory: Patient having by basilar crackles with diminished breath sounds at bases. On supplemental oxygen, does not appear to be in acute respiratory distress.  Abdomen: Soft nontender nondistended  Musculoskeletal: Present range of motion to all extremities  Data Reviewed: Basic Metabolic Panel:  Recent Labs Lab 06/08/13 0857 06/08/13 1900 06/09/13 0300 06/10/13 0225 06/11/13 0440  NA 140  --  139 141 141  K 4.7  --  4.6 4.0 3.8  CL 107  --  105 106 105  CO2  --   --  21 23 24   GLUCOSE 132*  --  134* 75 72  BUN 30*  --  32* 33* 28*  CREATININE 1.90* 1.93* 1.99* 2.13* 1.88*  CALCIUM  --   --  8.5 8.5 8.4   Liver Function Tests: No results found for this basename: AST, ALT, ALKPHOS,  BILITOT, PROT, ALBUMIN,  in the last 168 hours No results found for this basename: LIPASE, AMYLASE,  in the last 168 hours No results found for this basename: AMMONIA,  in the last 168 hours CBC:  Recent Labs Lab 06/08/13 0842 06/08/13 0857 06/08/13 1900 06/09/13 0300  06/10/13 0225  WBC 7.8  --  4.9 6.6 8.9  NEUTROABS 5.8  --   --   --   --   HGB 10.1* 12.6 9.7* 9.0* 9.0*  HCT 32.4* 37.0 31.5* 29.1* 28.7*  MCV 81.8  --  83.1 81.5 81.5  PLT 216  --  183 174 210   Cardiac Enzymes:  Recent Labs Lab 06/08/13 1035 06/08/13 1900 06/09/13 0231 06/09/13 0614  TROPONINI <0.30 <0.30 <0.30 <0.30   BNP (last 3 results)  Recent Labs  05/08/13 1054 06/08/13 0842 06/11/13 0440  PROBNP 17420.0* 21847.0* 15417.0*   CBG:  Recent Labs Lab 06/10/13 1134 06/10/13 1626 06/10/13 2054 06/11/13 0635 06/11/13 1135  GLUCAP 91 87 152* 70 128*    Recent Results (from the past 240 hour(s))  CULTURE, BLOOD (ROUTINE X 2)     Status: None   Collection Time    06/08/13 10:35 AM      Result Value Range Status   Specimen Description BLOOD LEFT ANTECUBITAL   Final   Special Requests BOTTLES DRAWN AEROBIC AND ANAEROBIC 10CC   Final   Culture  Setup Time     Final   Value: 06/08/2013 14:10     Performed at [facility]   Culture     Final   Value:        BLOOD CULTURE RECEIVED NO GROWTH TO DATE CULTURE WILL BE HELD FOR 5 DAYS BEFORE ISSUING A FINAL NEGATIVE REPORT     Performed at [facility]   Report Status PENDING   Incomplete  CULTURE, BLOOD (ROUTINE X 2)     Status: None   Collection Time    06/08/13 10:45 AM      Result Value Range Status   Specimen Description BLOOD RIGHT HAND   Final   Special Requests BOTTLES DRAWN AEROBIC ONLY 5CC   Final   Culture  Setup Time     Final   Value: 06/08/2013 14:10     Performed at [facility]   Culture     Final   Value:        BLOOD CULTURE RECEIVED NO GROWTH TO DATE CULTURE WILL BE HELD FOR 5 DAYS BEFORE ISSUING A FINAL NEGATIVE REPORT     Performed at [facility]   Report Status PENDING   Incomplete  MRSA PCR SCREENING     Status: Abnormal   Collection Time    06/08/13  5:47 PM      Result Value Range Status   MRSA by PCR POSITIVE (*) NEGATIVE Final   Comment:             The GeneXpert MRSA Assay (FDA     approved for NASAL specimens     only), is one component of a     comprehensive MRSA colonization     surveillance program. It is not     intended to diagnose MRSA     infection nor to guide or     monitor treatment for     MRSA infections.     RESULT CALLED TO, READ BACK BY AND VERIFIED WITHIzetta Dakin RN [id number] 06/08/13 A BROWNING  Studies: Dg Chest 2 View  06/10/2013   CLINICAL DATA:  Short of breath, bilateral pleural effusions  EXAM: CHEST  2 VIEW  COMPARISON:  Prior chest x-ray 06/08/2013  FINDINGS: Large right and moderate left effusions. The right effusion is layering, there may be some mild loculation in the left base. No pneumothorax. Stable cardiomegaly. Aortic atherosclerosis. Incompletely imaged lumbar spine fixation hardware.  IMPRESSION: 1. Large layering right pleural effusion. 2. Moderate left pleural effusion with suggestion of mild basilar loculation. 3. Cardiomegaly and aortic atherosclerosis.   Electronically Signed   By: Malachy Moan M.D.   On: 06/10/2013 17:47    Scheduled Meds: . aspirin  81 mg Oral Daily  . atorvastatin  20 mg Oral QHS  . Chlorhexidine Gluconate Cloth  6 each Topical Q0600  . clonazePAM  0.5 mg Oral q morning - 10a  . clonazePAM  1 mg Oral QHS  . docusate sodium  100 mg Oral BID  . famotidine  20 mg Oral BID  . furosemide  40 mg Intravenous TID  . insulin aspart  0-5 Units Subcutaneous QHS  . insulin aspart  0-9 Units Subcutaneous TID WC  . insulin detemir  7 Units Subcutaneous BID  . ipratropium-albuterol  3 mL Nebulization TID  . metoprolol tartrate  12.5 mg Oral BID  . mupirocin ointment  1 application Nasal BID  . senna-docusate  1 tablet Oral QHS  . traZODone  50 mg Oral QHS  . venlafaxine XR  75 mg Oral Daily  . warfarin  1.5 mg Oral ONCE-1800  . Warfarin - Pharmacist Dosing Inpatient   Does not apply q1800   Continuous Infusions:   Active Problems:   Recurrent pleural effusion on  right   Paroxysmal atrial fibrillation   Dysarthria due to cerebrovascular accident   Hemiparesis affecting right side as late effect of cerebrovascular accident   GERD (gastroesophageal reflux disease)   Dementia   Acute respiratory failure with hypoxia   Acute diastolic heart failure   Anemia   Diabetes mellitus   CKD (chronic kidney disease), stage III   Hypotension   Chronic respiratory failure with hypoxia    Time spent: 35 minutes    Jeralyn Bennett  [facility] Pager [phone]. If 7PM-7AM, please contact night-coverage at [email or web address], password [facility] 06/11/2013, 12:19 PM  LOS: 3 days

## 2013-06-11 NOTE — Progress Notes (Signed)
Family is requesting to speak to the cardiothoracic surgeon; MD paged and notified; MD will follow up with family tomorrow.  [name], Ariana BasemanHEATHER M

## 2013-06-11 NOTE — Progress Notes (Signed)
ANTICOAGULATION CONSULT NOTE   Pharmacy Consult for warfarin Indication: atrial fibrillation  Allergies  Allergen Reactions  . Baclofen   . Clindamycin/Lincomycin     Patient Measurements: Height: 5\' 5"  (165.1 cm) Weight: 181 lb 4.8 oz (82.237 kg) IBW/kg (Calculated) : 57  Vital Signs: Temp: 98.4 F (36.9 C) (01/15 0501) Temp src: Oral (01/15 0501) BP: 105/56 mmHg (01/15 0501) Pulse Rate: 75 (01/15 0501)  Labs:  Recent Labs  06/08/13 1900 06/09/13 0231 06/09/13 0300 06/09/13 0614 06/10/13 0225 06/11/13 0440  HGB 9.7*  --  9.0*  --  9.0*  --   HCT 31.5*  --  29.1*  --  28.7*  --   PLT 183  --  174  --  210  --   LABPROT  --   --  21.5*  --  26.7* 23.9*  INR  --   --  1.93*  --  2.57* 2.22*  CREATININE 1.93*  --  1.99*  --  2.13* 1.88*  TROPONINI <0.30 <0.30  --  <0.30  --   --     Estimated Creatinine Clearance: 29.1 ml/min (by C-G formula based on Cr of 1.88).  Assessment: 73 yo lady on warfarin for afib.  PTA dose 1.5 mg daily.  INR on admit 1.78. INR has now been therapeutic for 2 days- this morning 2.22. No bleeding noted. H/H has decreased slightly over the past few days, platelets are stable and WNL. S/p right thoracentesis on 1/12 with 1.2L removed.   Goal of Therapy:  INR 2-3 Monitor platelets by anticoagulation protocol: Yes   Plan:  1. warfarin 1.5 mg po x1 tonight 2. Daily PT/INR 3. CBC tomorrow 4. Monitor for s/s of bleeding   Marigny Borre D. Judd Mccubbin, PharmD, BCPS Clinical Pharmacist Pager: [phone] 06/11/2013 11:52 AM

## 2013-06-12 ENCOUNTER — Inpatient Hospital Stay (HOSPITAL_COMMUNITY): Payer: Medicare Other

## 2013-06-12 DIAGNOSIS — J9610 Chronic respiratory failure, unspecified whether with hypoxia or hypercapnia: Secondary | ICD-10-CM

## 2013-06-12 DIAGNOSIS — R0902 Hypoxemia: Secondary | ICD-10-CM

## 2013-06-12 LAB — PROTIME-INR
INR: 1.71 — ABNORMAL HIGH (ref 0.00–1.49)
Prothrombin Time: 19.6 seconds — ABNORMAL HIGH (ref 11.6–15.2)

## 2013-06-12 LAB — CBC
HCT: 30.5 % — ABNORMAL LOW (ref 36.0–46.0)
Hemoglobin: 9.2 g/dL — ABNORMAL LOW (ref 12.0–15.0)
MCH: 25.3 pg — ABNORMAL LOW (ref 26.0–34.0)
MCHC: 30.2 g/dL (ref 30.0–36.0)
MCV: 84 fL (ref 78.0–100.0)
PLATELETS: 199 10*3/uL (ref 150–400)
RBC: 3.63 MIL/uL — ABNORMAL LOW (ref 3.87–5.11)
RDW: 18.2 % — AB (ref 11.5–15.5)
WBC: 5.4 10*3/uL (ref 4.0–10.5)

## 2013-06-12 LAB — GLUCOSE, CAPILLARY
GLUCOSE-CAPILLARY: 93 mg/dL (ref 70–99)
Glucose-Capillary: 133 mg/dL — ABNORMAL HIGH (ref 70–99)
Glucose-Capillary: 108 mg/dL — ABNORMAL HIGH (ref 70–99)
Glucose-Capillary: 154 mg/dL — ABNORMAL HIGH (ref 70–99)

## 2013-06-12 MED ORDER — WARFARIN SODIUM 2 MG PO TABS
2.0000 mg | ORAL_TABLET | Freq: Once | ORAL | Status: AC
  Administered 2013-06-12: 2 mg via ORAL
  Filled 2013-06-12: qty 1

## 2013-06-12 NOTE — Progress Notes (Signed)
Sitting up in chair, no respiratory distress  She says her breathing is "not good"  BP 112/57  Pulse 67  Temp(Src) 97.5 F (36.4 C) (Oral)  Resp 18  Ht 5\' 5"  (1.651 m)  Wt 167 lb 8 oz (75.978 kg)  BMI 27.87 kg/m2  SpO2 100%   Intake/Output Summary (Last 24 hours) at 06/12/13 1559 Last data filed at 06/12/13 1300  Gross per 24 hour  Intake    600 ml  Output    800 ml  Net   -200 ml   100 % sat on 3L [place]  Decreased BS in bases, no wheezing at present  CXR shows moderate bilateral effusions unchanged from 1/14  Continue with medical therapy for now

## 2013-06-12 NOTE — Progress Notes (Signed)
Clinical Child psychotherapistocial Worker (CSW) updated [facility] admissions coordinator [facility] that the Plurx Catheter is still pending. [place] other [place] Facilities that can take a patient with a Plurx Catheter. Weekday CSW made family aware that patient can not return to [facility] if she gets a Plurx Catheter.   Jetta LoutBailey Morgan, LCSWA Weekend CSW [phone]

## 2013-06-12 NOTE — Progress Notes (Signed)
PT Cancellation Note  Patient Details Name: Ariana AmatoShirley Dymek MRN: [id number] DOB: 05/25/1941   Cancelled Treatment:    Reason Eval/Treat Not Completed: Patient at procedure or test/unavailable--down for test earlier today, eating dinner, then with respiratory, and now with nursing.   Ramces Shomaker 06/12/2013, 5:30 PM Pager [phone]

## 2013-06-12 NOTE — Progress Notes (Signed)
ANTICOAGULATION CONSULT NOTE   Pharmacy Consult for warfarin Indication: atrial fibrillation  Allergies  Allergen Reactions  . Baclofen   . Clindamycin/Lincomycin     Patient Measurements: Height: 5\' 5"  (165.1 cm) Weight: 167 lb 8 oz (75.978 kg) IBW/kg (Calculated) : 57  Vital Signs: Temp: 97.7 F (36.5 C) (01/16 0509) Temp src: Oral (01/16 0509) BP: 114/97 mmHg (01/16 0509) Pulse Rate: 64 (01/16 0509)  Labs:  Recent Labs  06/10/13 0225 06/11/13 0440 06/12/13 0442  HGB 9.0*  --  9.2*  HCT 28.7*  --  30.5*  PLT 210  --  199  LABPROT 26.7* 23.9* 19.6*  INR 2.57* 2.22* 1.71*  CREATININE 2.13* 1.88*  --     Estimated Creatinine Clearance: 28 ml/min (by C-G formula based on Cr of 1.88).  Assessment: Ariana [name] on warfarin for afib.  PTA dose 1.5 mg daily.  INR on admit 1.78. INR has now been therapeutic for 2 days- this morning 1.71 - unsure why Coumadin dose d/c 1/14 prob reason for drop in INR.  No bleeding noted. H/H low stable,  platelets are stable and WNL. S/p right thoracentesis on 1/12 with 1.2L removed. Follow up plan for effusion.    Goal of Therapy:  INR 2-3 Monitor platelets by anticoagulation protocol: Yes   Plan:  1. warfarin 2mg  po x1 tonight 2. Daily PT/INR 3 Monitor for s/s of bleeding   Leota SauersLisa Ottie Tillery Pharm.D. CPP, BCPS Clinical Pharmacist [phone] 06/12/2013 10:34 AM

## 2013-06-12 NOTE — Progress Notes (Signed)
Speech Language Pathology Treatment: Dysphagia  Patient Details Name: Ariana [name] MRN: [id number] DOB: 27-Feb-1941 Today's Date: 06/12/2013 Time: 0950-1007 SLP Time Calculation (min): 17 min  Assessment / Plan / Recommendation Clinical Impression  Pt demonstrates no overt evidence of aspiration though voice is hoarse and pt is short of breath. Pt did have adequate inspiration/swallow/expiration pattern independently. Reinforced precautions and strategies via verbal cues and demonstration. Pt was not able to repeat, needed Yes/No cues to verbalize correct responses. Overall, pt is at ongoing moderate risk of intermittent aspiration based on respiratory status. Pt is able to take breaks with fatigue, which is best strategy. Education complete at this time, will sign off.    HPI HPI: 73 yo female adm to [name] with shortness of breath, diagnosed with recurrent right pleural effusion s/p thoracentesis.  PMH + for CVA with Rt HP, GERD, dysarthria.  Pt CXR 1/12 showed progressive mild CHF.  Pt denies dysphagia but admits to shortness of breath with po intake decreasing her intake.  BSE completed yesterday with recommendation for soft/thin - SLP can not rule out silent aspiration at bedside but also ? component of esophageal issues given h/o GERD and pt not currently on a PPI.  Today pt seen to assess po tolerance.     Pertinent Vitals NA  SLP Plan  All goals met    Recommendations Diet recommendations: Thin liquid;Dysphagia 3 (mechanical soft) Liquids provided via: Cup;No straw Medication Administration: Whole meds with liquid Supervision: Intermittent supervision to cue for compensatory strategies Compensations: Slow rate;Small sips/bites;Check for pocketing Postural Changes and/or Swallow Maneuvers: Seated upright 90 degrees;Upright 30-60 min after meal              Oral Care Recommendations: Oral care BID Follow up Recommendations: Skilled Nursing facility Plan: All goals met    GO      Ariana [name], Ariana [name] 06/12/2013, 10:10 AM

## 2013-06-12 NOTE — Progress Notes (Signed)
Subjective: No complaints. Denies CP. Breathing Ok on 2L [place].   Objective: Vital signs in last 24 hours: Temp:  [97.7 F (36.5 C)-98.7 F (37.1 C)] 97.7 F (36.5 C) (01/16 0509) Pulse Rate:  [64-77] 64 (01/16 0509) Resp:  [18-20] 20 (01/16 0509) BP: (105-120)/(61-97) 114/97 mmHg (01/16 0509) SpO2:  [91 %-100 %] 97 % (01/16 0509) Weight:  [167 lb 8 oz (75.978 kg)] 167 lb 8 oz (75.978 kg) (01/16 0703) Last BM Date: 06/10/13  Intake/Output from previous day: 01/15 0701 - 01/16 0700 In: 720 [P.O.:720] Out: 750 [Urine:750] Intake/Output this shift:    Medications Current Facility-Administered Medications  Medication Dose Route Frequency Provider Last Rate Last Dose  . acetaminophen (TYLENOL) tablet 650 mg  650 mg Oral Q6H PRN Stephani PoliceMarianne L York, PA-C   650 mg at 06/11/13 1727   Or  . acetaminophen (TYLENOL) suppository 650 mg  650 mg Rectal Q6H PRN Stephani PoliceMarianne L York, PA-C      . alum & mag hydroxide-simeth (MAALOX/MYLANTA) 200-200-20 MG/5ML suspension 30 mL  30 mL Oral Q6H PRN Stephani PoliceMarianne L York, PA-C      . aspirin chewable tablet 81 mg  81 mg Oral Daily Stephani PoliceMarianne L York, PA-C   81 mg at 06/11/13 1036  . atorvastatin (LIPITOR) tablet 20 mg  20 mg Oral QHS Stephani PoliceMarianne L York, PA-C   20 mg at 06/11/13 2251  . Chlorhexidine Gluconate Cloth 2 % PADS 6 each  6 each Topical Q0600 Clydia LlanoMutaz Elmahi, MD   6 each at 06/12/13 0615  . clonazePAM (KLONOPIN) tablet 0.5 mg  0.5 mg Oral q morning - 10a Russella DarAllison L Ellis, NP   0.5 mg at 06/11/13 1036  . clonazePAM (KLONOPIN) tablet 1 mg  1 mg Oral QHS Russella DarAllison L Ellis, NP   1 mg at 06/11/13 2251  . docusate sodium (COLACE) capsule 100 mg  100 mg Oral BID Stephani PoliceMarianne L York, PA-C   100 mg at 06/11/13 2251  . famotidine (PEPCID) tablet 20 mg  20 mg Oral Daily Jeralyn BennettEzequiel Zamora, MD      . furosemide (LASIX) injection 40 mg  40 mg Intravenous TID Rollene RotundaJames Braylinn Gulden, MD   40 mg at 06/11/13 2250  . insulin aspart (novoLOG) injection 0-5 Units  0-5 Units Subcutaneous QHS Russella DarAllison  L Ellis, NP   3 Units at 06/09/13 2210  . insulin aspart (novoLOG) injection 0-9 Units  0-9 Units Subcutaneous TID WC Russella DarAllison L Ellis, NP   1 Units at 06/11/13 1727  . insulin detemir (LEVEMIR) injection 7 Units  7 Units Subcutaneous BID Russella DarAllison L Ellis, NP   7 Units at 06/11/13 2250  . ipratropium-albuterol (DUONEB) 0.5-2.5 (3) MG/3ML nebulizer solution 3 mL  3 mL Nebulization TID Lonia BloodJeffrey T McClung, MD   3 mL at 06/11/13 2203  . metoprolol tartrate (LOPRESSOR) tablet 12.5 mg  12.5 mg Oral BID Stephani PoliceMarianne L York, PA-C   12.5 mg at 06/11/13 2252  . mupirocin ointment (BACTROBAN) 2 % 1 application  1 application Nasal BID Clydia LlanoMutaz Elmahi, MD   1 application at 06/11/13 2251  . ondansetron (ZOFRAN) tablet 4 mg  4 mg Oral Q6H PRN Stephani PoliceMarianne L York, PA-C       Or  . ondansetron [facility](ZOFRAN) injection 4 mg  4 mg Intravenous Q6H PRN Stephani PoliceMarianne L York, PA-C      . senna-docusate (Senokot-S) tablet 1 tablet  1 tablet Oral QHS Stephani PoliceMarianne L York, PA-C   1 tablet at 06/11/13 2252  . sennosides-docusate sodium (  SENOKOT-S) 8.6-50 MG tablet 1 tablet  1 tablet Oral Daily PRN Lonia Blood, MD   1 tablet at 06/11/13 1036  . sodium chloride 0.9 % injection 10-40 mL  10-40 mL Intracatheter PRN Jeralyn Bennett, MD   10 mL at 06/12/13 0440  . traZODone (DESYREL) tablet 50 mg  50 mg Oral QHS Russella Dar, NP   50 mg at 06/11/13 2251  . venlafaxine XR (EFFEXOR-XR) 24 hr capsule 75 mg  75 mg Oral Daily Stephani Police, PA-C   75 mg at 06/11/13 1036  . Warfarin - Pharmacist Dosing Inpatient   Does not apply [id number] Clydia Llano, MD        PE: General appearance: alert, cooperative and no distress Neck: + JVD Lungs: decreased breath sounds bilaterally Heart: irregularly irregular rhythm Extremities: no LEE Pulses: 2+ and symmetric Skin: warm and dry Neurologic: Grossly normal  Lab Results:   Recent Labs  06/10/13 0225 06/12/13 0442  WBC 8.9 5.4  HGB 9.0* 9.2*  HCT 28.7* 30.5*  PLT 210 199   BMET  Recent Labs   06/10/13 0225 06/11/13 0440  NA 141 141  K 4.0 3.8  CL 106 105  CO2 23 24  GLUCOSE 75 72  BUN 33* 28*  CREATININE 2.13* 1.88*  CALCIUM 8.5 8.4   PT/INR  Recent Labs  06/10/13 0225 06/11/13 0440 06/12/13 0442  LABPROT 26.7* 23.9* 19.6*  INR 2.57* 2.22* 1.71*   BNP (last 3 results)  Recent Labs  05/08/13 1054 06/08/13 0842 06/11/13 0440  PROBNP 17420.0* 84132.4* 15417.0*    Assessment/Plan   Active Problems:   Recurrent pleural effusion on right   Paroxysmal atrial fibrillation   Dysarthria due to cerebrovascular accident   Hemiparesis affecting right side as late effect of cerebrovascular accident   GERD (gastroesophageal reflux disease)   Dementia   Acute respiratory failure with hypoxia   Acute diastolic heart failure   Anemia   Diabetes mellitus   CKD (chronic kidney disease), stage III   Hypotension   Chronic respiratory failure with hypoxia   Plan: diuresing well. Net negative -4,250 L. BNP was rechecked yesterday and had improved to 15,417 (21,847 on admission). Now on 40 mg IV Lasix TID. BP is stable. Consider rechecking BNP in 1-2 days. Will need to order BMP to follow electrolytes and renal function. If hypokalemic will need repletion. She remains in atrial fibrillation with a controlled ventricular response. HR in the 70s. Continue on current dose of Lopressor. INR is sub therapeutic at 1.88. Goal is 2.0-3.0. Follow pharmacy's dosing instructions.    LOS: 4 days    Ariana [name] Islam 06/12/2013 8:31 AM  History and all data above reviewed.  Patient examined.  I agree with the findings as above. Some difficulty with communication.  However, she does not think that she is breathing better. The patient exam reveals [id number]  ,  Lungs: Decreased breath sounds at both bases.   ,  Abd: Positive bowel sounds, no rebound no guarding, Ext No edema  .  All available labs, radiology testing, previous records reviewed. Agree with documented  assessment and plan. Limited options.  Continue current diuresis.    Fayrene Fearing [facility]  9:46 AM  06/12/2013

## 2013-06-12 NOTE — Progress Notes (Signed)
[facility] PROGRESS NOTE  Ariana [name] ZOX:[id number] DOB: 05-Jun-1940 DOA: 06/08/2013 PCP: Garlan Fillers, MD  Assessment/Plan: 1. Recurrent right-sided pleural effusions. Patient having multiple thoracentesis procedures over the last several months, undergoing thoracentesis during this hospitalization with the removal of 1.2 L of fluid. She has been seen and evaluated by CT surgery who recommended treating underlying cause of recurrent effusions as Pleurx catheter placement would likely B. Unsatisfactory. She remains on Lasix 40 mg IV 3 times a day. Repeat chest x-ray showing moderate bilateral pleural effusions with no interval change since study on 06/10/2013. 2. Acute on chronic diastolic congestive heart failure. BNP elevated at 15,417. Last transthoracic echocardiogram was performed on 05/08/2013 which showed normal ejection fraction, with wall thickness increased in pattern of severe LVH. She remains on Lasix 40 mg IV 3 times a day. Cardiology following. 3. Paroxysmal age of fibrillation. Presently rate controlled, continue metoprolol 12.5 mg by mouth twice a day. Presently rate controlled. 4. Insulin-dependent diabetes mellitus. Hemoglobin A1c of 6.1 on 05/30/2013. Continue Accu-Cheks q. a.c. and each bedtime with sliding scale coverage. Continue insulin detemir.  5. Stage III chronic kidney disease. Stable, patient's creatinine at 1.88 on this mornings lab work.  Code Status: Full code Family Communication: Patient is daughter-in-law present at bedside and updated. Disposition Plan: Continue IV diuresis.   Consultants:  Cardiology  CT surgery  Procedures:  Ultrasound-guided thoracentesis   HPI/Subjective: Patient is a pleasant 73 year old female with a past medical history of chronic diastolic congestive heart failure, recurrent right sided pleural effusions requiring multiple thoracentesis procedures in the past, admitted to the medicine service on 06/08/2013 when  she presented with shortness of breath. Chest x-ray showed large right-sided pleural effusion. It appears that she had a right pleural drain catheter placed by interventional radiology on 04/20/2013, pulled at bedside on 04/26/2013 to 2 intolerance. Previous workup of fluid has been consistent with transudate of pleural effusion. She underwent thoracentesis with 1.2 L removal of fluid. This morning patient reports having increased shortness of breath and is concerned about pleural effusions reaccumulating. Otherwise remains on Lasix 40 mg IV 3 times a day having good urinary output. There is a chest x-ray which showed moderate bilateral pleural effusions unchanged from previous study on 06/10/2013.  Objective: Filed Vitals:   06/12/13 0509  BP: 114/97  Pulse: 64  Temp: 97.7 F (36.5 C)  Resp: 20    Intake/Output Summary (Last 24 hours) at 06/12/13 1317 Last data filed at 06/12/13 0800  Gross per 24 hour  Intake    480 ml  Output    400 ml  Net     80 ml   Filed Weights   06/10/13 0226 06/11/13 0501 06/12/13 0703  Weight: 77 kg (169 lb 12.1 oz) 82.237 kg (181 lb 4.8 oz) 75.978 kg (167 lb 8 oz)    Exam:   General:  Patient is in no acute distress, feels that she is breathing easier this morning.  Cardiovascular: Normal S1-S2 no murmurs rubs or gallops  Respiratory: Patient having by basilar crackles with diminished breath sounds at bases. On supplemental oxygen, does not appear to be in acute respiratory distress.  Abdomen: Soft nontender nondistended  Musculoskeletal: Present range of motion to all extremities  Data Reviewed: Basic Metabolic Panel:  Recent Labs Lab 06/08/13 0857 06/08/13 1900 06/09/13 0300 06/10/13 0225 06/11/13 0440  NA 140  --  139 141 141  K 4.7  --  4.6 4.0 3.8  CL 107  --  105 106  105  CO2  --   --  21 23 24   GLUCOSE 132*  --  134* 75 72  BUN 30*  --  32* 33* 28*  CREATININE 1.90* 1.93* 1.99* 2.13* 1.88*  CALCIUM  --   --  8.5 8.5 8.4    Liver Function Tests: No results found for this basename: AST, ALT, ALKPHOS, BILITOT, PROT, ALBUMIN,  in the last 168 hours No results found for this basename: LIPASE, AMYLASE,  in the last 168 hours No results found for this basename: AMMONIA,  in the last 168 hours CBC:  Recent Labs Lab 06/08/13 0842 06/08/13 0857 06/08/13 1900 06/09/13 0300 06/10/13 0225 06/12/13 0442  WBC 7.8  --  4.9 6.6 8.9 5.4  NEUTROABS 5.8  --   --   --   --   --   HGB 10.1* 12.6 9.7* 9.0* 9.0* 9.2*  HCT 32.4* 37.0 31.5* 29.1* 28.7* 30.5*  MCV 81.8  --  83.1 81.5 81.5 84.0  PLT 216  --  183 174 210 199   Cardiac Enzymes:  Recent Labs Lab 06/08/13 1035 06/08/13 1900 06/09/13 0231 06/09/13 0614  TROPONINI <0.30 <0.30 <0.30 <0.30   BNP (last 3 results)  Recent Labs  05/08/13 1054 06/08/13 0842 06/11/13 0440  PROBNP 17420.0* 21847.0* 15417.0*   CBG:  Recent Labs Lab 06/11/13 0635 06/11/13 1135 06/11/13 1701 06/11/13 2044 06/12/13 0546  GLUCAP 70 128* 140* 112* 93    Recent Results (from the past 240 hour(s))  CULTURE, BLOOD (ROUTINE X 2)     Status: None   Collection Time    06/08/13 10:35 AM      Result Value Range Status   Specimen Description BLOOD LEFT ANTECUBITAL   Final   Special Requests BOTTLES DRAWN AEROBIC AND ANAEROBIC 10CC   Final   Culture  Setup Time     Final   Value: 06/08/2013 14:10     Performed at [facility]   Culture     Final   Value:        BLOOD CULTURE RECEIVED NO GROWTH TO DATE CULTURE WILL BE HELD FOR 5 DAYS BEFORE ISSUING A FINAL NEGATIVE REPORT     Performed at [facility]   Report Status PENDING   Incomplete  CULTURE, BLOOD (ROUTINE X 2)     Status: None   Collection Time    06/08/13 10:45 AM      Result Value Range Status   Specimen Description BLOOD RIGHT HAND   Final   Special Requests BOTTLES DRAWN AEROBIC ONLY 5CC   Final   Culture  Setup Time     Final   Value: 06/08/2013 14:10     Performed at [facility]    Culture     Final   Value:        BLOOD CULTURE RECEIVED NO GROWTH TO DATE CULTURE WILL BE HELD FOR 5 DAYS BEFORE ISSUING A FINAL NEGATIVE REPORT     Performed at [facility]   Report Status PENDING   Incomplete  MRSA PCR SCREENING     Status: Abnormal   Collection Time    06/08/13  5:47 PM      Result Value Range Status   MRSA by PCR POSITIVE (*) NEGATIVE Final   Comment:            The GeneXpert MRSA Assay (FDA     approved for NASAL specimens     only), is one component of  a     comprehensive MRSA colonization     surveillance program. It is not     intended to diagnose MRSA     infection nor to guide or     monitor treatment for     MRSA infections.     RESULT CALLED TO, READ BACK BY AND VERIFIED WITHIzetta Dakin RN [id number] 06/08/13 A BROWNING     Studies: Dg Chest 2 View  06/12/2013   CLINICAL DATA:  Pleural effusion.  Short of breath.  Weakness.  EXAM: CHEST  2 VIEW  COMPARISON:  DG CHEST 2 VIEW dated 06/10/2013; DG CHEST 1V PORT dated 06/08/2013; DG CHEST 1V PORT dated 06/08/2013  FINDINGS: Cardiopericardial silhouette is enlarged and partially obscured by airspace disease and effusion at the bases. Right upper extremity PICC is present with the tip in the mid SVC. Monitoring leads project over the chest. Aortic arch atherosclerosis. Moderate bilateral pleural effusions are present, with associated compressive atelectasis of the lower lobes. Mild pulmonary vascular congestion. Prominent skin fold projects over the right lateral chest.  IMPRESSION: 1. Unchanged cardiomegaly. 2. Moderate bilateral pleural effusions. No interval change from recent prior exams. 3. Unchanged right upper extremity PICC.   Electronically Signed   By: Andreas Newport M.D.   On: 06/12/2013 12:01   Dg Chest 2 View  06/10/2013   CLINICAL DATA:  Short of breath, bilateral pleural effusions  EXAM: CHEST  2 VIEW  COMPARISON:  Prior chest x-ray 06/08/2013  FINDINGS: Large right and moderate left  effusions. The right effusion is layering, there may be some mild loculation in the left base. No pneumothorax. Stable cardiomegaly. Aortic atherosclerosis. Incompletely imaged lumbar spine fixation hardware.  IMPRESSION: 1. Large layering right pleural effusion. 2. Moderate left pleural effusion with suggestion of mild basilar loculation. 3. Cardiomegaly and aortic atherosclerosis.   Electronically Signed   By: Malachy Moan M.D.   On: 06/10/2013 17:47    Scheduled Meds: . aspirin  81 mg Oral Daily  . atorvastatin  20 mg Oral QHS  . Chlorhexidine Gluconate Cloth  6 each Topical Q0600  . clonazePAM  0.5 mg Oral q morning - 10a  . clonazePAM  1 mg Oral QHS  . docusate sodium  100 mg Oral BID  . famotidine  20 mg Oral Daily  . furosemide  40 mg Intravenous TID  . insulin aspart  0-5 Units Subcutaneous QHS  . insulin aspart  0-9 Units Subcutaneous TID WC  . insulin detemir  7 Units Subcutaneous BID  . ipratropium-albuterol  3 mL Nebulization TID  . metoprolol tartrate  12.5 mg Oral BID  . mupirocin ointment  1 application Nasal BID  . senna-docusate  1 tablet Oral QHS  . traZODone  50 mg Oral QHS  . venlafaxine XR  75 mg Oral Daily  . warfarin  2 mg Oral ONCE-1800  . Warfarin - Pharmacist Dosing Inpatient   Does not apply q1800   Continuous Infusions:   Active Problems:   Recurrent pleural effusion on right   Paroxysmal atrial fibrillation   Dysarthria due to cerebrovascular accident   Hemiparesis affecting right side as late effect of cerebrovascular accident   GERD (gastroesophageal reflux disease)   Dementia   Acute respiratory failure with hypoxia   Acute diastolic heart failure   Anemia   Diabetes mellitus   CKD (chronic kidney disease), stage III   Hypotension   Chronic respiratory failure with hypoxia    Time spent:  35 minutes    Jeralyn BennettZAMORA, Pao Haffey  [facility] Pager [phone]. If 7PM-7AM, please contact night-coverage at [email or web address], password  [email or web address] 06/12/2013, 1:17 PM  LOS: 4 days

## 2013-06-13 LAB — GLUCOSE, CAPILLARY
GLUCOSE-CAPILLARY: 98 mg/dL (ref 70–99)
GLUCOSE-CAPILLARY: 146 mg/dL — AB (ref 70–99)
Glucose-Capillary: 96 mg/dL (ref 70–99)
Glucose-Capillary: 131 mg/dL — ABNORMAL HIGH (ref 70–99)

## 2013-06-13 LAB — BASIC METABOLIC PANEL
BUN: 30 mg/dL — AB (ref 6–23)
CHLORIDE: 104 meq/L (ref 96–112)
CO2: 24 mEq/L (ref 19–32)
CREATININE: 1.86 mg/dL — AB (ref 0.50–1.10)
Calcium: 7.9 mg/dL — ABNORMAL LOW (ref 8.4–10.5)
GFR calc Af Amer: 30 mL/min — ABNORMAL LOW (ref >90)
GFR calc non Af Amer: 26 mL/min — ABNORMAL LOW (ref >90)
GLUCOSE: 88 mg/dL (ref 70–99)
Potassium: 3.9 mEq/L (ref 3.7–5.3)
Sodium: 140 mEq/L (ref 137–147)

## 2013-06-13 LAB — PROTIME-INR
INR: 1.68 — ABNORMAL HIGH (ref 0.00–1.49)
Prothrombin Time: 19.3 seconds — ABNORMAL HIGH (ref 11.6–15.2)

## 2013-06-13 MED ORDER — WARFARIN SODIUM 2.5 MG PO TABS
2.5000 mg | ORAL_TABLET | Freq: Once | ORAL | Status: AC
  Administered 2013-06-13: 2.5 mg via ORAL
  Filled 2013-06-13: qty 1

## 2013-06-13 NOTE — Progress Notes (Signed)
ANTICOAGULATION CONSULT NOTE   Pharmacy Consult for warfarin Indication: atrial fibrillation  Allergies  Allergen Reactions  . Baclofen   . Clindamycin/Lincomycin     Patient Measurements: Height: 5\' 5"  (165.1 cm) Weight: 166 lb 9.6 oz (75.569 kg) IBW/kg (Calculated) : 57  Vital Signs: Temp: 98.5 F (36.9 C) (01/17 0433) Temp src: Oral (01/17 0433) BP: 130/88 mmHg (01/17 0433) Pulse Rate: 82 (01/17 0433)  Labs:  Recent Labs  06/11/13 0440 06/12/13 0442 06/13/13 0450  HGB  --  9.2*  --   HCT  --  30.5*  --   PLT  --  199  --   LABPROT 23.9* 19.6* 19.3*  INR 2.22* 1.71* 1.68*  CREATININE 1.88*  --  1.86*    Estimated Creatinine Clearance: 28.2 ml/min (by C-G formula based on Cr of 1.86).  Assessment: 72 YOF on warfarin for afib.  PTA dose 1.5 mg daily.  INR on admit 1.78, then was therapeutic but now has dropped again- likely d/t dose being d/c'd on 1/14 (no mention of why in notes) INR this morning 1.68. No bleeding noted. H/H low stable, platelets are stable. S/p right thoracentesis on 1/12 with 1.2L removed. Follow up plan for effusion.    Goal of Therapy:  INR 2-3 Monitor platelets by anticoagulation protocol: Yes   Plan:  1. warfarin 2.5mg  po x1 tonight 2. Daily PT/INR 3 Monitor for s/s of bleeding   Clayborn Milnes D. Derelle Cockrell, PharmD, BCPS Clinical Pharmacist Pager: [phone] 06/13/2013 12:04 PM

## 2013-06-13 NOTE — Progress Notes (Signed)
SUBJECTIVE:  She says that she is breathing OK.  No acute distress   PHYSICAL EXAM Filed Vitals:   06/12/13 1648 06/12/13 2212 06/13/13 0433 06/13/13 0913  BP:  108/73 130/88   Pulse:  79 82   Temp:  97.4 F (36.3 C) 98.5 F (36.9 C)   TempSrc:  Oral Oral   Resp:  18 20   Height:      Weight:   166 lb 9.6 oz (75.569 kg)   SpO2: 98% 100% 96% 98%   General:  No distress Lungs:  Decreased breath sounds bilaterally but improved Heart:  Irregular Abdomen:  Positive bowel sounds, no rebound no guarding Extremities:  No edema  LABS:  Results for orders placed during the hospital encounter of 06/08/13 (from the past 24 hour(s))  GLUCOSE, CAPILLARY     Status: Abnormal   Collection Time    06/12/13  4:22 PM      Result Value Range   Glucose-Capillary 108 (*) 70 - 99 mg/dL  GLUCOSE, CAPILLARY     Status: Abnormal   Collection Time    06/12/13 10:07 PM      Result Value Range   Glucose-Capillary 154 (*) 70 - 99 mg/dL  PROTIME-INR     Status: Abnormal   Collection Time    06/13/13  4:50 AM      Result Value Range   Prothrombin Time 19.3 (*) 11.6 - 15.2 seconds   INR 1.68 (*) 0.00 - 1.49  BASIC METABOLIC PANEL     Status: Abnormal   Collection Time    06/13/13  4:50 AM      Result Value Range   Sodium 140  137 - 147 mEq/L   Potassium 3.9  3.7 - 5.3 mEq/L   Chloride 104  96 - 112 mEq/L   CO2 24  19 - 32 mEq/L   Glucose, Bld 88  70 - 99 mg/dL   BUN 30 (*) 6 - 23 mg/dL   Creatinine, Ser 6.04 (*) 0.50 - 1.10 mg/dL   Calcium 7.9 (*) 8.4 - 10.5 mg/dL   GFR calc non Af Amer 26 (*) >90 mL/min   GFR calc Af Amer 30 (*) >90 mL/min  GLUCOSE, CAPILLARY     Status: None   Collection Time    06/13/13  6:44 AM      Result Value Range   Glucose-Capillary 96  70 - 99 mg/dL   Comment 1 Documented in Chart     Comment 2 Notify RN    GLUCOSE, CAPILLARY     Status: None   Collection Time    06/13/13 11:38 AM      Result Value Range   Glucose-Capillary 98  70 - 99 mg/dL   Comment  1 Documented in Chart     Comment 2 Notify RN      Intake/Output Summary (Last 24 hours) at 06/13/13 1321 Last data filed at 06/13/13 0900  Gross per 24 hour  Intake    360 ml  Output   1500 ml  Net  -1140 ml    ASSESSMENT AND PLAN:  ACUTE ON CHRONIC DIASTOLIC HF:  No plans for permanent indwelling pleural catheter at this point.   Continue current Lasix IV for another 48 hours most likely.  She is improved.   HYPOTENSION:  BP low but stable.  Precludes med titratioin.    PAROXYSMAL ATRIAL FIB:  Warfarin per pharmacy.  Atrial fib.  Might need to consider DCCV in the weeks  to come if this proves not to be paroxysmal and conservative therapy doesn't work.  Currently rate is controlled.   CKD:  Creat is slowly getting better   Rollene RotundaJames Silas Muff 06/13/2013 1:21 PM

## 2013-06-13 NOTE — Progress Notes (Signed)
NS discontinued

## 2013-06-13 NOTE — Progress Notes (Signed)
Received report at 1900; assumed care of patient.  Patient resting in bed with eyes closed, NAD.  Denies pain at this time.  NS infusing at 20 ml/hr.  Will continue to monitor.

## 2013-06-13 NOTE — Progress Notes (Signed)
Shift Summary:  No acute events this shift.  Afebrile; VSS.  Patient has denied pain this shift.  Some SOB reported with exertion but resolved with rest.  Patient has been repositioning self in bed this shift; activity tolerated well.  Will continue to monitor.

## 2013-06-13 NOTE — Progress Notes (Signed)
[facility] PROGRESS NOTE  Sudie Bandel ZOX:[id number] DOB: [birth year] DOA: 06/08/2013 PCP: Garlan Fillers, MD  Assessment/Plan: 1. Recurrent right-sided pleural effusions. Patient having multiple thoracentesis procedures over the last several months, undergoing thoracentesis during this hospitalization with the removal of 1.2 L of fluid. She has been seen and evaluated by CT surgery who recommended treating underlying cause of recurrent effusions as Pleurx catheter placement would likely be Unsatisfactory. She remains on Lasix 40 mg IV 3 times a day. Repeat chest x-ray on 06/12/13 showing moderate bilateral pleural effusions with no interval change since study on 06/10/2013. Dr Doreene Eland recommending continuing medical management for now.  2. Acute on chronic diastolic congestive heart failure. BNP elevated at 15,417. Last transthoracic echocardiogram was performed on 05/08/2013 which showed normal ejection fraction, with wall thickness increased in pattern of severe LVH. She remains on Lasix 40 mg IV 3 times a day. Cardiology following. 3. Paroxysmal age of fibrillation. Presently rate controlled, continue metoprolol 12.5 mg by mouth twice a day. Presently rate controlled. 4. Insulin-dependent diabetes mellitus. Hemoglobin A1c of 6.1 on 05/30/2013. Continue Accu-Cheks q. a.c. and each bedtime with sliding scale coverage. Continue insulin detemir.  5. Stage III chronic kidney disease. Her creatinine remains stable at 1.86. Will continue current dose of lasix.   Code Status: Full code Family Communication: Patient is daughter-in-law present at bedside and updated. Disposition Plan: Continue IV diuresis.   Consultants:  Cardiology  CT surgery  Procedures:  Ultrasound-guided thoracentesis   HPI/Subjective: Patient is a pleasant 73 year old female with a past medical history of chronic diastolic congestive heart failure, recurrent right sided pleural effusions requiring multiple  thoracentesis procedures in the past, admitted to the medicine service on 06/08/2013 when she presented with shortness of breath. Chest x-ray showed large right-sided pleural effusion. It appears that she had a right pleural drain catheter placed by interventional radiology on 04/20/2013, pulled at bedside on 04/26/2013 to 2 intolerance. Previous workup of fluid has been consistent with transudate of pleural effusion. She underwent thoracentesis with 1.2 L removal of fluid. Patient believes that she is feeling better this morning, and may be breathing easier. Repeat chest x-ray done on 06/12/2013 showing stable pleural effusions. Remains on Lasix 40 mg IV 3 times a day.  Objective: Filed Vitals:   06/13/13 0433  BP: 130/88  Pulse: 82  Temp: 98.5 F (36.9 C)  Resp: 20    Intake/Output Summary (Last 24 hours) at 06/13/13 1224 Last data filed at 06/13/13 0900  Gross per 24 hour  Intake    480 ml  Output   1900 ml  Net  -1420 ml   Filed Weights   06/11/13 0501 06/12/13 0703 06/13/13 0433  Weight: 82.237 kg (181 lb 4.8 oz) 75.978 kg (167 lb 8 oz) 75.569 kg (166 lb 9.6 oz)    Exam:   General:  Patient is in no acute distress, feels that she is breathing easier this morning.  Cardiovascular: Normal S1-S2 no murmurs rubs or gallops  Respiratory: Patient having by basilar crackles with diminished breath sounds at bases. On supplemental oxygen, does not appear to be in acute respiratory distress.  Abdomen: Soft nontender nondistended  Musculoskeletal: Present range of motion to all extremities  Data Reviewed: Basic Metabolic Panel:  Recent Labs Lab 06/08/13 0857 06/08/13 1900 06/09/13 0300 06/10/13 0225 06/11/13 0440 06/13/13 0450  NA 140  --  139 141 141 140  K 4.7  --  4.6 4.0 3.8 3.9  CL 107  --  105 106  105 104  CO2  --   --  21 23 24 24   GLUCOSE 132*  --  134* 75 72 88  BUN 30*  --  32* 33* 28* 30*  CREATININE 1.90* 1.93* 1.99* 2.13* 1.88* 1.86*  CALCIUM  --   --   8.5 8.5 8.4 7.9*   Liver Function Tests: No results found for this basename: AST, ALT, ALKPHOS, BILITOT, PROT, ALBUMIN,  in the last 168 hours No results found for this basename: LIPASE, AMYLASE,  in the last 168 hours No results found for this basename: AMMONIA,  in the last 168 hours CBC:  Recent Labs Lab 06/08/13 0842 06/08/13 0857 06/08/13 1900 06/09/13 0300 06/10/13 0225 06/12/13 0442  WBC 7.8  --  4.9 6.6 8.9 5.4  NEUTROABS 5.8  --   --   --   --   --   HGB 10.1* 12.6 9.7* 9.0* 9.0* 9.2*  HCT 32.4* 37.0 31.5* 29.1* 28.7* 30.5*  MCV 81.8  --  83.1 81.5 81.5 84.0  PLT 216  --  183 174 210 199   Cardiac Enzymes:  Recent Labs Lab 06/08/13 1035 06/08/13 1900 06/09/13 0231 06/09/13 0614  TROPONINI <0.30 <0.30 <0.30 <0.30   BNP (last 3 results)  Recent Labs  05/08/13 1054 06/08/13 0842 06/11/13 0440  PROBNP 17420.0* 21847.0* 15417.0*   CBG:  Recent Labs Lab 06/12/13 1137 06/12/13 1622 06/12/13 2207 06/13/13 0644 06/13/13 1138  GLUCAP 133* 108* 154* 96 98    Recent Results (from the past 240 hour(s))  CULTURE, BLOOD (ROUTINE X 2)     Status: None   Collection Time    06/08/13 10:35 AM      Result Value Range Status   Specimen Description BLOOD LEFT ANTECUBITAL   Final   Special Requests BOTTLES DRAWN AEROBIC AND ANAEROBIC 10CC   Final   Culture  Setup Time     Final   Value: 06/08/2013 14:10     Performed at [facility]   Culture     Final   Value:        BLOOD CULTURE RECEIVED NO GROWTH TO DATE CULTURE WILL BE HELD FOR 5 DAYS BEFORE ISSUING A FINAL NEGATIVE REPORT     Performed at [facility]   Report Status PENDING   Incomplete  CULTURE, BLOOD (ROUTINE X 2)     Status: None   Collection Time    06/08/13 10:45 AM      Result Value Range Status   Specimen Description BLOOD RIGHT HAND   Final   Special Requests BOTTLES DRAWN AEROBIC ONLY 5CC   Final   Culture  Setup Time     Final   Value: 06/08/2013 14:10     Performed at  [facility]   Culture     Final   Value:        BLOOD CULTURE RECEIVED NO GROWTH TO DATE CULTURE WILL BE HELD FOR 5 DAYS BEFORE ISSUING A FINAL NEGATIVE REPORT     Performed at [facility]   Report Status PENDING   Incomplete  MRSA PCR SCREENING     Status: Abnormal   Collection Time    06/08/13  5:47 PM      Result Value Range Status   MRSA by PCR POSITIVE (*) NEGATIVE Final   Comment:            The GeneXpert MRSA Assay (FDA     approved for NASAL specimens  only), is one component of a     comprehensive MRSA colonization     surveillance program. It is not     intended to diagnose MRSA     infection nor to guide or     monitor treatment for     MRSA infections.     RESULT CALLED TO, READ BACK BY AND VERIFIED WITHIzetta Dakin RN [id number] 06/08/13 A BROWNING     Studies: Dg Chest 2 View  06/12/2013   CLINICAL DATA:  Pleural effusion.  Short of breath.  Weakness.  EXAM: CHEST  2 VIEW  COMPARISON:  DG CHEST 2 VIEW dated 06/10/2013; DG CHEST 1V PORT dated 06/08/2013; DG CHEST 1V PORT dated 06/08/2013  FINDINGS: Cardiopericardial silhouette is enlarged and partially obscured by airspace disease and effusion at the bases. Right upper extremity PICC is present with the tip in the mid SVC. Monitoring leads project over the chest. Aortic arch atherosclerosis. Moderate bilateral pleural effusions are present, with associated compressive atelectasis of the lower lobes. Mild pulmonary vascular congestion. Prominent skin fold projects over the right lateral chest.  IMPRESSION: 1. Unchanged cardiomegaly. 2. Moderate bilateral pleural effusions. No interval change from recent prior exams. 3. Unchanged right upper extremity PICC.   Electronically Signed   By: Andreas Newport M.D.   On: 06/12/2013 12:01    Scheduled Meds: . aspirin  81 mg Oral Daily  . atorvastatin  20 mg Oral QHS  . clonazePAM  0.5 mg Oral q morning - 10a  . clonazePAM  1 mg Oral QHS  . docusate sodium  100 mg  Oral BID  . famotidine  20 mg Oral Daily  . furosemide  40 mg Intravenous TID  . insulin aspart  0-5 Units Subcutaneous QHS  . insulin aspart  0-9 Units Subcutaneous TID WC  . insulin detemir  7 Units Subcutaneous BID  . ipratropium-albuterol  3 mL Nebulization TID  . metoprolol tartrate  12.5 mg Oral BID  . senna-docusate  1 tablet Oral QHS  . traZODone  50 mg Oral QHS  . venlafaxine XR  75 mg Oral Daily  . warfarin  2.5 mg Oral ONCE-1800  . Warfarin - Pharmacist Dosing Inpatient   Does not apply q1800   Continuous Infusions:   Active Problems:   Recurrent pleural effusion on right   Paroxysmal atrial fibrillation   Dysarthria due to cerebrovascular accident   Hemiparesis affecting right side as late effect of cerebrovascular accident   GERD (gastroesophageal reflux disease)   Dementia   Acute respiratory failure with hypoxia   Acute diastolic heart failure   Anemia   Diabetes mellitus   CKD (chronic kidney disease), stage III   Hypotension   Chronic respiratory failure with hypoxia    Time spent: 35 minutes    Jeralyn Bennett  [facility] Pager [phone]. If 7PM-7AM, please contact night-coverage at [email or web address], password [facility] 06/13/2013, 12:24 PM  LOS: 5 days

## 2013-06-14 ENCOUNTER — Inpatient Hospital Stay (HOSPITAL_COMMUNITY): Payer: Medicare Other

## 2013-06-14 LAB — CULTURE, BLOOD (ROUTINE X 2)
CULTURE: NO GROWTH
Culture: NO GROWTH

## 2013-06-14 LAB — BASIC METABOLIC PANEL
BUN: 30 mg/dL — AB (ref 6–23)
CHLORIDE: 101 meq/L (ref 96–112)
CO2: 26 meq/L (ref 19–32)
CREATININE: 1.88 mg/dL — AB (ref 0.50–1.10)
Calcium: 8.2 mg/dL — ABNORMAL LOW (ref 8.4–10.5)
GFR calc Af Amer: 30 mL/min — ABNORMAL LOW (ref >90)
GFR calc non Af Amer: 26 mL/min — ABNORMAL LOW (ref >90)
GLUCOSE: 94 mg/dL (ref 70–99)
POTASSIUM: 3.6 meq/L — AB (ref 3.7–5.3)
Sodium: 141 mEq/L (ref 137–147)

## 2013-06-14 LAB — GLUCOSE, CAPILLARY
GLUCOSE-CAPILLARY: 101 mg/dL — AB (ref 70–99)
GLUCOSE-CAPILLARY: 123 mg/dL — AB (ref 70–99)
Glucose-Capillary: 91 mg/dL (ref 70–99)
Glucose-Capillary: 107 mg/dL — ABNORMAL HIGH (ref 70–99)

## 2013-06-14 LAB — PROTIME-INR
INR: 1.6 — ABNORMAL HIGH (ref 0.00–1.49)
PROTHROMBIN TIME: 18.6 s — AB (ref 11.6–15.2)

## 2013-06-14 MED ORDER — WARFARIN SODIUM 5 MG PO TABS
5.0000 mg | ORAL_TABLET | Freq: Once | ORAL | Status: AC
  Administered 2013-06-14: 5 mg via ORAL
  Filled 2013-06-14: qty 1

## 2013-06-14 MED ORDER — OXYCODONE HCL 5 MG PO TABS
5.0000 mg | ORAL_TABLET | Freq: Four times a day (QID) | ORAL | Status: DC | PRN
  Administered 2013-06-14 – 2013-06-17 (×4): 5 mg via ORAL
  Filled 2013-06-14 (×4): qty 1

## 2013-06-14 NOTE — Progress Notes (Signed)
Physical Therapy Treatment Patient Details Name: Ariana AmatoShirley [name] MRN: [id number] DOB: [birth year] Today's Date: 06/14/2013 Time: [id number] PT Time Calculation (min): 15 min  PT Assessment / Plan / Recommendation  History of Present Illness     PT Comments   Increased assist required for transfer.  May be due to transfering toward left instead of to the right.  PT to continue per POC.  Follow Up Recommendations  SNF     Does the patient have the potential to tolerate intense rehabilitation     Barriers to Discharge        Equipment Recommendations  None recommended by PT    Recommendations for Other Services    Frequency Min 2X/week   Progress towards PT Goals Progress towards PT goals: Progressing toward goals  Plan Current plan remains appropriate    Precautions / Restrictions Precautions Precautions: Fall Precaution Comments: bilat inverted ankles   Pertinent Vitals/Pain 0/10    Mobility  Bed Mobility Overal bed mobility: Modified Independent Transfers Overall transfer level: Needs assistance Sit to Stand: Mod assist Squat pivot transfers: Max assist General transfer comment: transfered toward left bed to recliner    Exercises     PT Diagnosis:    PT Problem List:   PT Treatment Interventions:     PT Goals (current goals can now be found in the care plan section)    Visit Information  Last PT Received On: 06/14/13 Assistance Needed: +1    Subjective Data      Cognition  Cognition Arousal/Alertness: Awake/alert Behavior During Therapy: Ariana [name] Overall Cognitive Status: Difficult to assess Difficult to assess due to: Impaired communication (expressive aphasia)    Balance     End of Session PT - End of Session Equipment Utilized During Treatment: Gait belt Activity Tolerance: Patient tolerated treatment well Patient left: in chair;with call bell/phone within reach;with chair alarm set Nurse Communication: Precautions    GP     Ariana [name], Ariana [name] 06/14/2013, 9:03 AM  Aida RaiderWendy Kiara [name], PT  Office # [phone] Pager [phone]

## 2013-06-14 NOTE — Progress Notes (Signed)
   SUBJECTIVE:  She says that she is breathing OK.  No acute distress   PHYSICAL EXAM Filed Vitals:   06/13/13 0913 06/13/13 1425 06/13/13 1937 06/14/13 0520  BP:  111/79 102/60 106/51  Pulse:   55 66  Temp:  98.2 F (36.8 C) 98.1 F (36.7 C) 98.1 F (36.7 C)  TempSrc:  Oral Oral Oral  Resp:  20 20 18   Height:      Weight:    169 lb (76.658 kg)  SpO2: 98% 100% 100% 97%   General:  No distress Lungs:  Decreased breath sounds bilaterally but improved Heart:  Irregular Abdomen:  Positive bowel sounds, no rebound no guarding Extremities:  No edema  LABS:  Results for orders placed during the hospital encounter of 06/08/13 (from the past 24 hour(s))  GLUCOSE, CAPILLARY     Status: Abnormal   Collection Time    06/13/13  4:14 PM      Result Value Range   Glucose-Capillary 146 (*) 70 - 99 mg/dL   Comment 1 Documented in Chart     Comment 2 Notify RN    GLUCOSE, CAPILLARY     Status: Abnormal   Collection Time    06/13/13 10:06 PM      Result Value Range   Glucose-Capillary 131 (*) 70 - 99 mg/dL  PROTIME-INR     Status: Abnormal   Collection Time    06/14/13  4:25 AM      Result Value Range   Prothrombin Time 18.6 (*) 11.6 - 15.2 seconds   INR 1.60 (*) 0.00 - 1.49  BASIC METABOLIC PANEL     Status: Abnormal   Collection Time    06/14/13  4:25 AM      Result Value Range   Sodium 141  137 - 147 mEq/L   Potassium 3.6 (*) 3.7 - 5.3 mEq/L   Chloride 101  96 - 112 mEq/L   CO2 26  19 - 32 mEq/L   Glucose, Bld 94  70 - 99 mg/dL   BUN 30 (*) 6 - 23 mg/dL   Creatinine, Ser 1.611.88 (*) 0.50 - 1.10 mg/dL   Calcium 8.2 (*) 8.4 - 10.5 mg/dL   GFR calc non Af Amer 26 (*) >90 mL/min   GFR calc Af Amer 30 (*) >90 mL/min  GLUCOSE, CAPILLARY     Status: None   Collection Time    06/14/13  6:25 AM      Result Value Range   Glucose-Capillary 91  70 - 99 mg/dL    Intake/Output Summary (Last 24 hours) at 06/14/13 1140 Last data filed at 06/14/13 0930  Gross per 24 hour  Intake    1180 ml  Output   2450 ml  Net  -1270 ml    ASSESSMENT AND PLAN:  ACUTE ON CHRONIC DIASTOLIC HF:  No plans for permanent indwelling pleural catheter at this point.   Continue current Lasix IV today.  She is improved.  Repeat portable CXR in AM.   HYPOTENSION:  BP low but stable.  Precludes med titratioin.    PAROXYSMAL ATRIAL FIB:  Warfarin per pharmacy.  Atrial fib.  Might need to consider DCCV in the weeks to come if this proves not to be paroxysmal and conservative therapy doesn't work.  Currently rate is controlled.   CKD:  Creat is stable today.   Fayrene FearingJames [facility] 06/14/2013 11:40 AM

## 2013-06-14 NOTE — Progress Notes (Signed)
[facility] PROGRESS NOTE  Ariana [name] UYQ:[id number] DOB: October 28, 1940 DOA: 06/08/2013 PCP: Garlan Fillers, MD  Assessment/Plan: 1. Recurrent right-sided pleural effusions. Patient having multiple thoracentesis procedures over the last several months, undergoing thoracentesis during this hospitalization with the removal of 1.2 L of fluid. She has been seen and evaluated by CT surgery who recommended treating underlying cause of recurrent effusions as Pleurx catheter placement would likely be Unsatisfactory. She remains on Lasix 40 mg IV 3 times a day. Repeat chest x-ray on 06/12/13 showing moderate bilateral pleural effusions with no interval change since study on 06/10/2013. Plan to continue Lasix for the next 24 hours. 2. Acute on chronic diastolic congestive heart failure. BNP elevated at 15,417. Last transthoracic echocardiogram was performed on 05/08/2013 which showed normal ejection fraction, with wall thickness increased in pattern of severe LVH. She remains on Lasix 40 mg IV 3 times a day. Plan to continue Lasix for the next 24 hours, hopefully can be transitioned to oral Lasix and discharged soon. 3. Paroxysmal age of fibrillation. Presently rate controlled, continue metoprolol 12.5 mg by mouth twice a day.  4. Insulin-dependent diabetes mellitus. Hemoglobin A1c of 6.1 on 05/30/2013. Continue Accu-Cheks q. a.c. and each bedtime with sliding scale coverage. Continue insulin detemir.  5. Stage III chronic kidney disease. Her creatinine remains stable at 1.8. Will continue current dose of lasix.  6. Anticoagulation. Pharmacy consulted for warfarin management. INR remains subtherapeutic at 1.6. Await further recommendations from pharmacy regarding warfarin dosing, will increase warfarin dose this evening.   Code Status: Full code Family Communication: Patient is daughter-in-law present at bedside and updated. Disposition Plan: Hopefully can transition to oral lasix tomorrow and  discharge to SNF on Mon or Tues.    Consultants:  Cardiology  CT surgery  Procedures:  Ultrasound-guided thoracentesis   HPI/Subjective: Patient is a pleasant 73 year old female with a past medical history of chronic diastolic congestive heart failure, recurrent right sided pleural effusions requiring multiple thoracentesis procedures in the past, admitted to the medicine service on 06/08/2013 when she presented with shortness of breath. Chest x-ray showed large right-sided pleural effusion. It appears that she had a right pleural drain catheter placed by interventional radiology on 04/20/2013, pulled at bedside on 04/26/2013 to 2 intolerance. Previous workup of fluid has been consistent with transudate of pleural effusion. She underwent thoracentesis with 1.2 L removal of fluid. Patient believes that she is feeling better this morning, and may be breathing easier. Repeat chest x-ray done on 06/12/2013 showing stable pleural effusions. Remains on Lasix 40 mg IV 3 times a day.  Objective: Filed Vitals:   06/14/13 0520  BP: 106/51  Pulse: 66  Temp: 98.1 F (36.7 C)  Resp: 18    Intake/Output Summary (Last 24 hours) at 06/14/13 1108 Last data filed at 06/14/13 0930  Gross per 24 hour  Intake   1180 ml  Output   2450 ml  Net  -1270 ml   Filed Weights   06/12/13 0703 06/13/13 0433 06/14/13 0520  Weight: 75.978 kg (167 lb 8 oz) 75.569 kg (166 lb 9.6 oz) 76.658 kg (169 lb)    Exam:   General:  Patient is in no acute distress, feels that she is breathing easier this morning.  Cardiovascular: Normal S1-S2 no murmurs rubs or gallops  Respiratory: Patient having by basilar crackles with diminished breath sounds at bases. On supplemental oxygen, does not appear to be in acute respiratory distress.  Abdomen: Soft nontender nondistended  Musculoskeletal: Present range of motion to  all extremities  Data Reviewed: Basic Metabolic Panel:  Recent Labs Lab 06/09/13 0300  06/10/13 0225 06/11/13 0440 06/13/13 0450 06/14/13 0425  NA 139 141 141 140 141  K 4.6 4.0 3.8 3.9 3.6*  CL 105 106 105 104 101  CO2 21 23 24 24 26   GLUCOSE 134* 75 72 88 94  BUN 32* 33* 28* 30* 30*  CREATININE 1.99* 2.13* 1.88* 1.86* 1.88*  CALCIUM 8.5 8.5 8.4 7.9* 8.2*   Liver Function Tests: No results found for this basename: AST, ALT, ALKPHOS, BILITOT, PROT, ALBUMIN,  in the last 168 hours No results found for this basename: LIPASE, AMYLASE,  in the last 168 hours No results found for this basename: AMMONIA,  in the last 168 hours CBC:  Recent Labs Lab 06/08/13 0842 06/08/13 0857 06/08/13 1900 06/09/13 0300 06/10/13 0225 06/12/13 0442  WBC 7.8  --  4.9 6.6 8.9 5.4  NEUTROABS 5.8  --   --   --   --   --   HGB 10.1* 12.6 9.7* 9.0* 9.0* 9.2*  HCT 32.4* 37.0 31.5* 29.1* 28.7* 30.5*  MCV 81.8  --  83.1 81.5 81.5 84.0  PLT 216  --  183 174 210 199   Cardiac Enzymes:  Recent Labs Lab 06/08/13 1035 06/08/13 1900 06/09/13 0231 06/09/13 0614  TROPONINI <0.30 <0.30 <0.30 <0.30   BNP (last 3 results)  Recent Labs  05/08/13 1054 06/08/13 0842 06/11/13 0440  PROBNP 17420.0* 21847.0* 15417.0*   CBG:  Recent Labs Lab 06/13/13 0644 06/13/13 1138 06/13/13 1614 06/13/13 2206 06/14/13 0625  GLUCAP 96 98 146* 131* 91    Recent Results (from the past 240 hour(s))  CULTURE, BLOOD (ROUTINE X 2)     Status: None   Collection Time    06/08/13 10:35 AM      Result Value Range Status   Specimen Description BLOOD LEFT ANTECUBITAL   Final   Special Requests BOTTLES DRAWN AEROBIC AND ANAEROBIC 10CC   Final   Culture  Setup Time     Final   Value: 06/08/2013 14:10     Performed at [facility]   Culture     Final   Value: NO GROWTH 5 DAYS     Performed at [facility]   Report Status 06/14/2013 FINAL   Final  CULTURE, BLOOD (ROUTINE X 2)     Status: None   Collection Time    06/08/13 10:45 AM      Result Value Range Status   Specimen  Description BLOOD RIGHT HAND   Final   Special Requests BOTTLES DRAWN AEROBIC ONLY 5CC   Final   Culture  Setup Time     Final   Value: 06/08/2013 14:10     Performed at [facility]   Culture     Final   Value: NO GROWTH 5 DAYS     Performed at [facility]   Report Status 06/14/2013 FINAL   Final  MRSA PCR SCREENING     Status: Abnormal   Collection Time    06/08/13  5:47 PM      Result Value Range Status   MRSA by PCR POSITIVE (*) NEGATIVE Final   Comment:            The GeneXpert MRSA Assay (FDA     approved for NASAL specimens     only), is one component of a     comprehensive MRSA colonization     surveillance program.  It is not     intended to diagnose MRSA     infection nor to guide or     monitor treatment for     MRSA infections.     RESULT CALLED TO, READ BACK BY AND VERIFIED WITHIzetta Dakin RN 2219 06/08/13 A BROWNING     Studies: No results found.  Scheduled Meds: . aspirin  81 mg Oral Daily  . atorvastatin  20 mg Oral QHS  . clonazePAM  0.5 mg Oral q morning - 10a  . clonazePAM  1 mg Oral QHS  . docusate sodium  100 mg Oral BID  . famotidine  20 mg Oral Daily  . furosemide  40 mg Intravenous TID  . insulin aspart  0-5 Units Subcutaneous QHS  . insulin aspart  0-9 Units Subcutaneous TID WC  . insulin detemir  7 Units Subcutaneous BID  . ipratropium-albuterol  3 mL Nebulization TID  . metoprolol tartrate  12.5 mg Oral BID  . senna-docusate  1 tablet Oral QHS  . traZODone  50 mg Oral QHS  . venlafaxine XR  75 mg Oral Daily  . Warfarin - Pharmacist Dosing Inpatient   Does not apply q1800   Continuous Infusions:   Active Problems:   Recurrent pleural effusion on right   Paroxysmal atrial fibrillation   Dysarthria due to cerebrovascular accident   Hemiparesis affecting right side as late effect of cerebrovascular accident   GERD (gastroesophageal reflux disease)   Dementia   Acute respiratory failure with hypoxia   Acute diastolic  heart failure   Anemia   Diabetes mellitus   CKD (chronic kidney disease), stage III   Hypotension   Chronic respiratory failure with hypoxia    Time spent: 35 minutes    Jeralyn Bennett  [facility] Pager [phone]. If 7PM-7AM, please contact night-coverage at [email or web address], password [facility] 06/14/2013, 11:08 AM  LOS: 6 days

## 2013-06-14 NOTE — Progress Notes (Signed)
ANTICOAGULATION CONSULT NOTE   Pharmacy Consult for warfarin Indication: atrial fibrillation  Allergies  Allergen Reactions  . Baclofen   . Clindamycin/Lincomycin     Patient Measurements: Height: 5\' 5"  (165.1 cm) Weight: 169 lb (76.658 kg) IBW/kg (Calculated) : 57  Vital Signs: Temp: 98.1 F (36.7 C) (01/18 0520) Temp src: Oral (01/18 0520) BP: 106/51 mmHg (01/18 0520) Pulse Rate: 66 (01/18 0520)  Labs:  Recent Labs  06/12/13 0442 06/13/13 0450 06/14/13 0425  HGB 9.2*  --   --   HCT 30.5*  --   --   PLT 199  --   --   LABPROT 19.6* 19.3* 18.6*  INR 1.71* 1.68* 1.60*  CREATININE  --  1.86* 1.88*    Estimated Creatinine Clearance: 28.1 ml/min (by C-G formula based on Cr of 1.88).  Assessment: 72 YOF on warfarin for afib.  PTA dose 1.5 mg daily.  INR on admit 1.78, then was therapeutic but now has dropped. Not patient did receive a couple of doses of levofloxacin which can potentiate INR. There was also a warfarin order d/c'd on 1/14 with no mention of why in notes. The discontinuation of levofloxacin and the missed dose are likely the reasons behind the INR drop. INR this morning 1.6. CBC is stable and no bleeding noted.  Goal of Therapy:  INR 2-3 Monitor platelets by anticoagulation protocol: Yes   Plan:  1. warfarin 5mg  po x1 tonight 2. Daily PT/INR 3 Monitor for s/s of bleeding   Vanellope Passmore D. Daleisa Halperin, PharmD, BCPS Clinical Pharmacist Pager: [phone] 06/14/2013 11:15 AM

## 2013-06-15 LAB — CBC
HCT: 29 % — ABNORMAL LOW (ref 36.0–46.0)
HEMOGLOBIN: 9.1 g/dL — AB (ref 12.0–15.0)
MCH: 25.9 pg — AB (ref 26.0–34.0)
MCHC: 31.4 g/dL (ref 30.0–36.0)
MCV: 82.6 fL (ref 78.0–100.0)
PLATELETS: 199 10*3/uL (ref 150–400)
RBC: 3.51 MIL/uL — ABNORMAL LOW (ref 3.87–5.11)
RDW: 17.5 % — ABNORMAL HIGH (ref 11.5–15.5)
WBC: 6.2 10*3/uL (ref 4.0–10.5)

## 2013-06-15 LAB — GLUCOSE, CAPILLARY
Glucose-Capillary: 115 mg/dL — ABNORMAL HIGH (ref 70–99)
Glucose-Capillary: 167 mg/dL — ABNORMAL HIGH (ref 70–99)
Glucose-Capillary: 115 mg/dL — ABNORMAL HIGH (ref 70–99)
Glucose-Capillary: 122 mg/dL — ABNORMAL HIGH (ref 70–99)

## 2013-06-15 LAB — BASIC METABOLIC PANEL
BUN: 32 mg/dL — AB (ref 6–23)
CALCIUM: 8 mg/dL — AB (ref 8.4–10.5)
CO2: 26 mEq/L (ref 19–32)
Chloride: 102 mEq/L (ref 96–112)
Creatinine, Ser: 1.93 mg/dL — ABNORMAL HIGH (ref 0.50–1.10)
GFR calc Af Amer: 29 mL/min — ABNORMAL LOW (ref >90)
GFR, EST NON AFRICAN AMERICAN: 25 mL/min — AB (ref >90)
GLUCOSE: 135 mg/dL — AB (ref 70–99)
Potassium: 3.6 mEq/L — ABNORMAL LOW (ref 3.7–5.3)
Sodium: 142 mEq/L (ref 137–147)

## 2013-06-15 LAB — PRO B NATRIURETIC PEPTIDE: Pro B Natriuretic peptide (BNP): 7917 pg/mL — ABNORMAL HIGH (ref 0–125)

## 2013-06-15 LAB — PROTIME-INR
INR: 1.67 — ABNORMAL HIGH (ref 0.00–1.49)
Prothrombin Time: 19.2 seconds — ABNORMAL HIGH (ref 11.6–15.2)

## 2013-06-15 MED ORDER — POTASSIUM CHLORIDE CRYS ER 20 MEQ PO TBCR
40.0000 meq | EXTENDED_RELEASE_TABLET | Freq: Once | ORAL | Status: AC
  Administered 2013-06-15: 40 meq via ORAL
  Filled 2013-06-15: qty 2

## 2013-06-15 MED ORDER — WARFARIN SODIUM 5 MG PO TABS
5.0000 mg | ORAL_TABLET | Freq: Once | ORAL | Status: AC
  Administered 2013-06-15: 5 mg via ORAL
  Filled 2013-06-15: qty 1

## 2013-06-15 MED ORDER — FUROSEMIDE 10 MG/ML IJ SOLN
40.0000 mg | Freq: Two times a day (BID) | INTRAMUSCULAR | Status: DC
  Administered 2013-06-15: 40 mg via INTRAVENOUS
  Filled 2013-06-15 (×3): qty 4

## 2013-06-15 NOTE — Progress Notes (Signed)
CSW updated [facility] facility to let them know that dc is not today, possibly tomorrow.  Maree KrabbeLindsay Daneesha Quinteros, MSW, Theresia MajorsLCSWA [phone]

## 2013-06-15 NOTE — Progress Notes (Signed)
[facility] PROGRESS NOTE  Shandra Szymborski ZOX:[id number] DOB: 06-26-1940 DOA: 06/08/2013 PCP: Garlan Fillers, MD  Assessment/Plan: 1. Recurrent right-sided pleural effusions. Patient having multiple thoracentesis procedures over the last several months, undergoing thoracentesis during this hospitalization with the removal of 1.2 L of fluid. She has been seen and evaluated by CT surgery who recommended treating underlying cause of recurrent effusions as Pleurx catheter placement would likely be Unsatisfactory. Continue lasix, repeat CXR in AM.  2. Acute on chronic diastolic congestive heart failure. BNP elevated at 15,417. Last transthoracic echocardiogram was performed on 05/08/2013 which showed normal ejection fraction, with wall thickness increased in pattern of severe LVH. Creatinine trended up to 1.93, Lasix dose be decreased to 40 mg IV twice a day, hopefully can be transitioned to oral Lasix and discharged soon. 3. Paroxysmal age of fibrillation. Presently rate controlled, continue metoprolol 12.5 mg by mouth twice a day.  4. Insulin-dependent diabetes mellitus. Hemoglobin A1c of 6.1 on 05/30/2013. Continue Accu-Cheks q. a.c. and each bedtime with sliding scale coverage. Continue insulin detemir.  5. Stage III chronic kidney disease. Creatinine a little higher at 1.9, decreasing lasix dose.  6. Anticoagulation. Pharmacy consulted for warfarin management. INR remains subtherapeutic at 1.6. Await further recommendations from pharmacy regarding warfarin dosing, will increase warfarin dose this evening.   Code Status: Full code Family Communication: Patient is daughter-in-law present at bedside and updated. Disposition Plan: Hopefully can transition to oral lasix tomorrow and discharge to SNF on Mon or Tues.    Consultants:  Cardiology  CT surgery  Procedures:  Ultrasound-guided thoracentesis   HPI/Subjective: Patient is a pleasant 73 year old female with a past medical  history of chronic diastolic congestive heart failure, recurrent right sided pleural effusions requiring multiple thoracentesis procedures in the past, admitted to the medicine service on 06/08/2013 when she presented with shortness of breath. Chest x-ray showed large right-sided pleural effusion. It appears that she had a right pleural drain catheter placed by interventional radiology on 04/20/2013, pulled at bedside on 04/26/2013 to 2 intolerance. Previous workup of fluid has been consistent with transudate of pleural effusion. She underwent thoracentesis with 1.2 L removal of fluid. Patient believes that she is feeling better this morning, and may be breathing easier. Repeat chest x-ray done on 06/12/2013 showing stable pleural effusions.   Objective: Filed Vitals:   06/15/13 0421  BP: 107/63  Pulse: 58  Temp: 98.4 F (36.9 C)  Resp: 18    Intake/Output Summary (Last 24 hours) at 06/15/13 1451 Last data filed at 06/15/13 0800  Gross per 24 hour  Intake    240 ml  Output   1200 ml  Net   -960 ml   Filed Weights   06/13/13 0433 06/14/13 0520 06/15/13 0500  Weight: 75.569 kg (166 lb 9.6 oz) 76.658 kg (169 lb) 76.105 kg (167 lb 12.5 oz)    Exam:   General:  Patient is in no acute distress, feels that she is breathing easier this morning.  Cardiovascular: Normal S1-S2 no murmurs rubs or gallops  Respiratory: Patient having by basilar crackles with diminished breath sounds at bases. On supplemental oxygen, does not appear to be in acute respiratory distress.  Abdomen: Soft nontender nondistended  Musculoskeletal: Present range of motion to all extremities  Data Reviewed: Basic Metabolic Panel:  Recent Labs Lab 06/10/13 0225 06/11/13 0440 06/13/13 0450 06/14/13 0425 06/15/13 0435  NA 141 141 140 141 142  K 4.0 3.8 3.9 3.6* 3.6*  CL 106 105 104 101 102  CO2  23 24 24 26 26   GLUCOSE 75 72 88 94 135*  BUN 33* 28* 30* 30* 32*  CREATININE 2.13* 1.88* 1.86* 1.88* 1.93*   CALCIUM 8.5 8.4 7.9* 8.2* 8.0*   Liver Function Tests: No results found for this basename: AST, ALT, ALKPHOS, BILITOT, PROT, ALBUMIN,  in the last 168 hours No results found for this basename: LIPASE, AMYLASE,  in the last 168 hours No results found for this basename: AMMONIA,  in the last 168 hours CBC:  Recent Labs Lab 06/08/13 1900 06/09/13 0300 06/10/13 0225 06/12/13 0442 06/15/13 0435  WBC 4.9 6.6 8.9 5.4 6.2  HGB 9.7* 9.0* 9.0* 9.2* 9.1*  HCT 31.5* 29.1* 28.7* 30.5* 29.0*  MCV 83.1 81.5 81.5 84.0 82.6  PLT 183 174 210 199 199   Cardiac Enzymes:  Recent Labs Lab 06/08/13 1900 06/09/13 0231 06/09/13 0614  TROPONINI <0.30 <0.30 <0.30   BNP (last 3 results)  Recent Labs  06/08/13 0842 06/11/13 0440 06/15/13 0435  PROBNP 21847.0* 15417.0* 7917.0*   CBG:  Recent Labs Lab 06/14/13 1215 06/14/13 1618 06/14/13 2113 06/15/13 0611 06/15/13 1121  GLUCAP 107* 101* 123* 115* 167*    Recent Results (from the past 240 hour(s))  CULTURE, BLOOD (ROUTINE X 2)     Status: None   Collection Time    06/08/13 10:35 AM      Result Value Range Status   Specimen Description BLOOD LEFT ANTECUBITAL   Final   Special Requests BOTTLES DRAWN AEROBIC AND ANAEROBIC 10CC   Final   Culture  Setup Time     Final   Value: 06/08/2013 14:10     Performed at [facility]   Culture     Final   Value: NO GROWTH 5 DAYS     Performed at [facility]   Report Status 06/14/2013 FINAL   Final  CULTURE, BLOOD (ROUTINE X 2)     Status: None   Collection Time    06/08/13 10:45 AM      Result Value Range Status   Specimen Description BLOOD RIGHT HAND   Final   Special Requests BOTTLES DRAWN AEROBIC ONLY 5CC   Final   Culture  Setup Time     Final   Value: 06/08/2013 14:10     Performed at [facility]   Culture     Final   Value: NO GROWTH 5 DAYS     Performed at [facility]   Report Status 06/14/2013 FINAL   Final  MRSA PCR SCREENING     Status:  Abnormal   Collection Time    06/08/13  5:47 PM      Result Value Range Status   MRSA by PCR POSITIVE (*) NEGATIVE Final   Comment:            The GeneXpert MRSA Assay (FDA     approved for NASAL specimens     only), is one component of a     comprehensive MRSA colonization     surveillance program. It is not     intended to diagnose MRSA     infection nor to guide or     monitor treatment for     MRSA infections.     RESULT CALLED TO, READ BACK BY AND VERIFIED WITHIzetta Dakin RN [id number] 06/08/13 A BROWNING     Studies: Dg Chest Port 1 View  06/14/2013   CLINICAL DATA:  Reassess bilateral pleural effusions.  EXAM: PORTABLE CHEST - 1 VIEW  COMPARISON:  June 12, 2013  FINDINGS: There remain small to moderate-sized bilateral pleural effusions. These have not significantly changed since the earlier study. Basilar atelectasis versus infiltrate on the left is suspected. The cardiac silhouette remains enlarged. The pulmonary vascularity is not engorged. The pulmonary interstitial markings are mildly prominent bilaterally. The PICC line via the right upper extremity has its tip in the region of the midportion of the SVC.  IMPRESSION: There persistent moderate-sized bilateral pleural effusions not greatly changed from the earlier study. Persistent confluent density at the left lung base suggest atelectasis or pneumonia. There is no significant pulmonary vascular congestion.   Electronically Signed   By: David  [place]   On: 06/14/2013 13:35    Scheduled Meds: . aspirin  81 mg Oral Daily  . atorvastatin  20 mg Oral QHS  . clonazePAM  0.5 mg Oral q morning - 10a  . clonazePAM  1 mg Oral QHS  . docusate sodium  100 mg Oral BID  . famotidine  20 mg Oral Daily  . furosemide  40 mg Intravenous BID  . insulin aspart  0-5 Units Subcutaneous QHS  . insulin aspart  0-9 Units Subcutaneous TID WC  . insulin detemir  7 Units Subcutaneous BID  . ipratropium-albuterol  3 mL Nebulization TID  .  metoprolol tartrate  12.5 mg Oral BID  . potassium chloride  40 mEq Oral Once  . senna-docusate  1 tablet Oral QHS  . traZODone  50 mg Oral QHS  . venlafaxine XR  75 mg Oral Daily  . warfarin  5 mg Oral ONCE-1800  . Warfarin - Pharmacist Dosing Inpatient   Does not apply q1800   Continuous Infusions:   Active Problems:   Recurrent pleural effusion on right   Paroxysmal atrial fibrillation   Dysarthria due to cerebrovascular accident   Hemiparesis affecting right side as late effect of cerebrovascular accident   GERD (gastroesophageal reflux disease)   Dementia   Acute respiratory failure with hypoxia   Acute diastolic heart failure   Anemia   Diabetes mellitus   CKD (chronic kidney disease), stage III   Hypotension   Chronic respiratory failure with hypoxia    Time spent: 35 minutes    Jeralyn BennettZAMORA, Nayda Riesen  [facility] Pager [phone]. If 7PM-7AM, please contact night-coverage at [email or web address], password [email or web address] 06/15/2013, 2:51 PM  LOS: 7 days

## 2013-06-15 NOTE — Progress Notes (Signed)
\   Subjective: No complaints. Breathing better.   Objective: Vital signs in last 24 hours: Temp:  [98.4 F (36.9 C)-98.9 F (37.2 C)] 98.4 F (36.9 C) (01/19 0421) Pulse Rate:  [58-82] 58 (01/19 0421) Resp:  [18] 18 (01/19 0421) BP: (98-107)/(63-70) 107/63 mmHg (01/19 0421) SpO2:  [96 %-100 %] 100 % (01/19 0421) Weight:  [167 lb 12.5 oz (76.105 kg)] 167 lb 12.5 oz (76.105 kg) (01/19 0500) Last BM Date: 06/10/13  Intake/Output from previous day: 01/18 0701 - 01/19 0700 In: 580 [P.O.:580] Out: 1750 [Urine:1750] Intake/Output this shift: Total I/O In: 240 [P.O.:240] Out: -   Medications Current Facility-Administered Medications  Medication Dose Route Frequency Provider Last Rate Last Dose  . acetaminophen (TYLENOL) tablet 650 mg  650 mg Oral Q6H PRN Stephani PoliceMarianne L York, PA-C   650 mg at 06/14/13 1329   Or  . acetaminophen (TYLENOL) suppository 650 mg  650 mg Rectal Q6H PRN Stephani PoliceMarianne L York, PA-C      . alum & mag hydroxide-simeth (MAALOX/MYLANTA) 200-200-20 MG/5ML suspension 30 mL  30 mL Oral Q6H PRN Stephani PoliceMarianne L York, PA-C      . aspirin chewable tablet 81 mg  81 mg Oral Daily Stephani PoliceMarianne L York, PA-C   81 mg at 06/14/13 [id number]  . atorvastatin (LIPITOR) tablet 20 mg  20 mg Oral QHS Stephani PoliceMarianne L York, PA-C   20 mg at 06/14/13 2158  . clonazePAM (KLONOPIN) tablet 0.5 mg  0.5 mg Oral q morning - 10a Russella DarAllison L Ellis, NP   0.5 mg at 06/14/13 0958  . clonazePAM (KLONOPIN) tablet 1 mg  1 mg Oral QHS Russella DarAllison L Ellis, NP   1 mg at 06/14/13 2159  . docusate sodium (COLACE) capsule 100 mg  100 mg Oral BID Stephani PoliceMarianne L York, PA-C   100 mg at 06/14/13 2158  . famotidine (PEPCID) tablet 20 mg  20 mg Oral Daily Jeralyn BennettEzequiel Zamora, MD   20 mg at 06/14/13 0958  . furosemide (LASIX) injection 40 mg  40 mg Intravenous TID Rollene RotundaJames Hochrein, MD   40 mg at 06/14/13 2157  . insulin aspart (novoLOG) injection 0-5 Units  0-5 Units Subcutaneous QHS Russella DarAllison L Ellis, NP   3 Units at 06/09/13 2210  . insulin aspart  (novoLOG) injection 0-9 Units  0-9 Units Subcutaneous TID WC Russella DarAllison L Ellis, NP   1 Units at 06/12/13 1258  . insulin detemir (LEVEMIR) injection 7 Units  7 Units Subcutaneous BID Russella DarAllison L Ellis, NP   7 Units at 06/14/13 2157  . ipratropium-albuterol (DUONEB) 0.5-2.5 (3) MG/3ML nebulizer solution 3 mL  3 mL Nebulization TID Lonia BloodJeffrey T McClung, MD   3 mL at 06/15/13 0920  . metoprolol tartrate (LOPRESSOR) tablet 12.5 mg  12.5 mg Oral BID Stephani PoliceMarianne L York, PA-C   12.5 mg at 06/14/13 2158  . ondansetron (ZOFRAN) tablet 4 mg  4 mg Oral Q6H PRN Stephani PoliceMarianne L York, PA-C       Or  . ondansetron [facility](ZOFRAN) injection 4 mg  4 mg Intravenous Q6H PRN Stephani PoliceMarianne L York, PA-C      . oxyCODONE (Oxy IR/ROXICODONE) immediate release tablet 5 mg  5 mg Oral Q6H PRN Jeralyn BennettEzequiel Zamora, MD   5 mg at 06/14/13 1648  . senna-docusate (Senokot-S) tablet 1 tablet  1 tablet Oral QHS Stephani PoliceMarianne L York, PA-C   1 tablet at 06/14/13 2158  . sennosides-docusate sodium (SENOKOT-S) 8.6-50 MG tablet 1 tablet  1 tablet Oral Daily PRN Lonia BloodJeffrey T McClung, MD  1 tablet at 06/11/13 1036  . sodium chloride 0.9 % injection 10-40 mL  10-40 mL Intracatheter PRN Jeralyn Bennett, MD   10 mL at 06/15/13 0434  . traZODone (DESYREL) tablet 50 mg  50 mg Oral QHS Russella Dar, NP   50 mg at 06/14/13 2158  . venlafaxine XR (EFFEXOR-XR) 24 hr capsule 75 mg  75 mg Oral Daily Stephani Police, PA-C   75 mg at 06/14/13 7829  . Warfarin - Pharmacist Dosing Inpatient   Does not apply [id number] Clydia Llano, MD        PE: General appearance: alert, cooperative and no distress Lungs: decreased breath sounds at the bases bilaterally Heart: irregularly irregular rhythm and 2/6 murmur along LSB Extremities: no LEE Pulses: 2+ and symmetric Skin: warm and dry Neurologic: Grossly normal  Lab Results:   Recent Labs  06/15/13 0435  WBC 6.2  HGB 9.1*  HCT 29.0*  PLT 199   BMET  Recent Labs  06/13/13 0450 06/14/13 0425 06/15/13 0435  NA 140 141 142  K 3.9  3.6* 3.6*  CL 104 101 102  CO2 24 26 26   GLUCOSE 88 94 135*  BUN 30* 30* 32*  CREATININE 1.86* 1.88* 1.93*  CALCIUM 7.9* 8.2* 8.0*   PT/INR  Recent Labs  06/13/13 0450 06/14/13 0425 06/15/13 0435  LABPROT 19.3* 18.6* 19.2*  INR 1.68* 1.60* 1.67*   BNP (last 3 results)  Recent Labs  05/08/13 1054 06/08/13 0842 06/11/13 0440  PROBNP 17420.0* 21847.0* 15417.0*   Studies/Results:  Repeat CXR 06/14/13 IMPRESSION: There persistent moderate-sized bilateral pleural effusions not greatly changed from the earlier study. Persistent confluent density at the left lung base suggest atelectasis or pneumonia. There is no significant pulmonary vascular congestion.  Assessment/Plan  Active Problems:   Recurrent pleural effusion on right   Paroxysmal atrial fibrillation   Dysarthria due to cerebrovascular accident   Hemiparesis affecting right side as late effect of cerebrovascular accident   GERD (gastroesophageal reflux disease)   Dementia   Acute respiratory failure with hypoxia   Acute diastolic heart failure   Anemia   Diabetes mellitus   CKD (chronic kidney disease), stage III   Hypotension   Chronic respiratory failure with hypoxia  Plan: Pt notes improvement in breathing. No supplemental O2 requirements, at this time. Last SpO2 check was 100% on RA. F/u CXR yesterday demonstrated persistent moderate-sized bilateral pleural effusions not greatly changed from the earlier study. Persistent confluent density at the left lung base suggest atelectasis or pneumonia. There is no significant pulmonary vascular congestion. She has diuresed a total of 7.9 L since admission. Her last BNP check was 4 days ago and was 15,414 (down from 21,847 on admission). Will recheck BNP today. She has been on 40 mg IV Lasix TID. ? If we should decrease frequency as renal function is slightly worse, as SCr trends towards 2 (1.93 today). BP is stable. She remains in atrial fibrillation on telemetry with  a controlled ventricular response. HR in the 80s. She is asymptomatic. Will continue on 12.5 mg of Lopressor BID. She may require DCCV in several weeks if still in afib. Also continue Warfarin for stroke prophylaxis. INR is subthearpeutic at 1.67. Goal is 2.0-3.0. Pharmacy following.    LOS: 7 days     Brittainy M. Delmer Islam 06/15/2013 9:42 AM Patient seen and examined and history reviewed. Agree with above findings and plan. Patient reports her breathing is a lot better than admission. Diuresing well. Weight down. Still  has pleural effusions by CXR yesterday. Consider repeating tomorrow. Pharmacy adjusting coumadin. Renal function stable. Need to replete potassium. Afib rate well controlled.  Theron Arista [facility] 06/15/2013 10:20 AM

## 2013-06-15 NOTE — Progress Notes (Signed)
ANTICOAGULATION CONSULT NOTE   Pharmacy Consult for warfarin Indication: atrial fibrillation  Allergies  Allergen Reactions  . Baclofen   . Clindamycin/Lincomycin     Patient Measurements: Height: 5\' 5"  (165.1 cm) Weight: 167 lb 12.5 oz (76.105 kg) IBW/kg (Calculated) : 57  Vital Signs: Temp: 98.4 F (36.9 C) (01/19 0421) Temp src: Oral (01/19 0421) BP: 107/63 mmHg (01/19 0421) Pulse Rate: 58 (01/19 0421)  Labs:  Recent Labs  06/13/13 0450 06/14/13 0425 06/15/13 0435  HGB  --   --  9.1*  HCT  --   --  29.0*  PLT  --   --  199  LABPROT 19.3* 18.6* 19.2*  INR 1.68* 1.60* 1.67*  CREATININE 1.86* 1.88* 1.93*    Estimated Creatinine Clearance: 27.3 ml/min (by C-G formula based on Cr of 1.93).  Assessment: 72 YOF on warfarin for afib.  PTA dose 1.5 mg daily.  INR on admit 1.78, then was therapeutic but now has dropped. Not patient did receive a couple of doses of levofloxacin which can potentiate INR. There was also a warfarin order d/c'd on 1/14 with no mention of why in notes. The discontinuation of levofloxacin and the missed dose are likely the reasons behind the INR drop.  INR this morning 1.67. CBC is stable and no bleeding noted.  Goal of Therapy:  INR 2-3 Monitor platelets by anticoagulation protocol: Yes   Plan:  1. Warfarin 5mg  po x1 tonight 2. Daily PT/INR 3  Monitor for s/s of bleeding  4. Resume home dose at discharge - 1.5 mg po daily  Thank you. Okey RegalLisa Allana Shrestha, PharmD [phone]  06/15/2013 9:53 AM

## 2013-06-16 ENCOUNTER — Inpatient Hospital Stay (HOSPITAL_COMMUNITY): Payer: Medicare Other

## 2013-06-16 LAB — GLUCOSE, CAPILLARY
GLUCOSE-CAPILLARY: 126 mg/dL — AB (ref 70–99)
GLUCOSE-CAPILLARY: 141 mg/dL — AB (ref 70–99)
Glucose-Capillary: 115 mg/dL — ABNORMAL HIGH (ref 70–99)
Glucose-Capillary: 162 mg/dL — ABNORMAL HIGH (ref 70–99)

## 2013-06-16 LAB — BASIC METABOLIC PANEL
BUN: 33 mg/dL — ABNORMAL HIGH (ref 6–23)
CO2: 25 mEq/L (ref 19–32)
Calcium: 7.9 mg/dL — ABNORMAL LOW (ref 8.4–10.5)
Chloride: 105 mEq/L (ref 96–112)
Creatinine, Ser: 2 mg/dL — ABNORMAL HIGH (ref 0.50–1.10)
GFR calc Af Amer: 28 mL/min — ABNORMAL LOW (ref >90)
GFR calc non Af Amer: 24 mL/min — ABNORMAL LOW (ref >90)
GLUCOSE: 116 mg/dL — AB (ref 70–99)
POTASSIUM: 4.7 meq/L (ref 3.7–5.3)
Sodium: 141 mEq/L (ref 137–147)

## 2013-06-16 LAB — CBC
HCT: 29.2 % — ABNORMAL LOW (ref 36.0–46.0)
Hemoglobin: 8.8 g/dL — ABNORMAL LOW (ref 12.0–15.0)
MCH: 25.1 pg — AB (ref 26.0–34.0)
MCHC: 30.1 g/dL (ref 30.0–36.0)
MCV: 83.4 fL (ref 78.0–100.0)
Platelets: 188 10*3/uL (ref 150–400)
RBC: 3.5 MIL/uL — ABNORMAL LOW (ref 3.87–5.11)
RDW: 17.4 % — ABNORMAL HIGH (ref 11.5–15.5)
WBC: 6.6 10*3/uL (ref 4.0–10.5)

## 2013-06-16 LAB — PROTIME-INR
INR: 2.47 — ABNORMAL HIGH (ref 0.00–1.49)
Prothrombin Time: 25.9 seconds — ABNORMAL HIGH (ref 11.6–15.2)

## 2013-06-16 MED ORDER — WARFARIN SODIUM 2 MG PO TABS
2.0000 mg | ORAL_TABLET | Freq: Every day | ORAL | Status: DC
  Filled 2013-06-16: qty 1

## 2013-06-16 MED ORDER — FUROSEMIDE 40 MG PO TABS
40.0000 mg | ORAL_TABLET | Freq: Two times a day (BID) | ORAL | Status: DC
  Administered 2013-06-16 – 2013-06-17 (×2): 40 mg via ORAL
  Filled 2013-06-16 (×4): qty 1

## 2013-06-16 NOTE — Discharge Summary (Addendum)
Physician Discharge Summary  Ariana [name] ZOX:[id number] DOB: 1940-10-02 DOA: 06/08/2013  PCP: Garlan Fillers, MD  Admit date: 06/08/2013 Discharge date: 06/17/2013  Time spent: 35 minutes  Recommendations for Outpatient Follow-up:  1. Please followup on BMP in 3-4 days, patient receiving diuretic therapy during this hospitalization 2. Restart Coumadin from 06/18/13 and recheck INR in the next 3 days.  Discharge Diagnoses:  Active Problems:   Recurrent pleural effusion on right   Paroxysmal atrial fibrillation   Dysarthria due to cerebrovascular accident   Hemiparesis affecting right side as late effect of cerebrovascular accident   GERD (gastroesophageal reflux disease)   Dementia   Acute respiratory failure with hypoxia   Acute diastolic heart failure   Anemia   Diabetes mellitus   CKD (chronic kidney disease), stage III   Hypotension   Chronic respiratory failure with hypoxia   Discharge Condition: Stable  Diet recommendation: Speech pathology recommending dysphasia 3 (mechanical soft) thin liquids with supervision  Filed Weights   06/15/13 0500 06/16/13 0700 06/17/13 0621  Weight: 76.105 kg (167 lb 12.5 oz) 77.066 kg (169 lb 14.4 oz) 78.926 kg (174 lb)    History of present illness:  Ariana [name] is a 73 y.o. female, with a Hx of CHF, COPD, CAD, DM, Dementia, and recurrent transudative pleural effusion. She presents to the ED in acute hypoxic respiratory failure with SOB and dyspnea. She is currently requiring 4L of oxygen, she normally requires 2L at SNF. She has been hospitalized 2X in [place] and 2X at [facility] with recurrent pleural effusions felt to be due to CHF. A right pleural drain catheter was placed by IR on 11/24, but pulled out at bedside on 11/30 due to pain when the patient was in [facility]. Her son and daughter in law state that she was "fine" on Saturday 1/10, but symptoms began on Sunday 1/11. She began to deteriorate progressively with  SOB till admission to ED on 1/12. Family states that she currently has no cardiologist or pulmonologist.  In the emergency department she appears pale, fatigued and has increased work of breathing. CXR shows bibasilar opacities favored to be atelectasis with small bilateral pleural effusions. On exam the patient has significantly decreased breath sounds and a positive hepatojugular reflex. [place] [address]. She does not appear to have signs or symptoms of infection.   Hospital Course:  Patient is a pleasant 73 year old female with a past medical history of chronic diastolic congestive heart failure, recurrent right sided pleural effusions requiring multiple thoracentesis procedures in the past, admitted to the medicine service on 06/08/2013 when she presented with shortness of breath. Chest x-ray showed large right-sided pleural effusion. It appears that she had a right pleural drain catheter placed by interventional radiology on 04/20/2013, pulled at bedside on 04/26/2013 due to intolerance. Previous workup of fluid has been consistent with transudate of pleural effusion. She underwent thoracentesis during this hospitalization with 1.2 L removal of fluid. Her last transthoracic echocardiogram performed on 05/08/2013 which showed an ejection fraction appeared to be normal to mildly decreased with normal cavity size with pattern of severe LVH. The question of placing a Pleurx catheter was brought up during this hospitalization. Patient was seen and evaluated by Dr. Dorris Fetch of CT surgery who recommended treating the underlying cause of recurrent pleural effusions as Pleurx catheter placement would likely be unsatisfactory. Patient's diuresis was maximized during this hospitalization, receiving up to 40 mg IV 3 times a day of Lasix. There had been an upward trend and  creatinine in the last 3 days, going from 1.88 to 1.93 to 2.0 by 06/16/2013. On 06/16/2013 cardiology decreased her Lasix dose to 40 mg by mouth  twice a day. Plan to transfer her to skilled nursing facility tomorrow morning if she continues to remain stable.    Procedures:  Ultrasound-guided thoracentesis performed on 06/08/2013 yielding 1.2 L of clear yellow fluid. Patient tolerated procedure well there were no immediate complications.  Consultations:  Cardiology  Cardiothoracic surgery  Discharge Exam: Filed Vitals:   06/17/13 0621  BP: 108/85  Pulse: 71  Temp: 97.5 F (36.4 C)  Resp: 18    Discharge Instructions      Discharge Orders   Future Orders Complete By Expires   (HEART FAILURE PATIENTS) Call MD:  Anytime you have any of the following symptoms: 1) 3 pound weight gain in 24 hours or 5 pounds in 1 week 2) shortness of breath, with or without a dry hacking cough 3) swelling in the hands, feet or stomach 4) if you have to sleep on extra pillows at night in order to breathe.  As directed    Call MD for:  difficulty breathing, headache or visual disturbances  As directed    Diet - low sodium heart healthy  As directed    Diet Carb Modified  As directed    Increase activity slowly  As directed        Medication List         acetaminophen 325 MG tablet  Commonly known as:  TYLENOL  Take 650 mg by mouth every 4 (four) hours as needed (for pain).     albuterol (2.5 MG/3ML) 0.083% nebulizer solution  Commonly known as:  PROVENTIL  Take 2.5 mg by nebulization every 6 (six) hours as needed for wheezing or shortness of breath.     ASPIRIN ADULT LOW STRENGTH 81 MG chewable tablet  Generic drug:  aspirin  Chew 81 mg by mouth daily.     atorvastatin 20 MG tablet  Commonly known as:  LIPITOR  Take 20 mg by mouth at bedtime.     clonazePAM 1 MG tablet  Commonly known as:  KLONOPIN  Take 1 mg by mouth at bedtime.     clonazePAM 0.5 MG tablet  Commonly known as:  KLONOPIN  Take 1 tablet (0.5 mg total) by mouth every morning.     docusate sodium 100 MG capsule  Commonly known as:  COLACE  Take 100 mg by  mouth 2 (two) times daily.     famotidine 20 MG tablet  Commonly known as:  PEPCID  Take 20 mg by mouth 2 (two) times daily.     furosemide 40 MG tablet  Commonly known as:  LASIX  Take 1 tablet (40 mg total) by mouth 2 (two) times daily.     insulin aspart 100 UNIT/ML injection  Commonly known as:  novoLOG  - -9 Units, Subcutaneous, 3 times daily with meals  - CBG < 70: implement hypoglycemia protocol  - CBG 70 - 120: 0 units  - CBG 121 - 150: 1 unit  - CBG 151 - 200: 2 units  - CBG 201 - 250: 3 units  - CBG 251 - 300: 5 units  - CBG 301 - 350: 7 units  - CBG 351 - 400: 9 units  - CBG > 400: call MD     insulin detemir 100 UNIT/ML injection  Commonly known as:  LEVEMIR  Inject 7 Units into the skin 2 (  two) times daily.     ipratropium 0.02 % nebulizer solution  Commonly known as:  ATROVENT  Take 0.5 mg by nebulization every 6 (six) hours as needed for wheezing or shortness of breath.     metoprolol tartrate 25 MG tablet  Commonly known as:  LOPRESSOR  Take 25 mg by mouth 2 (two) times daily.     multivitamins ther. w/minerals Tabs tablet  Take 1 tablet by mouth daily.     potassium chloride 10 MEQ tablet  Commonly known as:  K-DUR,KLOR-CON  Take 10 mEq by mouth daily.     senna-docusate 8.6-50 MG per tablet  Commonly known as:  Senokot-S  Take 1 tablet by mouth at bedtime.     sennosides-docusate sodium 8.6-50 MG tablet  Commonly known as:  SENOKOT-S  Take 1 tablet by mouth daily as needed for constipation ((in addition to daily scheduled dose)).     traZODone 50 MG tablet  Commonly known as:  DESYREL  Take 50 mg by mouth at bedtime.     venlafaxine XR 75 MG 24 hr capsule  Commonly known as:  EFFEXOR-XR  Take 75 mg by mouth daily.     warfarin 1 MG tablet  Commonly known as:  COUMADIN  Take 1.5 tablets (1.5 mg total) by mouth at bedtime. Start on 06/18/13       Allergies  Allergen Reactions  . Baclofen   . Clindamycin/Lincomycin        The results of significant diagnostics from this hospitalization (including imaging, microbiology, ancillary and laboratory) are listed below for reference.    Significant Diagnostic Studies: Dg Chest 1 View  06/08/2013   CLINICAL DATA:  73 year old female status post right thoracentesis. Initial encounter.  EXAM: CHEST - 1 VIEW  COMPARISON:  0831 hr the same day and earlier.  FINDINGS: Portable AP semi upright view at at 1235 hrs. Improved lung volumes. Improved right lung base ventilation. No pneumothorax identified. Residual opacity at the left lung base. Stable cardiac size and mediastinal contours.  IMPRESSION: Improved ventilation and decreased right lung base opacity with no pneumothorax status post right thoracentesis.   Electronically Signed   By: Augusto Gamble M.D.   On: 06/08/2013 12:59   Dg Chest 2 View  06/16/2013   CLINICAL DATA:  Shortness of breath, recurrent pleural effusions.  EXAM: CHEST  2 VIEW  COMPARISON:  06/14/2013 and 06/12/2013  FINDINGS: Right-sided PICC line is unchanged. Lungs are somewhat hypoinflated with worsening small to moderate pleural effusions left greater than right. There is worsening opacification over the left midlung which may be due to atelectasis or infection. There is stable cardiomegaly. There is calcification of the thoracoabdominal aorta. Posterior she fusion hardware is present over the upper lumbar spine unchanged. Mild anterior wedging of a lower thoracic vertebral body unchanged. Marland Kitchen  IMPRESSION: Small to moderate bilateral pleural effusions left greater than right with slight worsening. Opacification of the left midlung slightly worse which may be due to atelectasis or infection.  Right-sided PICC line unchanged.  Stable cardiomegaly.   Electronically Signed   By: Elberta Fortis M.D.   On: 06/16/2013 08:53   Dg Chest 2 View  06/12/2013   CLINICAL DATA:  Pleural effusion.  Short of breath.  Weakness.  EXAM: CHEST  2 VIEW  COMPARISON:  DG CHEST 2 VIEW  dated 06/10/2013; DG CHEST 1V PORT dated 06/08/2013; DG CHEST 1V PORT dated 06/08/2013  FINDINGS: Cardiopericardial silhouette is enlarged and partially obscured by airspace disease and effusion  at the bases. Right upper extremity PICC is present with the tip in the mid SVC. Monitoring leads project over the chest. Aortic arch atherosclerosis. Moderate bilateral pleural effusions are present, with associated compressive atelectasis of the lower lobes. Mild pulmonary vascular congestion. Prominent skin fold projects over the right lateral chest.  IMPRESSION: 1. Unchanged cardiomegaly. 2. Moderate bilateral pleural effusions. No interval change from recent prior exams. 3. Unchanged right upper extremity PICC.   Electronically Signed   By: Andreas NewportGeoffrey  Lamke M.D.   On: 06/12/2013 12:01   Dg Chest 2 View  06/10/2013   CLINICAL DATA:  Short of breath, bilateral pleural effusions  EXAM: CHEST  2 VIEW  COMPARISON:  Prior chest x-ray 06/08/2013  FINDINGS: Large right and moderate left effusions. The right effusion is layering, there may be some mild loculation in the left base. No pneumothorax. Stable cardiomegaly. Aortic atherosclerosis. Incompletely imaged lumbar spine fixation hardware.  IMPRESSION: 1. Large layering right pleural effusion. 2. Moderate left pleural effusion with suggestion of mild basilar loculation. 3. Cardiomegaly and aortic atherosclerosis.   Electronically Signed   By: Malachy MoanHeath  McCullough M.D.   On: 06/10/2013 17:47   Ct Head Wo Contrast  05/30/2013   CLINICAL DATA:  History of CVA.  EXAM: CT HEAD WITHOUT CONTRAST  TECHNIQUE: Contiguous axial images were obtained from the base of the skull through the vertex without intravenous contrast.  COMPARISON:  None.  FINDINGS: Examination demonstrates evidence of an old large left MCA territory infarct and left occipital infarct. There is chronic ischemic microvascular disease present. The ventricles, cisterns and remaining CSF spaces are otherwise unremarkable.  Possible old sub cm focal infarct over the right cerebellum. There is no mass, mass effect, shift of midline structures or acute hemorrhage. Remaining bones and soft tissues are unremarkable.  IMPRESSION: No acute intracranial findings.  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   Electronically Signed   By: Elberta Fortisaniel  Boyle M.D.   On: 05/30/2013 19:00   Ct Chest Wo Contrast  05/30/2013   CLINICAL DATA:  Recurrent pleural effusion.  Shortness of breath.  EXAM: CT CHEST WITHOUT CONTRAST  TECHNIQUE: Multidetector CT imaging of the chest was performed following the standard protocol without IV contrast.  COMPARISON:  04/26/2013  FINDINGS: Moderate right pleural effusion has increased in size since previous study. Small left pleural effusion shows no significant change.  Moderate cardiomegaly and tiny pericardial effusion are stable. Diffuse pulmonary interstitial infiltrates show no significant change, and are suspicious for diffuse interstitial edema or atypical infection. Compressive atelectasis is seen bilaterally related to the pleural effusions, but there is no evidence of focal pulmonary consolidation.  Shotty subcentimeter mediastinal lymph nodes are stable. Calcified bilateral hilar lymph nodes are again seen, consistent with old granulomatous disease. A benign left adrenal adenoma remains stable.  IMPRESSION: Stable cardiomegaly, tiny pericardial effusion, and diffuse interstitial infiltrates suspicious for diffuse interstitial edema or atypical infection.  Increased size of moderate right pleural effusion. Stable small left pleural effusion.  Stable benign left adrenal adenoma.   Electronically Signed   By: Myles RosenthalJohn  Stahl M.D.   On: 05/30/2013 19:09   Dg Chest Port 1 View  06/14/2013   CLINICAL DATA:  Reassess bilateral pleural effusions.  EXAM: PORTABLE CHEST - 1 VIEW  COMPARISON:  June 12, 2013  FINDINGS: There remain small to moderate-sized  bilateral pleural effusions. These have not significantly changed since the earlier study. Basilar atelectasis versus infiltrate on the left  is suspected. The cardiac silhouette remains enlarged. The pulmonary vascularity is not engorged. The pulmonary interstitial markings are mildly prominent bilaterally. The PICC line via the right upper extremity has its tip in the region of the midportion of the SVC.  IMPRESSION: There persistent moderate-sized bilateral pleural effusions not greatly changed from the earlier study. Persistent confluent density at the left lung base suggest atelectasis or pneumonia. There is no significant pulmonary vascular congestion.   Electronically Signed   By: David  [place]   On: 06/14/2013 13:35   Dg Chest Port 1 View  06/08/2013   CLINICAL DATA:  Short of breath.  EXAM: PORTABLE CHEST - 1 VIEW  COMPARISON:  06/08/2013.  FINDINGS: Cardiomegaly. Right upper extremity PICC is present with the tip in the mid SVC. Aortic arch atherosclerosis. Bilateral basilar airspace disease and atelectasis is present. This is favored to represent pulmonary edema. Small bilateral pleural effusions are present. Thoracolumbar fixation hardware incidentally noted. Monitoring leads project over the chest.  Compared to the prior, basilar aeration has worsened slightly.  IMPRESSION: Constellation of findings compatible with progressive mild CHF.   Electronically Signed   By: Andreas Newport M.D.   On: 06/08/2013 17:15   Dg Chest Port 1 View  06/08/2013   CLINICAL DATA:  Chest pain and shortness of breath  EXAM: PORTABLE CHEST - 1 VIEW  COMPARISON:  Prior chest x-ray 06/08/2013  FINDINGS: New right upper extremity approach PICC. Catheter tip in good position at the distal SVC. Stable cardiac and mediastinal contours with mild cardiomegaly. Improving diffuse interstitial opacities. Small left and possibly trace right pleural effusions. The left effusion is stable to slightly smaller compared to prior.  Atherosclerotic calcification again noted in the transverse aorta. No pneumothorax.  IMPRESSION: 1. Improving bilateral interstitial opacities may reflect resolving interstitial edema, or atypical infection. 2. Improving small left pleural effusion. Probable trace right pleural effusion as well. 3. Right upper extremity PICC with the catheter tip projecting over the distal SVC.   Electronically Signed   By: Malachy Moan M.D.   On: 06/08/2013 14:46   Dg Chest Portable 1 View  06/08/2013   CLINICAL DATA:  73 year old female shortness of Breath weakness and confusion. Initial encounter.  EXAM: PORTABLE CHEST - 1 VIEW  COMPARISON:  06/02/2013 and earlier.  FINDINGS: Portable AP semi upright view at 0831 hrs. Lower lung volumes. Increased crowding of markings at both bases. No pneumothorax or large effusion, but small effusions are suspected. Decreased pulmonary vascularity without overt edema. Stable cardiac size and mediastinal contours. Partially visible lumbar fusion hardware.  IMPRESSION: 1. Lower lung volumes with increased bibasilar opacity, favor atelectasis. 2. Small bilateral pleural effusions are suspected. No overt pulmonary edema.   Electronically Signed   By: Augusto Gamble M.D.   On: 06/08/2013 08:40   Dg Chest Port 1 View  06/02/2013   CLINICAL DATA:  Followup effusions  EXAM: PORTABLE CHEST - 1 VIEW  COMPARISON:  05/31/2013  FINDINGS: Cardiac shadow remains enlarged. Calcification of the thoracic aorta is again identified. Bilateral small effusions are noted right slightly greater than left. The right effusion has recurred in the interval from the prior exam. No focal confluent infiltrate is seen. Mild central vascular congestion is noted. Postoperative changes in the lumbar spine are seen. A left-sided PICC line is noted with the tip in the left innominate vein and stable.  IMPRESSION: Recurrent right-sided pleural effusion. The remainder of the exam is stable from the prior study.   Electronically  Signed  By: Alcide Clever M.D.   On: 06/02/2013 08:03   Dg Chest Port 1 View  05/31/2013   ADDENDUM REPORT: 05/31/2013 09:31  ADDENDUM: This examination was reviewed with attention to the specific location of the left arm PICC. Chest CT 05/30/2013 and chest radiographs ranging from 05/08/2013 through 05/30/2013 are correlated. The PICC tip is superior to the aortic arch, within the left brachiocephalic vein on recent CT. The tip has retracted from the upper SVC compared with the radiographs obtained 3 weeks ago.   Electronically Signed   By: Roxy Horseman M.D.   On: 05/31/2013 09:31   05/31/2013   CLINICAL DATA:  Status post thoracentesis  EXAM: PORTABLE CHEST - 1 VIEW  COMPARISON:  Prior chest CT from 05/30/2013  FINDINGS: Cardiomegaly is stable as compared to prior exam. Mediastinal silhouette within normal limits. Left PICC catheter is unchanged.  Lungs are normally inflated. There has been interval improvement in previously seen right pleural effusion, consistent with history of thoracentesis. No definite pneumothorax identified. A small residual right pleural effusion persists. Left-sided pleural effusion is similar.  There is diffuse pulmonary vascular congestion without frank pulmonary edema. No definite focal infiltrate. Linear left basilar atelectasis is noted.  IMPRESSION: 1. No pneumothorax identified status post thoracentesis. A small residual right pleural effusion persist. 2. Grossly stable left-sided pleural effusion. 3. Cardiomegaly with diffuse pulmonary vascular congestion.  Electronically Signed: By: Rise Mu M.D. On: 05/31/2013 04:55   Dg Chest Portable 1 View  05/30/2013   CLINICAL DATA:  Shortness of breath.  CHF.  EXAM: PORTABLE CHEST - 1 VIEW  COMPARISON:  05/20/2013  FINDINGS: PICC line tip is in the left innominate vein region. Diffuse interstitial densities suggest pulmonary edema. Increased densities in the right lower chest could represent layering pleural fluid. Heart  size appears to be upper limits of normal. No evidence for a pneumothorax.  IMPRESSION: Increased densities in the right lower chest may represent enlarging pleural effusion.  Interstitial pulmonary edema.  PICC line tip in the left innominate vein region.   Electronically Signed   By: Richarda Overlie M.D.   On: 05/30/2013 13:30   Dg Chest Port 1 View  05/20/2013   CLINICAL DATA:  Respiratory failure.  EXAM: PORTABLE CHEST - 1 VIEW  COMPARISON:  05/18/2013.  FINDINGS: PICC line in stable position with its tip in the brachiocephalic vein on the left. Persistent severe cardiomegaly. Mild pulmonary vascular prominence cannot be excluded. Persistent bilateral pulmonary interstitial prominence with bilateral pleural effusions. These findings have improved slightly from prior exam. No pneumothorax. Bilateral subclavian artery atherosclerotic vascular disease. Carotid atherosclerotic vascular disease.  IMPRESSION: 1. Persistent congestive heart failure with pulmonary interstitial edema and bilateral pleural effusions. These findings have improved from prior exam.  2. Bilateral dense carotid and subclavian artery calcification indicating atherosclerotic vascular disease .   Electronically Signed   By: Maisie Fus  Register   On: 05/20/2013 08:01   Dg Chest Port 1 View  05/18/2013   CLINICAL DATA:  Reassess pleural effusion.  EXAM: PORTABLE CHEST - 1 VIEW  COMPARISON:  May 15, 2013.  FINDINGS: The lungs are reasonably well inflated. The hemidiaphragms remain partially obscured. There is a moderate-sized right pleural effusion and smaller left pleural effusion. The pulmonary interstitial markings remain increased but have slightly improved on the right since the earlier study. The cardiopericardial silhouette remains enlarged. The pulmonary vascularity remains indistinct. The PICC line on the left is unchanged in appearance with the tip in the region of  the junction of right and left brachiocephalic veins. There is  calcification within the visualized portions of the neck which may overlie within the carotid arterial systems bilaterally.  IMPRESSION: There are persistent bilateral pleural effusions greater on the right than on the left. There has been slight interval improvement in the appearance of the pulmonary interstitium on the right. Pulmonary interstitial edema remains bilaterally.   Electronically Signed   By: David  [place]   On: 05/18/2013 07:52   US Thoracentesis Asp Pleural Space W/img Guide  06/08/2013   CLINICAL DATA:  Congestive heart failure, shortness of breath. Recurrent right-sided pleural effusion. Request therapeutic thoracentesis.  EXAM: ULTRASOUND GUIDED right THORACENTESIS  COMPARISON:  Previous thoracentesis  FINDINGS: A total of approximately 1.2 L of clear yellow fluid was removed. A fluid sample was notsent for laboratory analysis.  IMPRESSION: Successful ultrasound guided right thoracentesis yielding 1.2 L of pleural fluid.\  Read by: Brayton El PA-C  PROCEDURE: An ultrasound guided thoracentesis was thoroughly discussed with the patient and questions answered. The benefits, risks, alternatives and complications were also discussed. The patient understands and wishes to proceed with the procedure. Written consent was obtained.  Ultrasound was performed to localize and mark an adequate pocket of fluid in the right chest. The area was then prepped and draped in the normal sterile fashion. 1% Lidocaine was used for local anesthesia. Under ultrasound guidance a 19 gauge Yueh catheter was introduced. Thoracentesis was performed. The catheter was removed and a dressing applied.  Complications:  None.   Electronically Signed   By: Richarda Overlie M.D.   On: 06/08/2013 13:20    Microbiology: Recent Results (from the past 240 hour(s))  CULTURE, BLOOD (ROUTINE X 2)     Status: None   Collection Time    06/08/13 10:35 AM      Result Value Range Status   Specimen Description BLOOD LEFT ANTECUBITAL    Final   Special Requests BOTTLES DRAWN AEROBIC AND ANAEROBIC 10CC   Final   Culture  Setup Time     Final   Value: 06/08/2013 14:10     Performed at [facility]   Culture     Final   Value: NO GROWTH 5 DAYS     Performed at [facility]   Report Status 06/14/2013 FINAL   Final  CULTURE, BLOOD (ROUTINE X 2)     Status: None   Collection Time    06/08/13 10:45 AM      Result Value Range Status   Specimen Description BLOOD RIGHT HAND   Final   Special Requests BOTTLES DRAWN AEROBIC ONLY 5CC   Final   Culture  Setup Time     Final   Value: 06/08/2013 14:10     Performed at [facility]   Culture     Final   Value: NO GROWTH 5 DAYS     Performed at [facility]   Report Status 06/14/2013 FINAL   Final  MRSA PCR SCREENING     Status: Abnormal   Collection Time    06/08/13  5:47 PM      Result Value Range Status   MRSA by PCR POSITIVE (*) NEGATIVE Final   Comment:            The GeneXpert MRSA Assay (FDA     approved for NASAL specimens     only), is one component of a     comprehensive MRSA colonization     surveillance program.  It is not     intended to diagnose MRSA     infection nor to guide or     monitor treatment for     MRSA infections.     RESULT CALLED TO, READ BACK BY AND VERIFIED WITH:     C BUNGQUE RN [id number] 06/08/13 A BROWNING     Labs: Basic Metabolic Panel:  Recent Labs Lab 06/13/13 0450 06/14/13 0425 06/15/13 0435 06/16/13 0522 06/17/13 0530  NA 140 141 142 141 139  K 3.9 3.6* 3.6* 4.7 4.8  CL 104 101 102 105 106  CO2 24 26 26 25 24   GLUCOSE 88 94 135* 116* 106*  BUN 30* 30* 32* 33* 32*  CREATININE 1.86* 1.88* 1.93* 2.00* 1.69*  CALCIUM 7.9* 8.2* 8.0* 7.9* 8.0*   Liver Function Tests: No results found for this basename: AST, ALT, ALKPHOS, BILITOT, PROT, ALBUMIN,  in the last 168 hours No results found for this basename: LIPASE, AMYLASE,  in the last 168 hours No results found for this basename: AMMONIA,  in the  last 168 hours CBC:  Recent Labs Lab 06/12/13 0442 06/15/13 0435 06/16/13 0522 06/17/13 0530  WBC 5.4 6.2 6.6 7.5  HGB 9.2* 9.1* 8.8* 8.8*  HCT 30.5* 29.0* 29.2* 29.6*  MCV 84.0 82.6 83.4 84.3  PLT 199 199 188 187   Cardiac Enzymes: No results found for this basename: CKTOTAL, CKMB, CKMBINDEX, TROPONINI,  in the last 168 hours BNP: BNP (last 3 results)  Recent Labs  06/08/13 0842 06/11/13 0440 06/15/13 0435  PROBNP 21847.0* 15417.0* 7917.0*   CBG:  Recent Labs Lab 06/16/13 0615 06/16/13 1145 06/16/13 1658 06/16/13 2136 06/17/13 0608  GLUCAP 115* 126* 141* 162* 111*   Signed:  GHIMIRE,SHANKER  [facility] 06/17/2013, 10:39 AM

## 2013-06-16 NOTE — Progress Notes (Signed)
ANTICOAGULATION CONSULT NOTE   Pharmacy Consult for warfarin Indication: atrial fibrillation  Allergies  Allergen Reactions  . Baclofen   . Clindamycin/Lincomycin     Patient Measurements: Height: 5\' 5"  (165.1 cm) Weight: 169 lb 14.4 oz (77.066 kg) IBW/kg (Calculated) : 57  Vital Signs: Temp: 98.4 F (36.9 C) (01/20 0617) Temp src: Oral (01/20 0617) BP: 93/68 mmHg (01/20 0754) Pulse Rate: 72 (01/20 0754)  Labs:  Recent Labs  06/14/13 0425 06/15/13 0435 06/16/13 0522  HGB  --  9.1* 8.8*  HCT  --  29.0* 29.2*  PLT  --  199 188  LABPROT 18.6* 19.2* 25.9*  INR 1.60* 1.67* 2.47*  CREATININE 1.88* 1.93* 2.00*    Estimated Creatinine Clearance: 26.5 ml/min (by C-G formula based on Cr of 2).  Assessment: 72 YOF on warfarin for afib.  PTA dose 1.5 mg daily.  INR on admit 1.78, then was therapeutic but now has dropped. Not patient did receive a couple of doses of levofloxacin which can potentiate INR. There was also a warfarin order d/c'd on 1/14 with no mention of why in notes. The discontinuation of levofloxacin and the missed dose are likely the reasons behind the INR drop.  INR this morning 2.47 (up from 1.67)  Goal of Therapy:  INR 2-3 Monitor platelets by anticoagulation protocol: Yes   Plan:  1.No Coumadin today 2. Daily PT/INR 3  Monitor for s/s of bleeding  4. Increased dose at discharge - 2 mg po daily  Thank you. Okey RegalLisa Shayli Altemose, PharmD [phone]  06/16/2013 10:38 AM

## 2013-06-16 NOTE — Progress Notes (Signed)
Patient Name: Ariana [name] Date of Encounter: 06/16/2013  Active Problems:   Recurrent pleural effusion on right   Paroxysmal atrial fibrillation   Dysarthria due to cerebrovascular accident   Hemiparesis affecting right side as late effect of cerebrovascular accident   GERD (gastroesophageal reflux disease)   Dementia   Acute respiratory failure with hypoxia   Acute diastolic heart failure   Anemia   Diabetes mellitus   CKD (chronic kidney disease), stage III   Hypotension   Chronic respiratory failure with hypoxia    SUBJECTIVE: Pt is dysarthric but does answer appropriately yes/no. Denies chest pain, SOB not much better, not eating that well.   OBJECTIVE Filed Vitals:   06/15/13 2206 06/16/13 0617 06/16/13 0700 06/16/13 0754  BP: 104/53 92/56  93/68  Pulse: 83 64  72  Temp: 97.2 F (36.2 C) 98.4 F (36.9 C)    TempSrc: Oral Oral    Resp: 18 18  18   Height:      Weight:   169 lb 14.4 oz (77.066 kg)   SpO2: 99% 92%  100%    Intake/Output Summary (Last 24 hours) at 06/16/13 1048 Last data filed at 06/16/13 0800  Gross per 24 hour  Intake    720 ml  Output   1300 ml  Net   -580 ml   Filed Weights   06/14/13 0520 06/15/13 0500 06/16/13 0700  Weight: 169 lb (76.658 kg) 167 lb 12.5 oz (76.105 kg) 169 lb 14.4 oz (77.066 kg)    PHYSICAL EXAM General: Well developed, well nourished, female seems anxious. Head: Normocephalic, atraumatic.  Neck: Supple without bruits, JVD at 10 cm. Lungs:  Resp regular and unlabored, decreased BS bilateral bases, L>R. Heart: RRR, S1, S2, no S3, S4, Soft murmur; no rub. Abdomen: Soft, non-tender, non-distended, BS + x 4.  Extremities: No clubbing, cyanosis, trace R edema.  Neuro: Alert and oriented X 2. Moves all extremities spontaneously. Psych: Normal affect.  LABS: CBC: Recent Labs  06/15/13 0435 06/16/13 0522  WBC 6.2 6.6  HGB 9.1* 8.8*  HCT 29.0* 29.2*  MCV 82.6 83.4  PLT 199 188   INR: Recent Labs  06/16/13 0522  INR 2.47*   Basic Metabolic Panel: Recent Labs  06/15/13 0435 06/16/13 0522  NA 142 141  K 3.6* 4.7  CL 102 105  CO2 26 25  GLUCOSE 135* 116*  BUN 32* 33*  CREATININE 1.93* 2.00*  CALCIUM 8.0* 7.9*   BNP: Pro B Natriuretic peptide (BNP)  Date/Time Value Range Status  06/15/2013  4:35 AM 7917.0* 0 - 125 pg/mL Final  06/11/2013  4:40 AM 15417.0* 0 - 125 pg/mL Final    TELE: SR, PVCs and occasional pairs.         Radiology/Studies: Dg Chest 2 View 06/16/2013   CLINICAL DATA:  Shortness of breath, recurrent pleural effusions.  EXAM: CHEST  2 VIEW  COMPARISON:  06/14/2013 and 06/12/2013  FINDINGS: Right-sided PICC line is unchanged. Lungs are somewhat hypoinflated with worsening small to moderate pleural effusions left greater than right. There is worsening opacification over the left midlung which may be due to atelectasis or infection. There is stable cardiomegaly. There is calcification of the thoracoabdominal aorta. Posterior she fusion hardware is present over the upper lumbar spine unchanged. Mild anterior wedging of a lower thoracic vertebral body unchanged. Marland Kitchen  IMPRESSION: Small to moderate bilateral pleural effusions left greater than right with slight worsening. Opacification of the left midlung slightly worse which may be  due to atelectasis or infection.  Right-sided PICC line unchanged.  Stable cardiomegaly.   Electronically Signed   By: Elberta Fortis M.D.   On: 06/16/2013 08:53   Dg Chest Port 1 View 06/14/2013   CLINICAL DATA:  Reassess bilateral pleural effusions.  EXAM: PORTABLE CHEST - 1 VIEW  COMPARISON:  June 12, 2013  FINDINGS: There remain small to moderate-sized bilateral pleural effusions. These have not significantly changed since the earlier study. Basilar atelectasis versus infiltrate on the left is suspected. The cardiac silhouette remains enlarged. The pulmonary vascularity is not engorged. The pulmonary interstitial markings are mildly prominent  bilaterally. The PICC line via the right upper extremity has its tip in the region of the midportion of the SVC.  IMPRESSION: There persistent moderate-sized bilateral pleural effusions not greatly changed from the earlier study. Persistent confluent density at the left lung base suggest atelectasis or pneumonia. There is no significant pulmonary vascular congestion.   Electronically Signed   By: David  [place]   On: 06/14/2013 13:35     Current Medications:  . aspirin  81 mg Oral Daily  . atorvastatin  20 mg Oral QHS  . clonazePAM  0.5 mg Oral q morning - 10a  . clonazePAM  1 mg Oral QHS  . docusate sodium  100 mg Oral BID  . famotidine  20 mg Oral Daily  . furosemide  40 mg Intravenous BID  . insulin aspart  0-5 Units Subcutaneous QHS  . insulin aspart  0-9 Units Subcutaneous TID WC  . insulin detemir  7 Units Subcutaneous BID  . ipratropium-albuterol  3 mL Nebulization TID  . metoprolol tartrate  12.5 mg Oral BID  . senna-docusate  1 tablet Oral QHS  . traZODone  50 mg Oral QHS  . venlafaxine XR  75 mg Oral Daily  . [START ON 06/17/2013] warfarin  2 mg Oral q1800  . Warfarin - Pharmacist Dosing Inpatient   Does not apply q1800      ASSESSMENT AND PLAN: 73 yo female with a history of CHF, right pleural effusion, PAF on coumadin, CAD, COPD, DM, GERD, dementia, CVA with right hemiparesis. The patient's EF in December 2014 was read and normal or mildly decreased, severe LVH, LA moderately dilated. Admitted 01/12 with acute diastolic CHF and  Effusion, s/p thoracentesis 01/12 with 1.2 L off. TCTS saw, pleurx cath not recommended at this time.    Acute diastolic heart failure - Lasix 40 mg IV BID, but effusions are worsening slightly by CXR 01/20, do not think BP will tolerate dose increase (SBP 90s-100s). Weight down only 1 lb since admission but BNP much better. IM needs to know cardiac meds and doses at d/c.     Paroxysmal atrial fibrillation - Rate is controlled on low-dose BB,  continue    Chronic anticoagulation - coumadin therapeutic, per pharmacy  Otherwise, per IM, d/c to SNF when medically ready. Active Problems:   Recurrent pleural effusion on right   Dysarthria due to cerebrovascular accident   Hemiparesis affecting right side as late effect of cerebrovascular accident   GERD (gastroesophageal reflux disease)   Dementia   Acute respiratory failure with hypoxia   Anemia   Diabetes mellitus   CKD (chronic kidney disease), stage III   Hypotension   Chronic respiratory failure with hypoxia   Signed, Theodore Demark , PA-C 10:48 AM 06/16/2013 Patient seen and examined and history reviewed. Agree with above findings and plan. States breathing is doing ok. No change from yesterday. It  looks like we are maintaining status quo with diuretics. Difficult to push harder with renal function and low BP. Stressed importance of sodium restriction (she has a whole box of [place]- cheese crackers at bedside). Will switch diuretics to po. May be reaching maximal benefit of hospital Rx. If stable will plan on transfer to NH in am.  Theron Aristaeter [facility] Hospital Of BellaireJordanMD,FACC 06/16/2013 11:26 AM

## 2013-06-16 NOTE — Progress Notes (Signed)
[facility] PROGRESS NOTE  Ariana [name] WJX:[id number] DOB: May 17, 1941 DOA: 06/08/2013 PCP: Garlan Fillers, MD  Assessment/Plan: 1. Recurrent right-sided pleural effusions. Patient having multiple thoracentesis procedures over the last several months, undergoing thoracentesis during this hospitalization with the removal of 1.2 L of fluid. She has been seen and evaluated by CT surgery who recommended treating underlying cause of recurrent effusions as Pleurx catheter placement would likely be Unsatisfactory. Repeat chest x-ray showing slightly worse opacification at left midlung. Also worsening small to moderate pleural effusions left greater than right. We'll continue to maximize diuresis, transitioning to oral Lasix 2. Acute on chronic diastolic congestive heart failure. BNP elevated at 15,417. Last transthoracic echocardiogram was performed on 05/08/2013 which showed normal ejection fraction, with wall thickness increased in pattern of severe LVH. Creatinine trended up to 1.93, Lasix dose be decreased to 40 mg IV twice a day, hopefully can be transitioned to oral Lasix and discharged soon. 3. Paroxysmal age of fibrillation. Presently rate controlled, continue metoprolol 12.5 mg by mouth twice a day.  4. Insulin-dependent diabetes mellitus. Hemoglobin A1c of 6.1 on 05/30/2013. Continue Accu-Cheks q. a.c. and each bedtime with sliding scale coverage. Continue insulin detemir.  5. Stage III chronic kidney disease. Creatinine a little higher at 1.9, decreasing lasix dose.  6. Anticoagulation. Pharmacy consulted for warfarin management. INR remains subtherapeutic at 1.6. Await further recommendations from pharmacy regarding warfarin dosing, will increase warfarin dose this evening.   Code Status: Full code Family Communication: Patient is daughter-in-law present at bedside and updated. Disposition Plan: Transitioning to oral Lasix, hopefully can be discharged to SNF in a.m. if remains stable     Consultants:  Cardiology  CT surgery  Procedures:  Ultrasound-guided thoracentesis   HPI/Subjective: Patient is a pleasant 73 year old female with a past medical history of chronic diastolic congestive heart failure, recurrent right sided pleural effusions requiring multiple thoracentesis procedures in the past, admitted to the medicine service on 06/08/2013 when she presented with shortness of breath. Chest x-ray showed large right-sided pleural effusion. It appears that she had a right pleural drain catheter placed by interventional radiology on 04/20/2013, pulled at bedside on 04/26/2013 to 2 intolerance. Previous workup of fluid has been consistent with transudate of pleural effusion. She underwent thoracentesis with 1.2 L removal of fluid. Patient believes that she is feeling better this morning, and may be breathing easier. Repeat chest x-ray done on 06/12/2013 showing stable pleural effusions.   Objective: Filed Vitals:   06/16/13 1605  BP: 118/74  Pulse: 80  Temp:   Resp: 18    Intake/Output Summary (Last 24 hours) at 06/16/13 1642 Last data filed at 06/16/13 1300  Gross per 24 hour  Intake    720 ml  Output   1150 ml  Net   -430 ml   Filed Weights   06/14/13 0520 06/15/13 0500 06/16/13 0700  Weight: 76.658 kg (169 lb) 76.105 kg (167 lb 12.5 oz) 77.066 kg (169 lb 14.4 oz)    Exam:   General:  Patient is in no acute distress, feels that she is breathing easier this morning.  Cardiovascular: Normal S1-S2 no murmurs rubs or gallops  Respiratory: Patient having by basilar crackles with diminished breath sounds at bases. On supplemental oxygen, does not appear to be in acute respiratory distress.  Abdomen: Soft nontender nondistended  Musculoskeletal: Present range of motion to all extremities  Data Reviewed: Basic Metabolic Panel:  Recent Labs Lab 06/11/13 0440 06/13/13 0450 06/14/13 0425 06/15/13 0435 06/16/13 0522  NA 141  140 141 142 141  K 3.8  3.9 3.6* 3.6* 4.7  CL 105 104 101 102 105  CO2 24 24 26 26 25   GLUCOSE 72 88 94 135* 116*  BUN 28* 30* 30* 32* 33*  CREATININE 1.88* 1.86* 1.88* 1.93* 2.00*  CALCIUM 8.4 7.9* 8.2* 8.0* 7.9*   Liver Function Tests: No results found for this basename: AST, ALT, ALKPHOS, BILITOT, PROT, ALBUMIN,  in the last 168 hours No results found for this basename: LIPASE, AMYLASE,  in the last 168 hours No results found for this basename: AMMONIA,  in the last 168 hours CBC:  Recent Labs Lab 06/10/13 0225 06/12/13 0442 06/15/13 0435 06/16/13 0522  WBC 8.9 5.4 6.2 6.6  HGB 9.0* 9.2* 9.1* 8.8*  HCT 28.7* 30.5* 29.0* 29.2*  MCV 81.5 84.0 82.6 83.4  PLT 210 199 199 188   Cardiac Enzymes: No results found for this basename: CKTOTAL, CKMB, CKMBINDEX, TROPONINI,  in the last 168 hours BNP (last 3 results)  Recent Labs  06/08/13 0842 06/11/13 0440 06/15/13 0435  PROBNP 21847.0* 15417.0* 7917.0*   CBG:  Recent Labs Lab 06/15/13 1121 06/15/13 1710 06/15/13 2158 06/16/13 0615 06/16/13 1145  GLUCAP 167* 115* 122* 115* 126*    Recent Results (from the past 240 hour(s))  CULTURE, BLOOD (ROUTINE X 2)     Status: None   Collection Time    06/08/13 10:35 AM      Result Value Range Status   Specimen Description BLOOD LEFT ANTECUBITAL   Final   Special Requests BOTTLES DRAWN AEROBIC AND ANAEROBIC 10CC   Final   Culture  Setup Time     Final   Value: 06/08/2013 14:10     Performed at [facility]   Culture     Final   Value: NO GROWTH 5 DAYS     Performed at [facility]   Report Status 06/14/2013 FINAL   Final  CULTURE, BLOOD (ROUTINE X 2)     Status: None   Collection Time    06/08/13 10:45 AM      Result Value Range Status   Specimen Description BLOOD RIGHT HAND   Final   Special Requests BOTTLES DRAWN AEROBIC ONLY 5CC   Final   Culture  Setup Time     Final   Value: 06/08/2013 14:10     Performed at [facility]   Culture     Final   Value: NO GROWTH  5 DAYS     Performed at [facility]   Report Status 06/14/2013 FINAL   Final  MRSA PCR SCREENING     Status: Abnormal   Collection Time    06/08/13  5:47 PM      Result Value Range Status   MRSA by PCR POSITIVE (*) NEGATIVE Final   Comment:            The GeneXpert MRSA Assay (FDA     approved for NASAL specimens     only), is one component of a     comprehensive MRSA colonization     surveillance program. It is not     intended to diagnose MRSA     infection nor to guide or     monitor treatment for     MRSA infections.     RESULT CALLED TO, READ BACK BY AND VERIFIED WITHIzetta Dakin RN 2219 06/08/13 A BROWNING     Studies: Dg Chest 2 View  06/16/2013   CLINICAL DATA:  Shortness of breath, recurrent pleural effusions.  EXAM: CHEST  2 VIEW  COMPARISON:  06/14/2013 and 06/12/2013  FINDINGS: Right-sided PICC line is unchanged. Lungs are somewhat hypoinflated with worsening small to moderate pleural effusions left greater than right. There is worsening opacification over the left midlung which may be due to atelectasis or infection. There is stable cardiomegaly. There is calcification of the thoracoabdominal aorta. Posterior she fusion hardware is present over the upper lumbar spine unchanged. Mild anterior wedging of a lower thoracic vertebral body unchanged. Marland Kitchen.  IMPRESSION: Small to moderate bilateral pleural effusions left greater than right with slight worsening. Opacification of the left midlung slightly worse which may be due to atelectasis or infection.  Right-sided PICC line unchanged.  Stable cardiomegaly.   Electronically Signed   By: Elberta Fortisaniel  Boyle M.D.   On: 06/16/2013 08:53    Scheduled Meds: . aspirin  81 mg Oral Daily  . atorvastatin  20 mg Oral QHS  . clonazePAM  0.5 mg Oral q morning - 10a  . clonazePAM  1 mg Oral QHS  . docusate sodium  100 mg Oral BID  . famotidine  20 mg Oral Daily  . furosemide  40 mg Oral BID  . insulin aspart  0-5 Units Subcutaneous QHS   . insulin aspart  0-9 Units Subcutaneous TID WC  . insulin detemir  7 Units Subcutaneous BID  . ipratropium-albuterol  3 mL Nebulization TID  . metoprolol tartrate  12.5 mg Oral BID  . senna-docusate  1 tablet Oral QHS  . traZODone  50 mg Oral QHS  . venlafaxine XR  75 mg Oral Daily  . [START ON 06/17/2013] warfarin  2 mg Oral q1800  . Warfarin - Pharmacist Dosing Inpatient   Does not apply q1800   Continuous Infusions:   Active Problems:   Recurrent pleural effusion on right   Paroxysmal atrial fibrillation   Dysarthria due to cerebrovascular accident   Hemiparesis affecting right side as late effect of cerebrovascular accident   GERD (gastroesophageal reflux disease)   Dementia   Acute respiratory failure with hypoxia   Acute diastolic heart failure   Anemia   Diabetes mellitus   CKD (chronic kidney disease), stage III   Hypotension   Chronic respiratory failure with hypoxia    Time spent: 35 minutes    Jeralyn BennettZAMORA, Alcide Memoli  [facility] Pager [phone]. If 7PM-7AM, please contact night-coverage at [email or web address], password [facility] [email or web address] 06/16/2013, 4:42 PM  LOS: 8 days

## 2013-06-17 LAB — CBC
HEMATOCRIT: 29.6 % — AB (ref 36.0–46.0)
Hemoglobin: 8.8 g/dL — ABNORMAL LOW (ref 12.0–15.0)
MCH: 25.1 pg — AB (ref 26.0–34.0)
MCHC: 29.7 g/dL — ABNORMAL LOW (ref 30.0–36.0)
MCV: 84.3 fL (ref 78.0–100.0)
PLATELETS: 187 10*3/uL (ref 150–400)
RBC: 3.51 MIL/uL — ABNORMAL LOW (ref 3.87–5.11)
RDW: 17.2 % — AB (ref 11.5–15.5)
WBC: 7.5 10*3/uL (ref 4.0–10.5)

## 2013-06-17 LAB — BASIC METABOLIC PANEL
BUN: 32 mg/dL — AB (ref 6–23)
CALCIUM: 8 mg/dL — AB (ref 8.4–10.5)
CO2: 24 meq/L (ref 19–32)
Chloride: 106 mEq/L (ref 96–112)
Creatinine, Ser: 1.69 mg/dL — ABNORMAL HIGH (ref 0.50–1.10)
GFR calc Af Amer: 34 mL/min — ABNORMAL LOW (ref >90)
GFR calc non Af Amer: 29 mL/min — ABNORMAL LOW (ref >90)
GLUCOSE: 106 mg/dL — AB (ref 70–99)
Potassium: 4.8 mEq/L (ref 3.7–5.3)
Sodium: 139 mEq/L (ref 137–147)

## 2013-06-17 LAB — PROTIME-INR
INR: 3.21 — AB (ref 0.00–1.49)
Prothrombin Time: 31.7 seconds — ABNORMAL HIGH (ref 11.6–15.2)

## 2013-06-17 LAB — GLUCOSE, CAPILLARY
Glucose-Capillary: 111 mg/dL — ABNORMAL HIGH (ref 70–99)
Glucose-Capillary: 133 mg/dL — ABNORMAL HIGH (ref 70–99)

## 2013-06-17 MED ORDER — CLONAZEPAM 0.5 MG PO TABS: 0.5000 mg | ORAL_TABLET | Freq: Every morning | ORAL | Status: AC

## 2013-06-17 MED ORDER — FUROSEMIDE 40 MG PO TABS
40.0000 mg | ORAL_TABLET | Freq: Two times a day (BID) | ORAL | Status: DC
Start: 2013-06-17 — End: 2013-07-22

## 2013-06-17 MED ORDER — WARFARIN SODIUM 1 MG PO TABS: 1.5000 mg | ORAL_TABLET | Freq: Every day | ORAL | Status: DC

## 2013-06-17 MED ORDER — INSULIN ASPART 100 UNIT/ML ~~LOC~~ SOLN: SUBCUTANEOUS | Status: DC

## 2013-06-17 NOTE — Progress Notes (Signed)
Transported via [facility] for discharge to [facility].Mamie LeversBaldwin, Jevan Gaunt E

## 2013-06-17 NOTE — Progress Notes (Signed)
ANTICOAGULATION CONSULT NOTE   Pharmacy Consult for warfarin Indication: atrial fibrillation  Allergies  Allergen Reactions  . Baclofen   . Clindamycin/Lincomycin     Patient Measurements: Height: 5\' 5"  (165.1 cm) Weight: 174 lb (78.926 kg) IBW/kg (Calculated) : 57  Vital Signs: Temp: 97.5 F (36.4 C) (01/21 0621) Temp src: Oral (01/21 0621) BP: 108/85 mmHg (01/21 0621) Pulse Rate: 71 (01/21 0621)  Labs:  Recent Labs  06/15/13 0435 06/16/13 0522 06/17/13 0530  HGB 9.1* 8.8* 8.8*  HCT 29.0* 29.2* 29.6*  PLT 199 188 187  LABPROT 19.2* 25.9* 31.7*  INR 1.67* 2.47* 3.21*  CREATININE 1.93* 2.00* 1.69*    Estimated Creatinine Clearance: 31.7 ml/min (by C-G formula based on Cr of 1.69).  Assessment: 72 YOF on warfarin for afib.  PTA dose 1.5 mg daily.  INR on admit 1.78, then was therapeutic but now has dropped. Not patient did receive a couple of doses of levofloxacin which can potentiate INR. There was also a warfarin order d/c'd on 1/14 with no mention of why in notes. The discontinuation of levofloxacin and the missed dose are likely the reasons behind the INR drop.  INR this morning 3.21 (up from 2.47)  Goal of Therapy:  INR 2-3 Monitor platelets by anticoagulation protocol: Yes   Plan:  1. No Coumadin today 2. INR in AM if not discharged (INR Friday at SNF, if possible)  3  Monitor for s/s of bleeding  4. Back on Coumadin 1.5 mg po daily at SNF, starting tomorrow  Thank you. Okey RegalLisa Marya Lowden, PharmD [phone]  06/17/2013 9:37 AM

## 2013-06-17 NOTE — Progress Notes (Signed)
CSW has attempted to call son's phone numbers listed in the facesheet. Mobile number is not a working phone number and CSW left voicemail for home number.

## 2013-06-17 NOTE — Progress Notes (Signed)
[facility] given [facility] Beechamynthia[facility] Assisting patient with getting dressed. Awaiting transport via [facility].Mamie LeversBaldwin, Linell Shawn E

## 2013-06-17 NOTE — Progress Notes (Addendum)
Clinical Social Worker facilitated patient discharge by speaking with the patient, calling patient's son and facility, [facility]. Patient and patient's son agreeable to this plan and arranging transport via EMS for 3pm.. CSW will sign off, as social work intervention is no longer needed.  Maree KrabbeLindsay Montel Vanderhoof, MSW, Theresia MajorsLCSWA [phone]

## 2013-06-17 NOTE — Progress Notes (Signed)
PATIENT DETAILS Name: Ariana [name] Age: 73 y.o. Sex: female Date of Birth: 11/20/1940 Admit Date: 06/08/2013 Admitting Physician Clydia Llano, MD ZOX:[id number] G, MD  Subjective: No major complaints. Stable overnight. Denies shortness of breath.  Assessment/Plan: Active Problems:   Recurrent pleural effusion on right - Transudative and suspected secondary to underlying congestive heart failure. - Is a history of having multiple thoracocentesis for seizures over the past few months, has had a Pleurx catheter placed and subsequently removed. Has had consultation with CT surgery during this admission who have recommended to continue with treating the underlying congestive heart failure and not consider placing a Pleurx catheter. - Currently stable, ready to discharge to skilled nursing facility  Acute on chronic diastolic congestive  - Clinically compensated. - Lasix dose decreased to 40 mg twice a day. 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  -Presently rate controlled, continue metoprolol 12.5 mg by mouth twice a day.  - On Coumadin with slightly supratherapeutic INR, to continue with Coumadin at home dose from 1/22  Acute on chronic kidney disease stage III - Acute renal failure secondary to diuretics, creatinine now back to baseline at discharge.  Diabetes - Stable with Levemir and SSI.  Disposition: Remain inpatient  DVT Prophylaxis: Not needed as on coumadin  Code Status:  DNR  Family Communication None at bedside  Procedures:  None  CONSULTS:  cardiology and Cardiothoracic surgery  Time spent 40 minutes-which includes 50% of the time with face-to-face with patient/ family and coordinating care related to the above assessment and plan.    MEDICATIONS: Scheduled Meds: . aspirin  81 mg Oral Daily  . atorvastatin  20 mg Oral QHS  . clonazePAM  0.5 mg Oral q morning - 10a  . clonazePAM  1 mg Oral  QHS  . docusate sodium  100 mg Oral BID  . famotidine  20 mg Oral Daily  . furosemide  40 mg Oral BID  . insulin aspart  0-5 Units Subcutaneous QHS  . insulin aspart  0-9 Units Subcutaneous TID WC  . insulin detemir  7 Units Subcutaneous BID  . ipratropium-albuterol  3 mL Nebulization TID  . metoprolol tartrate  12.5 mg Oral BID  . senna-docusate  1 tablet Oral QHS  . traZODone  50 mg Oral QHS  . venlafaxine XR  75 mg Oral Daily  . Warfarin - Pharmacist Dosing Inpatient   Does not apply q1800   Continuous Infusions:  PRN Meds:.acetaminophen, acetaminophen, alum & mag hydroxide-simeth, ondansetron (ZOFRAN) IV, ondansetron, oxyCODONE, sennosides-docusate sodium, sodium chloride  Antibiotics: Anti-infectives   Start     Dose/Rate Route Frequency Ordered Stop   06/08/13 0945  levofloxacin (LEVAQUIN) IVPB 750 mg     750 mg 100 mL/hr over 90 Minutes Intravenous  Once 06/08/13 0933 06/08/13 1230       PHYSICAL EXAM: Vital signs in last 24 hours: Filed Vitals:   06/16/13 1605 06/16/13 2051 06/16/13 2140 06/17/13 0621  BP: 118/74  98/69 108/85  Pulse: 80  100 71  Temp:   98.4 F (36.9 C) 97.5 F (36.4 C)  TempSrc:   Oral Oral  Resp: 18  18 18   Height:      Weight:    78.926 kg (174 lb)  SpO2: 96% 98% 95% 96%    Weight change: 1.86 kg (4 lb 1.6 oz) Filed Weights   06/15/13 0500 06/16/13 0700 06/17/13 0621  Weight: 76.105 kg (167 lb 12.5 oz) 77.066 kg (169 lb  14.4 oz) 78.926 kg (174 lb)   Body mass index is 28.96 kg/(m^2).   Gen Exam: Awake and alert with clear speech.   Neck: Supple, No JVD.   Chest: B/L Clear.   CVS: S1 S2 Regular, no murmurs.  Abdomen: soft, BS +, non tender, non distended.  Extremities: no edema, lower extremities warm to touch. Neurologic: Non Focal.   Skin: No Rash.   Wounds: N/A.    Intake/Output from previous day:  Intake/Output Summary (Last 24 hours) at 06/17/13 1040 Last data filed at 06/16/13 1700  Gross per 24 hour  Intake    480 ml   Output      0 ml  Net    480 ml     LAB RESULTS: CBC  Recent Labs Lab 06/12/13 0442 06/15/13 0435 06/16/13 0522 06/17/13 0530  WBC 5.4 6.2 6.6 7.5  HGB 9.2* 9.1* 8.8* 8.8*  HCT 30.5* 29.0* 29.2* 29.6*  PLT 199 199 188 187  MCV 84.0 82.6 83.4 84.3  MCH 25.3* 25.9* 25.1* 25.1*  MCHC 30.2 31.4 30.1 29.7*  RDW 18.2* 17.5* 17.4* 17.2*    Chemistries   Recent Labs Lab 06/13/13 0450 06/14/13 0425 06/15/13 0435 06/16/13 0522 06/17/13 0530  NA 140 141 142 141 139  K 3.9 3.6* 3.6* 4.7 4.8  CL 104 101 102 105 106  CO2 24 26 26 25 24   GLUCOSE 88 94 135* 116* 106*  BUN 30* 30* 32* 33* 32*  CREATININE 1.86* 1.88* 1.93* 2.00* 1.69*  CALCIUM 7.9* 8.2* 8.0* 7.9* 8.0*    CBG:  Recent Labs Lab 06/16/13 0615 06/16/13 1145 06/16/13 1658 06/16/13 2136 06/17/13 0608  GLUCAP 115* 126* 141* 162* 111*    GFR Estimated Creatinine Clearance: 31.7 ml/min (by C-G formula based on Cr of 1.69).  Coagulation profile  Recent Labs Lab 06/13/13 0450 06/14/13 0425 06/15/13 0435 06/16/13 0522 06/17/13 0530  INR 1.68* 1.60* 1.67* 2.47* 3.21*    Cardiac Enzymes No results found for this basename: CK, CKMB, TROPONINI, MYOGLOBIN,  in the last 168 hours  No components found with this basename: POCBNP,  No results found for this basename: DDIMER,  in the last 72 hours No results found for this basename: HGBA1C,  in the last 72 hours No results found for this basename: CHOL, HDL, LDLCALC, TRIG, CHOLHDL, LDLDIRECT,  in the last 72 hours No results found for this basename: TSH, T4TOTAL, FREET3, T3FREE, THYROIDAB,  in the last 72 hours No results found for this basename: VITAMINB12, FOLATE, FERRITIN, TIBC, IRON, RETICCTPCT,  in the last 72 hours No results found for this basename: LIPASE, AMYLASE,  in the last 72 hours  Urine Studies No results found for this basename: UACOL, UAPR, USPG, UPH, UTP, UGL, UKET, UBIL, UHGB, UNIT, UROB, ULEU, UEPI, UWBC, URBC, UBAC, CAST, CRYS, UCOM,  BILUA,  in the last 72 hours  MICROBIOLOGY: Recent Results (from the past 240 hour(s))  CULTURE, BLOOD (ROUTINE X 2)     Status: None   Collection Time    06/08/13 10:35 AM      Result Value Range Status   Specimen Description BLOOD LEFT ANTECUBITAL   Final   Special Requests BOTTLES DRAWN AEROBIC AND ANAEROBIC 10CC   Final   Culture  Setup Time     Final   Value: 06/08/2013 14:10     Performed at [facility]   Culture     Final   Value: NO GROWTH 5 DAYS     Performed at  [facility]   Report Status 06/14/2013 FINAL   Final  CULTURE, BLOOD (ROUTINE X 2)     Status: None   Collection Time    06/08/13 10:45 AM      Result Value Range Status   Specimen Description BLOOD RIGHT HAND   Final   Special Requests BOTTLES DRAWN AEROBIC ONLY 5CC   Final   Culture  Setup Time     Final   Value: 06/08/2013 14:10     Performed at [facility]   Culture     Final   Value: NO GROWTH 5 DAYS     Performed at [facility]   Report Status 06/14/2013 FINAL   Final  MRSA PCR SCREENING     Status: Abnormal   Collection Time    06/08/13  5:47 PM      Result Value Range Status   MRSA by PCR POSITIVE (*) NEGATIVE Final   Comment:            The GeneXpert MRSA Assay (FDA     approved for NASAL specimens     only), is one component of a     comprehensive MRSA colonization     surveillance program. It is not     intended to diagnose MRSA     infection nor to guide or     monitor treatment for     MRSA infections.     RESULT CALLED TO, READ BACK BY AND VERIFIED WITHIzetta Dakin RN [id number] 06/08/13 A BROWNING    RADIOLOGY STUDIES/RESULTS: Dg Chest 1 View  06/08/2013   CLINICAL DATA:  73 year old female status post right thoracentesis. Initial encounter.  EXAM: CHEST - 1 VIEW  COMPARISON:  0831 hr the same day and earlier.  FINDINGS: Portable AP semi upright view at at 1235 hrs. Improved lung volumes. Improved right lung base ventilation. No pneumothorax identified.  Residual opacity at the left lung base. Stable cardiac size and mediastinal contours.  IMPRESSION: Improved ventilation and decreased right lung base opacity with no pneumothorax status post right thoracentesis.   Electronically Signed   By: Augusto Gamble M.D.   On: 06/08/2013 12:59   Dg Chest 2 View  06/16/2013   CLINICAL DATA:  Shortness of breath, recurrent pleural effusions.  EXAM: CHEST  2 VIEW  COMPARISON:  06/14/2013 and 06/12/2013  FINDINGS: Right-sided PICC line is unchanged. Lungs are somewhat hypoinflated with worsening small to moderate pleural effusions left greater than right. There is worsening opacification over the left midlung which may be due to atelectasis or infection. There is stable cardiomegaly. There is calcification of the thoracoabdominal aorta. Posterior she fusion hardware is present over the upper lumbar spine unchanged. Mild anterior wedging of a lower thoracic vertebral body unchanged. Marland Kitchen  IMPRESSION: Small to moderate bilateral pleural effusions left greater than right with slight worsening. Opacification of the left midlung slightly worse which may be due to atelectasis or infection.  Right-sided PICC line unchanged.  Stable cardiomegaly.   Electronically Signed   By: Elberta Fortis M.D.   On: 06/16/2013 08:53   Dg Chest 2 View  06/12/2013   CLINICAL DATA:  Pleural effusion.  Short of breath.  Weakness.  EXAM: CHEST  2 VIEW  COMPARISON:  DG CHEST 2 VIEW dated 06/10/2013; DG CHEST 1V PORT dated 06/08/2013; DG CHEST 1V PORT dated 06/08/2013  FINDINGS: Cardiopericardial silhouette is enlarged and partially obscured by airspace disease and effusion at the bases.  Right upper extremity PICC is present with the tip in the mid SVC. Monitoring leads project over the chest. Aortic arch atherosclerosis. Moderate bilateral pleural effusions are present, with associated compressive atelectasis of the lower lobes. Mild pulmonary vascular congestion. Prominent skin fold projects over the right  lateral chest.  IMPRESSION: 1. Unchanged cardiomegaly. 2. Moderate bilateral pleural effusions. No interval change from recent prior exams. 3. Unchanged right upper extremity PICC.   Electronically Signed   By: Andreas Newport M.D.   On: 06/12/2013 12:01   Dg Chest 2 View  06/10/2013   CLINICAL DATA:  Short of breath, bilateral pleural effusions  EXAM: CHEST  2 VIEW  COMPARISON:  Prior chest x-ray 06/08/2013  FINDINGS: Large right and moderate left effusions. The right effusion is layering, there may be some mild loculation in the left base. No pneumothorax. Stable cardiomegaly. Aortic atherosclerosis. Incompletely imaged lumbar spine fixation hardware.  IMPRESSION: 1. Large layering right pleural effusion. 2. Moderate left pleural effusion with suggestion of mild basilar loculation. 3. Cardiomegaly and aortic atherosclerosis.   Electronically Signed   By: Malachy Moan M.D.   On: 06/10/2013 17:47   Ct Head Wo Contrast  05/30/2013   CLINICAL DATA:  History of CVA.  EXAM: CT HEAD WITHOUT CONTRAST  TECHNIQUE: Contiguous axial images were obtained from the base of the skull through the vertex without intravenous contrast.  COMPARISON:  None.  FINDINGS: Examination demonstrates evidence of an old large left MCA territory infarct and left occipital infarct. There is chronic ischemic microvascular disease present. The ventricles, cisterns and remaining CSF spaces are otherwise unremarkable. Possible old sub cm focal infarct over the right cerebellum. There is no mass, mass effect, shift of midline structures or acute hemorrhage. Remaining bones and soft tissues are unremarkable.  IMPRESSION: No acute intracranial findings.  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   Electronically Signed   By: Elberta Fortis M.D.   On: 05/30/2013 19:00   Ct Chest Wo Contrast  05/30/2013   CLINICAL DATA:  Recurrent pleural effusion.  Shortness of breath.  EXAM: CT CHEST  WITHOUT CONTRAST  TECHNIQUE: Multidetector CT imaging of the chest was performed following the standard protocol without IV contrast.  COMPARISON:  04/26/2013  FINDINGS: Moderate right pleural effusion has increased in size since previous study. Small left pleural effusion shows no significant change.  Moderate cardiomegaly and tiny pericardial effusion are stable. Diffuse pulmonary interstitial infiltrates show no significant change, and are suspicious for diffuse interstitial edema or atypical infection. Compressive atelectasis is seen bilaterally related to the pleural effusions, but there is no evidence of focal pulmonary consolidation.  Shotty subcentimeter mediastinal lymph nodes are stable. Calcified bilateral hilar lymph nodes are again seen, consistent with old granulomatous disease. A benign left adrenal adenoma remains stable.  IMPRESSION: Stable cardiomegaly, tiny pericardial effusion, and diffuse interstitial infiltrates suspicious for diffuse interstitial edema or atypical infection.  Increased size of moderate right pleural effusion. Stable small left pleural effusion.  Stable benign left adrenal adenoma.   Electronically Signed   By: Myles Rosenthal M.D.   On: 05/30/2013 19:09   Dg Chest Port 1 View  06/14/2013   CLINICAL DATA:  Reassess bilateral pleural effusions.  EXAM: PORTABLE CHEST - 1 VIEW  COMPARISON:  June 12, 2013  FINDINGS: There remain small to moderate-sized bilateral pleural effusions. These have not significantly changed since the earlier study. Basilar atelectasis versus infiltrate on the left is suspected. The  cardiac silhouette remains enlarged. The pulmonary vascularity is not engorged. The pulmonary interstitial markings are mildly prominent bilaterally. The PICC line via the right upper extremity has its tip in the region of the midportion of the SVC.  IMPRESSION: There persistent moderate-sized bilateral pleural effusions not greatly changed from the earlier study. Persistent  confluent density at the left lung base suggest atelectasis or pneumonia. There is no significant pulmonary vascular congestion.   Electronically Signed   By: David  [place]   On: 06/14/2013 13:35   Dg Chest Port 1 View  06/08/2013   CLINICAL DATA:  Short of breath.  EXAM: PORTABLE CHEST - 1 VIEW  COMPARISON:  06/08/2013.  FINDINGS: Cardiomegaly. Right upper extremity PICC is present with the tip in the mid SVC. Aortic arch atherosclerosis. Bilateral basilar airspace disease and atelectasis is present. This is favored to represent pulmonary edema. Small bilateral pleural effusions are present. Thoracolumbar fixation hardware incidentally noted. Monitoring leads project over the chest.  Compared to the prior, basilar aeration has worsened slightly.  IMPRESSION: Constellation of findings compatible with progressive mild CHF.   Electronically Signed   By: Andreas Newport M.D.   On: 06/08/2013 17:15   Dg Chest Port 1 View  06/08/2013   CLINICAL DATA:  Chest pain and shortness of breath  EXAM: PORTABLE CHEST - 1 VIEW  COMPARISON:  Prior chest x-ray 06/08/2013  FINDINGS: New right upper extremity approach PICC. Catheter tip in good position at the distal SVC. Stable cardiac and mediastinal contours with mild cardiomegaly. Improving diffuse interstitial opacities. Small left and possibly trace right pleural effusions. The left effusion is stable to slightly smaller compared to prior. Atherosclerotic calcification again noted in the transverse aorta. No pneumothorax.  IMPRESSION: 1. Improving bilateral interstitial opacities may reflect resolving interstitial edema, or atypical infection. 2. Improving small left pleural effusion. Probable trace right pleural effusion as well. 3. Right upper extremity PICC with the catheter tip projecting over the distal SVC.   Electronically Signed   By: Malachy Moan M.D.   On: 06/08/2013 14:46   Dg Chest Portable 1 View  06/08/2013   CLINICAL DATA:  73 year old female  shortness of Breath weakness and confusion. Initial encounter.  EXAM: PORTABLE CHEST - 1 VIEW  COMPARISON:  06/02/2013 and earlier.  FINDINGS: Portable AP semi upright view at 0831 hrs. Lower lung volumes. Increased crowding of markings at both bases. No pneumothorax or large effusion, but small effusions are suspected. Decreased pulmonary vascularity without overt edema. Stable cardiac size and mediastinal contours. Partially visible lumbar fusion hardware.  IMPRESSION: 1. Lower lung volumes with increased bibasilar opacity, favor atelectasis. 2. Small bilateral pleural effusions are suspected. No overt pulmonary edema.   Electronically Signed   By: Augusto Gamble M.D.   On: 06/08/2013 08:40   Dg Chest Port 1 View  06/02/2013   CLINICAL DATA:  Followup effusions  EXAM: PORTABLE CHEST - 1 VIEW  COMPARISON:  05/31/2013  FINDINGS: Cardiac shadow remains enlarged. Calcification of the thoracic aorta is again identified. Bilateral small effusions are noted right slightly greater than left. The right effusion has recurred in the interval from the prior exam. No focal confluent infiltrate is seen. Mild central vascular congestion is noted. Postoperative changes in the lumbar spine are seen. A left-sided PICC line is noted with the tip in the left innominate vein and stable.  IMPRESSION: Recurrent right-sided pleural effusion. The remainder of the exam is stable from the prior study.   Electronically Signed   By:  Alcide Clever M.D.   On: 06/02/2013 08:03   Dg Chest Port 1 View  05/31/2013   ADDENDUM REPORT: 05/31/2013 09:31  ADDENDUM: This examination was reviewed with attention to the specific location of the left arm PICC. Chest CT 05/30/2013 and chest radiographs ranging from 05/08/2013 through 05/30/2013 are correlated. The PICC tip is superior to the aortic arch, within the left brachiocephalic vein on recent CT. The tip has retracted from the upper SVC compared with the radiographs obtained 3 weeks ago.    Electronically Signed   By: Roxy Horseman M.D.   On: 05/31/2013 09:31   05/31/2013   CLINICAL DATA:  Status post thoracentesis  EXAM: PORTABLE CHEST - 1 VIEW  COMPARISON:  Prior chest CT from 05/30/2013  FINDINGS: Cardiomegaly is stable as compared to prior exam. Mediastinal silhouette within normal limits. Left PICC catheter is unchanged.  Lungs are normally inflated. There has been interval improvement in previously seen right pleural effusion, consistent with history of thoracentesis. No definite pneumothorax identified. A small residual right pleural effusion persists. Left-sided pleural effusion is similar.  There is diffuse pulmonary vascular congestion without frank pulmonary edema. No definite focal infiltrate. Linear left basilar atelectasis is noted.  IMPRESSION: 1. No pneumothorax identified status post thoracentesis. A small residual right pleural effusion persist. 2. Grossly stable left-sided pleural effusion. 3. Cardiomegaly with diffuse pulmonary vascular congestion.  Electronically Signed: By: Rise Mu M.D. On: 05/31/2013 04:55   Dg Chest Portable 1 View  05/30/2013   CLINICAL DATA:  Shortness of breath.  CHF.  EXAM: PORTABLE CHEST - 1 VIEW  COMPARISON:  05/20/2013  FINDINGS: PICC line tip is in the left innominate vein region. Diffuse interstitial densities suggest pulmonary edema. Increased densities in the right lower chest could represent layering pleural fluid. Heart size appears to be upper limits of normal. No evidence for a pneumothorax.  IMPRESSION: Increased densities in the right lower chest may represent enlarging pleural effusion.  Interstitial pulmonary edema.  PICC line tip in the left innominate vein region.   Electronically Signed   By: Richarda Overlie M.D.   On: 05/30/2013 13:30   Dg Chest Port 1 View  05/20/2013   CLINICAL DATA:  Respiratory failure.  EXAM: PORTABLE CHEST - 1 VIEW  COMPARISON:  05/18/2013.  FINDINGS: PICC line in stable position with its tip in the  brachiocephalic vein on the left. Persistent severe cardiomegaly. Mild pulmonary vascular prominence cannot be excluded. Persistent bilateral pulmonary interstitial prominence with bilateral pleural effusions. These findings have improved slightly from prior exam. No pneumothorax. Bilateral subclavian artery atherosclerotic vascular disease. Carotid atherosclerotic vascular disease.  IMPRESSION: 1. Persistent congestive heart failure with pulmonary interstitial edema and bilateral pleural effusions. These findings have improved from prior exam.  2. Bilateral dense carotid and subclavian artery calcification indicating atherosclerotic vascular disease .   Electronically Signed   By: Maisie Fus  Register   On: 05/20/2013 08:01   US Thoracentesis Asp Pleural Space W/img Guide  06/08/2013   CLINICAL DATA:  Congestive heart failure, shortness of breath. Recurrent right-sided pleural effusion. Request therapeutic thoracentesis.  EXAM: ULTRASOUND GUIDED right THORACENTESIS  COMPARISON:  Previous thoracentesis  FINDINGS: A total of approximately 1.2 L of clear yellow fluid was removed. A fluid sample was notsent for laboratory analysis.  IMPRESSION: Successful ultrasound guided right thoracentesis yielding 1.2 L of pleural fluid.\  Read by: Brayton El PA-C  PROCEDURE: An ultrasound guided thoracentesis was thoroughly discussed with the patient and questions answered. The benefits, risks,  alternatives and complications were also discussed. The patient understands and wishes to proceed with the procedure. Written consent was obtained.  Ultrasound was performed to localize and mark an adequate pocket of fluid in the right chest. The area was then prepped and draped in the normal sterile fashion. 1% Lidocaine was used for local anesthesia. Under ultrasound guidance a 19 gauge Yueh catheter was introduced. Thoracentesis was performed. The catheter was removed and a dressing applied.  Complications:  None.   Electronically  Signed   By: Richarda Overlie M.D.   On: 06/08/2013 13:20    Jeoffrey Massed, MD  [facility] Pager:[phone]  If 7PM-7AM, please contact night-coverage [email or web address] Password [email or web address] 06/17/2013, 10:40 AM   LOS: 9 days

## 2013-06-17 NOTE — Progress Notes (Signed)
Patient Name: Ariana [name] Date of Encounter: 06/17/2013  Active Problems:   Recurrent pleural effusion on right   Paroxysmal atrial fibrillation   Dysarthria due to cerebrovascular accident   Hemiparesis affecting right side as late effect of cerebrovascular accident   GERD (gastroesophageal reflux disease)   Dementia   Acute respiratory failure with hypoxia   Acute diastolic heart failure   Anemia   Diabetes mellitus   CKD (chronic kidney disease), stage III   Hypotension   Chronic respiratory failure with hypoxia    SUBJECTIVE: Pt is dysarthric but does answer appropriately yes/no. Denies chest pain, SOB OK. States it is no different than yesterday.  OBJECTIVE Filed Vitals:   06/16/13 2051 06/16/13 2140 06/17/13 0621 06/17/13 1107  BP:  98/69 108/85 102/69  Pulse:  100 71 69  Temp:  98.4 F (36.9 C) 97.5 F (36.4 C)   TempSrc:  Oral Oral   Resp:  18 18   Height:      Weight:   174 lb (78.926 kg)   SpO2: 98% 95% 96%     Intake/Output Summary (Last 24 hours) at 06/17/13 1301 Last data filed at 06/17/13 0800  Gross per 24 hour  Intake    480 ml  Output      0 ml  Net    480 ml   Filed Weights   06/15/13 0500 06/16/13 0700 06/17/13 0621  Weight: 167 lb 12.5 oz (76.105 kg) 169 lb 14.4 oz (77.066 kg) 174 lb (78.926 kg)    PHYSICAL EXAM General: Well developed, well nourished, female seems anxious. Head: Normocephalic, atraumatic.  Neck: Supple without bruits, JVD at 10 cm. Lungs:  Resp regular and unlabored, decreased BS bilateral bases, L>R. Heart: RRR, S1, S2, no S3, S4, Soft murmur; no rub. Abdomen: Soft, non-tender, non-distended, BS + x 4.  Extremities: No clubbing, cyanosis, trace R edema.  Neuro: Alert and oriented X 2. Moves all extremities spontaneously. Psych: Normal affect.  LABS: CBC:  Recent Labs  06/16/13 0522 06/17/13 0530  WBC 6.6 7.5  HGB 8.8* 8.8*  HCT 29.2* 29.6*  MCV 83.4 84.3  PLT 188 187   INR:  Recent Labs  06/17/13 0530  INR 3.21*   Basic Metabolic Panel:  Recent Labs  81/19/14 0522 06/17/13 0530  NA 141 139  K 4.7 4.8  CL 105 106  CO2 25 24  GLUCOSE 116* 106*  BUN 33* 32*  CREATININE 2.00* 1.69*  CALCIUM 7.9* 8.0*   BNP: Pro B Natriuretic peptide (BNP)  Date/Time Value Range Status  06/15/2013  4:35 AM 7917.0* 0 - 125 pg/mL Final  06/11/2013  4:40 AM 15417.0* 0 - 125 pg/mL Final    TELE: SR, PVCs and occasional pairs.         Radiology/Studies: Dg Chest 2 View 06/16/2013   CLINICAL DATA:  Shortness of breath, recurrent pleural effusions.  EXAM: CHEST  2 VIEW  COMPARISON:  06/14/2013 and 06/12/2013  FINDINGS: Right-sided PICC line is unchanged. Lungs are somewhat hypoinflated with worsening small to moderate pleural effusions left greater than right. There is worsening opacification over the left midlung which may be due to atelectasis or infection. There is stable cardiomegaly. There is calcification of the thoracoabdominal aorta. Posterior she fusion hardware is present over the upper lumbar spine unchanged. Mild anterior wedging of a lower thoracic vertebral body unchanged. Marland Kitchen  IMPRESSION: Small to moderate bilateral pleural effusions left greater than right with slight worsening. Opacification of the left midlung  slightly worse which may be due to atelectasis or infection.  Right-sided PICC line unchanged.  Stable cardiomegaly.   Electronically Signed   By: Elberta Fortisaniel  Boyle M.D.   On: 06/16/2013 08:53   Dg Chest Port 1 View 06/14/2013   CLINICAL DATA:  Reassess bilateral pleural effusions.  EXAM: PORTABLE CHEST - 1 VIEW  COMPARISON:  June 12, 2013  FINDINGS: There remain small to moderate-sized bilateral pleural effusions. These have not significantly changed since the earlier study. Basilar atelectasis versus infiltrate on the left is suspected. The cardiac silhouette remains enlarged. The pulmonary vascularity is not engorged. The pulmonary interstitial markings are mildly prominent  bilaterally. The PICC line via the right upper extremity has its tip in the region of the midportion of the SVC.  IMPRESSION: There persistent moderate-sized bilateral pleural effusions not greatly changed from the earlier study. Persistent confluent density at the left lung base suggest atelectasis or pneumonia. There is no significant pulmonary vascular congestion.   Electronically Signed   By: David  Swaz[place]   On: 06/14/2013 13:35     Current Medications:  . aspirin  81 mg Oral Daily  . atorvastatin  20 mg Oral QHS  . clonazePAM  0.5 mg Oral q morning - 10a  . clonazePAM  1 mg Oral QHS  . docusate sodium  100 mg Oral BID  . famotidine  20 mg Oral Daily  . furosemide  40 mg Oral BID  . insulin aspart  0-5 Units Subcutaneous QHS  . insulin aspart  0-9 Units Subcutaneous TID WC  . insulin detemir  7 Units Subcutaneous BID  . ipratropium-albuterol  3 mL Nebulization TID  . metoprolol tartrate  12.5 mg Oral BID  . senna-docusate  1 tablet Oral QHS  . traZODone  50 mg Oral QHS  . venlafaxine XR  75 mg Oral Daily  . Warfarin - Pharmacist Dosing Inpatient   Does not apply q1800      ASSESSMENT AND PLAN: 73 yo female with a history of CHF, right pleural effusion, PAF on coumadin, CAD, COPD, DM, GERD, dementia, CVA with right hemiparesis. The patient's EF in December 2014 was read and normal or mildly decreased, severe LVH, LA moderately dilated. Admitted 01/12 with acute diastolic CHF and  Effusion, s/p thoracentesis 01/12 with 1.2 L off. TCTS saw, pleurx cath not recommended at this time.    Acute diastolic heart failure - now on lasix 40 mg bid. Weight is up but ? Accuracy. Symptoms unchanged.    Paroxysmal atrial fibrillation - Rate is controlled on low-dose BB, continue    Chronic anticoagulation - coumadin therapeutic, per pharmacy   It looks like we are maintaining status quo with diuretics. Difficult to push harder with renal function and low BP. Stressed importance of sodium  restriction.  May be reaching maximal benefit of hospital Rx.Plan transfer to [facility] today. If symptoms worsen or Right effusion increases may need intermittent thoracentesis. Overall prognosis is poor.  Theron Aristaeter [facility] HospitalJordanMD,FACC 06/17/2013 1:01 PM

## 2013-07-22 ENCOUNTER — Emergency Department (HOSPITAL_COMMUNITY): Payer: Medicare Other

## 2013-07-22 ENCOUNTER — Inpatient Hospital Stay (HOSPITAL_COMMUNITY)
Admission: EM | Admit: 2013-07-22 | Discharge: 2013-07-27 | DRG: 291 | Disposition: A | Discharge status: Skilled Nursing Facility | Payer: Medicare Other | Attending: Internal Medicine | Admitting: Internal Medicine

## 2013-07-22 ENCOUNTER — Inpatient Hospital Stay (HOSPITAL_COMMUNITY): Payer: Medicare Other

## 2013-07-22 ENCOUNTER — Encounter (HOSPITAL_COMMUNITY): Payer: Self-pay | Admitting: Emergency Medicine

## 2013-07-22 DIAGNOSIS — R0902 Hypoxemia: Secondary | ICD-10-CM

## 2013-07-22 DIAGNOSIS — J90 Pleural effusion, not elsewhere classified: Secondary | ICD-10-CM | POA: Diagnosis present

## 2013-07-22 DIAGNOSIS — N183 Chronic kidney disease, stage 3 unspecified: Secondary | ICD-10-CM | POA: Diagnosis present

## 2013-07-22 DIAGNOSIS — I69959 Hemiplegia and hemiparesis following unspecified cerebrovascular disease affecting unspecified side: Secondary | ICD-10-CM | POA: Diagnosis not present

## 2013-07-22 DIAGNOSIS — I509 Heart failure, unspecified: Secondary | ICD-10-CM | POA: Diagnosis present

## 2013-07-22 DIAGNOSIS — I2510 Atherosclerotic heart disease of native coronary artery without angina pectoris: Secondary | ICD-10-CM | POA: Diagnosis present

## 2013-07-22 DIAGNOSIS — D649 Anemia, unspecified: Secondary | ICD-10-CM | POA: Diagnosis present

## 2013-07-22 DIAGNOSIS — Z794 Long term (current) use of insulin: Secondary | ICD-10-CM

## 2013-07-22 DIAGNOSIS — I69351 Hemiplegia and hemiparesis following cerebral infarction affecting right dominant side: Secondary | ICD-10-CM

## 2013-07-22 DIAGNOSIS — Z9981 Dependence on supplemental oxygen: Secondary | ICD-10-CM | POA: Diagnosis not present

## 2013-07-22 DIAGNOSIS — K219 Gastro-esophageal reflux disease without esophagitis: Secondary | ICD-10-CM | POA: Diagnosis present

## 2013-07-22 DIAGNOSIS — R4789 Other speech disturbances: Secondary | ICD-10-CM | POA: Diagnosis present

## 2013-07-22 DIAGNOSIS — J9600 Acute respiratory failure, unspecified whether with hypoxia or hypercapnia: Secondary | ICD-10-CM | POA: Diagnosis present

## 2013-07-22 DIAGNOSIS — R0602 Shortness of breath: Secondary | ICD-10-CM | POA: Diagnosis present

## 2013-07-22 DIAGNOSIS — Z7982 Long term (current) use of aspirin: Secondary | ICD-10-CM

## 2013-07-22 DIAGNOSIS — Z881 Allergy status to other antibiotic agents status: Secondary | ICD-10-CM

## 2013-07-22 DIAGNOSIS — Z79899 Other long term (current) drug therapy: Secondary | ICD-10-CM

## 2013-07-22 DIAGNOSIS — I5031 Acute diastolic (congestive) heart failure: Principal | ICD-10-CM | POA: Diagnosis present

## 2013-07-22 DIAGNOSIS — F0390 Unspecified dementia without behavioral disturbance: Secondary | ICD-10-CM | POA: Diagnosis present

## 2013-07-22 DIAGNOSIS — J441 Chronic obstructive pulmonary disease with (acute) exacerbation: Secondary | ICD-10-CM | POA: Diagnosis present

## 2013-07-22 DIAGNOSIS — Z7901 Long term (current) use of anticoagulants: Secondary | ICD-10-CM

## 2013-07-22 DIAGNOSIS — IMO0002 Reserved for concepts with insufficient information to code with codable children: Secondary | ICD-10-CM

## 2013-07-22 DIAGNOSIS — I480 Paroxysmal atrial fibrillation: Secondary | ICD-10-CM | POA: Diagnosis present

## 2013-07-22 DIAGNOSIS — J189 Pneumonia, unspecified organism: Secondary | ICD-10-CM

## 2013-07-22 DIAGNOSIS — R471 Dysarthria and anarthria: Secondary | ICD-10-CM | POA: Diagnosis present

## 2013-07-22 DIAGNOSIS — R0600 Dyspnea, unspecified: Secondary | ICD-10-CM

## 2013-07-22 DIAGNOSIS — E119 Type 2 diabetes mellitus without complications: Secondary | ICD-10-CM | POA: Diagnosis present

## 2013-07-22 DIAGNOSIS — I4891 Unspecified atrial fibrillation: Secondary | ICD-10-CM | POA: Diagnosis present

## 2013-07-22 DIAGNOSIS — J9611 Chronic respiratory failure with hypoxia: Secondary | ICD-10-CM

## 2013-07-22 DIAGNOSIS — I959 Hypotension, unspecified: Secondary | ICD-10-CM

## 2013-07-22 DIAGNOSIS — J9601 Acute respiratory failure with hypoxia: Secondary | ICD-10-CM | POA: Diagnosis present

## 2013-07-22 LAB — PRO B NATRIURETIC PEPTIDE: Pro B Natriuretic peptide (BNP): 8727 pg/mL — ABNORMAL HIGH (ref 0–125)

## 2013-07-22 LAB — CBC
HEMATOCRIT: 29.9 % — AB (ref 36.0–46.0)
HEMATOCRIT: 31.7 % — AB (ref 36.0–46.0)
Hemoglobin: 9.2 g/dL — ABNORMAL LOW (ref 12.0–15.0)
Hemoglobin: 9.5 g/dL — ABNORMAL LOW (ref 12.0–15.0)
MCH: 24.6 pg — ABNORMAL LOW (ref 26.0–34.0)
MCH: 24.1 pg — ABNORMAL LOW (ref 26.0–34.0)
MCHC: 30.8 g/dL (ref 30.0–36.0)
MCHC: 30 g/dL (ref 30.0–36.0)
MCV: 79.9 fL (ref 78.0–100.0)
MCV: 80.5 fL (ref 78.0–100.0)
Platelets: 210 10*3/uL (ref 150–400)
Platelets: 221 10*3/uL (ref 150–400)
RBC: 3.74 MIL/uL — AB (ref 3.87–5.11)
RBC: 3.94 MIL/uL (ref 3.87–5.11)
RDW: 17.4 % — ABNORMAL HIGH (ref 11.5–15.5)
RDW: 17.6 % — ABNORMAL HIGH (ref 11.5–15.5)
WBC: 9.5 10*3/uL (ref 4.0–10.5)
WBC: 9.6 10*3/uL (ref 4.0–10.5)

## 2013-07-22 LAB — BASIC METABOLIC PANEL
BUN: 36 mg/dL — ABNORMAL HIGH (ref 6–23)
CHLORIDE: 107 meq/L (ref 96–112)
CO2: 24 meq/L (ref 19–32)
CREATININE: 1.84 mg/dL — AB (ref 0.50–1.10)
Calcium: 9.1 mg/dL (ref 8.4–10.5)
GFR calc Af Amer: 31 mL/min — ABNORMAL LOW (ref >90)
GFR calc non Af Amer: 26 mL/min — ABNORMAL LOW (ref >90)
Glucose, Bld: 129 mg/dL — ABNORMAL HIGH (ref 70–99)
Potassium: 4.7 mEq/L (ref 3.7–5.3)
Sodium: 143 mEq/L (ref 137–147)

## 2013-07-22 LAB — TROPONIN I: Troponin I: <0.3 ng/mL (ref <0.30)

## 2013-07-22 LAB — I-STAT ARTERIAL BLOOD GAS, ED
ACID-BASE DEFICIT: 5 mmol/L — AB (ref 0.0–2.0)
Bicarbonate: 19.7 mEq/L — ABNORMAL LOW (ref 20.0–24.0)
O2 Saturation: 83 %
PH ART: 7.61 — AB (ref 7.350–7.450)
PRESSURE CONTROL: 1 cmH2O
TCO2: 21 mmol/L (ref 0–100)
pCO2 arterial: 16.7 mmHg — CL (ref 35.0–45.0)
pO2, Arterial: 16 mmHg — CL (ref 80.0–100.0)

## 2013-07-22 LAB — CREATININE, SERUM
Creatinine, Ser: 1.8 mg/dL — ABNORMAL HIGH (ref 0.50–1.10)
GFR, EST AFRICAN AMERICAN: 32 mL/min — AB (ref >90)
GFR, EST NON AFRICAN AMERICAN: 27 mL/min — AB (ref >90)

## 2013-07-22 LAB — PROTIME-INR
INR: 2.23 — ABNORMAL HIGH (ref 0.00–1.49)
PROTHROMBIN TIME: 24 s — AB (ref 11.6–15.2)

## 2013-07-22 LAB — GLUCOSE, CAPILLARY
Glucose-Capillary: 280 mg/dL — ABNORMAL HIGH (ref 70–99)
Glucose-Capillary: 224 mg/dL — ABNORMAL HIGH (ref 70–99)

## 2013-07-22 LAB — I-STAT TROPONIN, ED: Troponin i, poc: 0.13 ng/mL (ref 0.00–0.08)

## 2013-07-22 MED ORDER — SPIRONOLACTONE 12.5 MG HALF TABLET
12.5000 mg | ORAL_TABLET | Freq: Every day | ORAL | Status: DC
  Administered 2013-07-22: 12.5 mg via ORAL
  Filled 2013-07-22: qty 1

## 2013-07-22 MED ORDER — TRAZODONE HCL 50 MG PO TABS
50.0000 mg | ORAL_TABLET | Freq: Every day | ORAL | Status: DC
  Administered 2013-07-22 – 2013-07-26 (×5): 50 mg via ORAL
  Filled 2013-07-22 (×6): qty 1

## 2013-07-22 MED ORDER — FAMOTIDINE 20 MG PO TABS
20.0000 mg | ORAL_TABLET | Freq: Two times a day (BID) | ORAL | Status: DC
  Administered 2013-07-22 – 2013-07-24 (×4): 20 mg via ORAL
  Filled 2013-07-22 (×5): qty 1

## 2013-07-22 MED ORDER — IPRATROPIUM-ALBUTEROL 0.5-2.5 (3) MG/3ML IN SOLN: 3.0000 mL | RESPIRATORY_TRACT | Status: DC

## 2013-07-22 MED ORDER — FUROSEMIDE 10 MG/ML IJ SOLN
20.0000 mg | Freq: Two times a day (BID) | INTRAMUSCULAR | Status: DC
  Administered 2013-07-22 – 2013-07-25 (×6): 20 mg via INTRAVENOUS
  Administered 2013-07-25: 08:00:00 via INTRAVENOUS
  Administered 2013-07-26: 20 mg via INTRAVENOUS
  Filled 2013-07-22 (×9): qty 2

## 2013-07-22 MED ORDER — ALBUTEROL (5 MG/ML) CONTINUOUS INHALATION SOLN
15.0000 mg/h | INHALATION_SOLUTION | Freq: Once | RESPIRATORY_TRACT | Status: AC
  Administered 2013-07-22: 15 mg/h via RESPIRATORY_TRACT
  Filled 2013-07-22: qty 20

## 2013-07-22 MED ORDER — OXYCODONE HCL 5 MG PO TABS: 5.0000 mg | ORAL_TABLET | ORAL | Status: DC | PRN

## 2013-07-22 MED ORDER — INSULIN ASPART 100 UNIT/ML ~~LOC~~ SOLN
0.0000 [IU] | Freq: Three times a day (TID) | SUBCUTANEOUS | Status: DC
  Administered 2013-07-22: 7 [IU] via SUBCUTANEOUS
  Administered 2013-07-23: 4 [IU] via SUBCUTANEOUS
  Administered 2013-07-23: 7 [IU] via SUBCUTANEOUS
  Administered 2013-07-23: 4 [IU] via SUBCUTANEOUS
  Administered 2013-07-24 (×2): 7 [IU] via SUBCUTANEOUS
  Administered 2013-07-24: 4 [IU] via SUBCUTANEOUS
  Administered 2013-07-25: 7 [IU] via SUBCUTANEOUS
  Administered 2013-07-25: 11 [IU] via SUBCUTANEOUS
  Administered 2013-07-25: 4 [IU] via SUBCUTANEOUS
  Administered 2013-07-26: 3 [IU] via SUBCUTANEOUS
  Administered 2013-07-26: 7 [IU] via SUBCUTANEOUS
  Administered 2013-07-27: 4 [IU] via SUBCUTANEOUS
  Administered 2013-07-27: 7 [IU] via SUBCUTANEOUS

## 2013-07-22 MED ORDER — ASPIRIN 81 MG PO CHEW
324.0000 mg | CHEWABLE_TABLET | Freq: Once | ORAL | Status: AC
  Administered 2013-07-22: 324 mg via ORAL
  Filled 2013-07-22: qty 4

## 2013-07-22 MED ORDER — SENNA-DOCUSATE SODIUM 8.6-50 MG PO TABS: 1.0000 | ORAL_TABLET | Freq: Every day | ORAL | Status: DC | PRN

## 2013-07-22 MED ORDER — CLONAZEPAM 1 MG PO TABS
1.0000 mg | ORAL_TABLET | Freq: Every day | ORAL | Status: DC
  Administered 2013-07-22 – 2013-07-26 (×5): 1 mg via ORAL
  Filled 2013-07-22 (×2): qty 1
  Filled 2013-07-22 (×2): qty 2
  Filled 2013-07-22 (×2): qty 1

## 2013-07-22 MED ORDER — NITROGLYCERIN 0.2 MG/HR TD PT24
0.2000 mg | MEDICATED_PATCH | Freq: Every day | TRANSDERMAL | Status: DC
  Administered 2013-07-22 – 2013-07-27 (×6): 0.2 mg via TRANSDERMAL
  Filled 2013-07-22 (×6): qty 1

## 2013-07-22 MED ORDER — ATORVASTATIN CALCIUM 20 MG PO TABS
20.0000 mg | ORAL_TABLET | Freq: Every day | ORAL | Status: DC
  Administered 2013-07-22 – 2013-07-26 (×5): 20 mg via ORAL
  Filled 2013-07-22 (×6): qty 1

## 2013-07-22 MED ORDER — WARFARIN - PHARMACIST DOSING INPATIENT
Freq: Every day | Status: DC
  Administered 2013-07-23: 18:00:00

## 2013-07-22 MED ORDER — WARFARIN SODIUM 2 MG PO TABS
2.0000 mg | ORAL_TABLET | ORAL | Status: DC
  Administered 2013-07-23 – 2013-07-24 (×2): 2 mg via ORAL
  Filled 2013-07-22 (×3): qty 1

## 2013-07-22 MED ORDER — DOCUSATE SODIUM 100 MG PO CAPS
100.0000 mg | ORAL_CAPSULE | Freq: Two times a day (BID) | ORAL | Status: DC
  Administered 2013-07-23 – 2013-07-27 (×6): 100 mg via ORAL
  Filled 2013-07-22 (×11): qty 1

## 2013-07-22 MED ORDER — IPRATROPIUM BROMIDE 0.02 % IN SOLN
1.5000 mg | RESPIRATORY_TRACT | Status: AC
  Administered 2013-07-22: 1.5 mg via RESPIRATORY_TRACT

## 2013-07-22 MED ORDER — CLONAZEPAM 0.5 MG PO TABS
0.5000 mg | ORAL_TABLET | Freq: Every morning | ORAL | Status: DC
  Administered 2013-07-23 – 2013-07-27 (×4): 0.5 mg via ORAL
  Filled 2013-07-22 (×4): qty 1

## 2013-07-22 MED ORDER — SENNOSIDES-DOCUSATE SODIUM 8.6-50 MG PO TABS
1.0000 | ORAL_TABLET | Freq: Every day | ORAL | Status: DC | PRN
  Filled 2013-07-22: qty 1

## 2013-07-22 MED ORDER — INSULIN ASPART 100 UNIT/ML ~~LOC~~ SOLN
0.0000 [IU] | Freq: Every day | SUBCUTANEOUS | Status: DC
  Administered 2013-07-22 – 2013-07-23 (×2): 3 [IU] via SUBCUTANEOUS

## 2013-07-22 MED ORDER — ALBUTEROL SULFATE (2.5 MG/3ML) 0.083% IN NEBU: 2.5000 mg | INHALATION_SOLUTION | RESPIRATORY_TRACT | Status: DC | PRN

## 2013-07-22 MED ORDER — WARFARIN SODIUM 4 MG PO TABS
4.0000 mg | ORAL_TABLET | ORAL | Status: DC
  Administered 2013-07-23: 4 mg via ORAL
  Filled 2013-07-22 (×2): qty 1

## 2013-07-22 MED ORDER — ENOXAPARIN SODIUM 40 MG/0.4ML ~~LOC~~ SOLN
40.0000 mg | Freq: Every day | SUBCUTANEOUS | Status: DC
  Administered 2013-07-22: 40 mg via SUBCUTANEOUS
  Filled 2013-07-22: qty 0.4

## 2013-07-22 MED ORDER — METHYLPREDNISOLONE SODIUM SUCC 125 MG IJ SOLR
80.0000 mg | Freq: Four times a day (QID) | INTRAMUSCULAR | Status: DC
  Administered 2013-07-22 – 2013-07-24 (×9): 80 mg via INTRAVENOUS
  Administered 2013-07-25: 12:00:00 via INTRAVENOUS
  Administered 2013-07-25 (×2): 80 mg via INTRAVENOUS
  Filled 2013-07-22 (×15): qty 1.28
  Filled 2013-07-22: qty 2
  Filled 2013-07-22: qty 1.28

## 2013-07-22 MED ORDER — METHYLPREDNISOLONE SODIUM SUCC 125 MG IJ SOLR
125.0000 mg | Freq: Once | INTRAMUSCULAR | Status: AC
  Administered 2013-07-22: 125 mg via INTRAVENOUS
  Filled 2013-07-22: qty 2

## 2013-07-22 MED ORDER — SODIUM CHLORIDE 0.9 % IJ SOLN
3.0000 mL | Freq: Two times a day (BID) | INTRAMUSCULAR | Status: DC
  Administered 2013-07-22 – 2013-07-26 (×6): 3 mL via INTRAVENOUS

## 2013-07-22 MED ORDER — VENLAFAXINE HCL ER 75 MG PO CP24
75.0000 mg | ORAL_CAPSULE | Freq: Every day | ORAL | Status: DC
  Administered 2013-07-22 – 2013-07-27 (×6): 75 mg via ORAL
  Filled 2013-07-22 (×6): qty 1

## 2013-07-22 MED ORDER — IPRATROPIUM BROMIDE 0.02 % IN SOLN
RESPIRATORY_TRACT | Status: AC
  Administered 2013-07-22: 1.5 mg via RESPIRATORY_TRACT
  Filled 2013-07-22: qty 7.5

## 2013-07-22 MED ORDER — SPIRONOLACTONE 25 MG PO TABS
25.0000 mg | ORAL_TABLET | Freq: Every day | ORAL | Status: DC
  Administered 2013-07-23 – 2013-07-24 (×2): 25 mg via ORAL
  Filled 2013-07-22 (×2): qty 1

## 2013-07-22 MED ORDER — HYDROCODONE-ACETAMINOPHEN 5-325 MG PO TABS
1.0000 | ORAL_TABLET | Freq: Three times a day (TID) | ORAL | Status: DC | PRN
  Administered 2013-07-22: 1 via ORAL
  Filled 2013-07-22: qty 1

## 2013-07-22 MED ORDER — ADULT MULTIVITAMIN W/MINERALS CH
1.0000 | ORAL_TABLET | Freq: Every day | ORAL | Status: DC
  Administered 2013-07-22 – 2013-07-27 (×6): 1 via ORAL
  Filled 2013-07-22 (×6): qty 1

## 2013-07-22 MED ORDER — IPRATROPIUM BROMIDE 0.02 % IN SOLN
0.5000 mg | Freq: Four times a day (QID) | RESPIRATORY_TRACT | Status: DC
Start: 2013-07-22 — End: 2013-07-24
  Administered 2013-07-22 – 2013-07-24 (×7): 0.5 mg via RESPIRATORY_TRACT
  Filled 2013-07-22 (×7): qty 2.5

## 2013-07-22 MED ORDER — ACETAMINOPHEN 325 MG PO TABS: 650.0000 mg | ORAL_TABLET | ORAL | Status: DC | PRN

## 2013-07-22 MED ORDER — ONDANSETRON HCL 4 MG/2ML IJ SOLN: 4.0000 mg | Freq: Four times a day (QID) | INTRAMUSCULAR | Status: DC | PRN

## 2013-07-22 MED ORDER — METOPROLOL TARTRATE 12.5 MG HALF TABLET
12.5000 mg | ORAL_TABLET | Freq: Every day | ORAL | Status: DC
  Administered 2013-07-22 – 2013-07-27 (×6): 12.5 mg via ORAL
  Filled 2013-07-22 (×6): qty 1

## 2013-07-22 MED ORDER — SENNOSIDES-DOCUSATE SODIUM 8.6-50 MG PO TABS
1.0000 | ORAL_TABLET | Freq: Every evening | ORAL | Status: DC | PRN
  Filled 2013-07-22: qty 1

## 2013-07-22 MED ORDER — ALUM & MAG HYDROXIDE-SIMETH 200-200-20 MG/5ML PO SUSP: 30.0000 mL | Freq: Four times a day (QID) | ORAL | Status: DC | PRN

## 2013-07-22 MED ORDER — POTASSIUM CHLORIDE IN NACL 20-0.9 MEQ/L-% IV SOLN
INTRAVENOUS | Status: DC
Start: 2013-07-22 — End: 2013-07-24
  Administered 2013-07-22: 10 mL/h via INTRAVENOUS
  Filled 2013-07-22 (×2): qty 1000

## 2013-07-22 MED ORDER — THERA M PLUS PO TABS: 1.0000 | ORAL_TABLET | Freq: Every day | ORAL | Status: DC

## 2013-07-22 MED ORDER — WARFARIN SODIUM 2 MG PO TABS: 2.0000 mg | ORAL_TABLET | Freq: Every day | ORAL | Status: DC

## 2013-07-22 MED ORDER — ALBUTEROL (5 MG/ML) CONTINUOUS INHALATION SOLN
15.0000 mg/h | INHALATION_SOLUTION | Freq: Once | RESPIRATORY_TRACT | Status: AC
  Administered 2013-07-22: 15 mg/h via RESPIRATORY_TRACT

## 2013-07-22 MED ORDER — INSULIN DETEMIR 100 UNIT/ML ~~LOC~~ SOLN
7.0000 [IU] | Freq: Two times a day (BID) | SUBCUTANEOUS | Status: DC
  Administered 2013-07-22 – 2013-07-27 (×10): 7 [IU] via SUBCUTANEOUS
  Filled 2013-07-22 (×11): qty 0.07

## 2013-07-22 MED ORDER — SENNOSIDES-DOCUSATE SODIUM 8.6-50 MG PO TABS
1.0000 | ORAL_TABLET | Freq: Every day | ORAL | Status: DC
  Administered 2013-07-23 – 2013-07-26 (×3): 1 via ORAL
  Filled 2013-07-22 (×5): qty 1

## 2013-07-22 MED ORDER — ONDANSETRON HCL 4 MG PO TABS
4.0000 mg | ORAL_TABLET | Freq: Four times a day (QID) | ORAL | Status: DC | PRN
Start: 2013-07-22 — End: 2013-07-27

## 2013-07-22 MED ORDER — ASPIRIN 81 MG PO CHEW
324.0000 mg | CHEWABLE_TABLET | Freq: Once | ORAL | Status: DC
  Filled 2013-07-22: qty 4

## 2013-07-22 MED ORDER — FUROSEMIDE 10 MG/ML IJ SOLN
40.0000 mg | Freq: Once | INTRAMUSCULAR | Status: AC
  Administered 2013-07-22: 40 mg via INTRAVENOUS
  Filled 2013-07-22: qty 4

## 2013-07-22 NOTE — ED Notes (Signed)
Per discussion with MD.  RT will wait until 30 minutes after continuous neb is finished to stick for ABG to get an accurate O2 reading.

## 2013-07-22 NOTE — ED Notes (Signed)
Pt from NH with SOb since last night. Pt for EMS sounds diminshed in bases. O2 sat initially 75%. Placed on NRB and sat upright then weaned to [place] at 4L. Normally wears 2 L. Pt with hx of CHF. Right arm extremity restriction due to old CVA. Nitro patch to right chest.

## 2013-07-22 NOTE — H&P (Signed)
Ariana [name] is an 73 y.o. female.   Chief Complaint: can't breath HPI:  The patient is a 73 year old woman with multiple medical problems. She has been admitted several times recently because of acute worsening of dyspnea on exertion. She has been a patient at our skilled nursing facility since early January 2015 because of deconditioning, congestive heart failure, and COPD. She had been doing reasonably well until last night when she developed progressively worsening dyspnea at rest with worsening orthopnea. She also complained of vague chest discomfort. At the nursing home her pulse oxygen saturation levels were in the 70s on nasal cannula oxygen at 2 L per minute and only increased to the mid-80s with 4 L per minute of oxygen. She also had hypotension and was transferred to this emergency room for further evaluation. In the emergency room, higher dose oxygen was able to raise her pulse oxygen saturation levels to about 92%. She also was treated with IV corticosteroids and nebulizers. She does not feel that the dyspnea is much improved despite these measures. Lately she has not had a productive cough, fever, chills, or exertional chest discomfort. She has a history of bilateral pleural effusions are required therapeutic thoracenteses. In December 2014 she had a echocardiogram that showed normal left ventricular systolic function with severe left ventricular hypertrophy and no significant valvular heart disease. In our skilled nursing facility she had a DO NOT RESUSCITATE order.  Past Medical History  Diagnosis Date  . Pneumonia   . Pleural effusion   . CHF (congestive heart failure)   . Paroxysmal atrial fibrillation   . COPD (chronic obstructive pulmonary disease)   . Stroke   . Dysarthria due to cerebrovascular accident   . 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 Coronary artery disease   . Diabetes mellitus without complication   . GERD  (gastroesophageal reflux disease)   . Dementia     Medications Prior to Admission  Medication Sig Dispense Refill  . acetaminophen (TYLENOL) 325 MG tablet Take 650 mg by mouth every 4 (four) hours as needed (for pain).      Marland Kitchen albuterol (PROVENTIL) (2.5 MG/3ML) 0.083% nebulizer solution Take 2.5 mg by nebulization every 6 (six) hours as needed for wheezing or shortness of breath.      Marland Kitchen aspirin (ASPIRIN ADULT LOW STRENGTH) 81 MG chewable tablet Chew 81 mg by mouth daily.      Marland Kitchen atorvastatin (LIPITOR) 20 MG tablet Take 20 mg by mouth at bedtime.      . clonazePAM (KLONOPIN) 0.5 MG tablet Take 1 tablet (0.5 mg total) by mouth every morning.  15 tablet  0  . clonazePAM (KLONOPIN) 1 MG tablet Take 1 mg by mouth at bedtime.      . docusate sodium (COLACE) 100 MG capsule Take 100 mg by mouth 2 (two) times daily.      . famotidine (PEPCID) 20 MG tablet Take 20 mg by mouth 2 (two) times daily.      . furosemide (LASIX) 80 MG tablet Take 80 mg by mouth daily.      Marland Kitchen HYDROcodone-acetaminophen (NORCO/VICODIN) 5-325 MG per tablet Take 1 tablet by mouth 3 (three) times daily as needed for moderate pain.      Marland Kitchen insulin aspart (NOVOLOG) 100 UNIT/ML injection -9 Units, Subcutaneous, 3 times daily with meals CBG < 70: implement hypoglycemia protocol CBG 70 - 120: 0 units CBG 121 - 150: 1 unit CBG 151 - 200: 2 units CBG 201 -  250: 3 units CBG 251 - 300: 5 units CBG 301 - 350: 7 units CBG 351 - 400: 9 units CBG > 400: call MD  10 mL  11  . insulin detemir (LEVEMIR) 100 UNIT/ML injection Inject 7 Units into the skin 2 (two) times daily.      . metoprolol tartrate (LOPRESSOR) 25 MG tablet Take 12.5 mg by mouth daily.       . Multiple Vitamins-Minerals (MULTIVITAMINS THER. W/MINERALS) TABS tablet Take 1 tablet by mouth daily.      . nitroGLYCERIN (NITRODUR - DOSED IN MG/24 HR) 0.2 mg/hr patch Place 0.2 mg onto the skin daily.      . potassium chloride (K-DUR,KLOR-CON) 10 MEQ tablet Take 10 mEq by mouth daily.       Marland Kitchen senna-docusate (SENOKOT-S) 8.6-50 MG per tablet Take 1 tablet by mouth at bedtime.      . sennosides-docusate sodium (SENOKOT-S) 8.6-50 MG tablet Take 1 tablet by mouth daily as needed for constipation ((in addition to daily scheduled dose)).      Marland Kitchen spironolactone (ALDACTONE) 25 MG tablet Take 12.5 mg by mouth daily.      . traZODone (DESYREL) 50 MG tablet Take 50 mg by mouth at bedtime.      Marland Kitchen venlafaxine XR (EFFEXOR-XR) 75 MG 24 hr capsule Take 75 mg by mouth daily.      Marland Kitchen warfarin (COUMADIN) 2 MG tablet Take 2-4 mg by mouth daily. Take $RemoveBef'2mg'laJPZrNHGZ$  Every Mon, Wed, Fri, and Sat. Take $RemoveB'4mg'GhnQwrww$  all other days        ADDITIONAL HOME MEDICATIONS: No additional home medications.  PHYSICIANS INVOLVED IN CARE: [facility]  Past Surgical History  Procedure Laterality Date  . Thoracostomy    . Thoracentesis     remote lumbar spine surgery, remote left leg anterior compartment surgery, remote exploratory laparotomy  History reviewed. No pertinent family history. There is no family history of diabetes mellitus, colon cancer, breast cancer, or premature cardiovascular disease  Social History:  reports that she has never smoked. She does not have any smokeless tobacco history on file. She reports that she does not drink alcohol or use illicit drugs. she is divorced and live independently prior to hospitalization. She has a son, Sherlene Shams, who lives in [place]. She has a history of tobacco abuse it was stopped about 10 years ago no history of abuse.  Allergies:  Allergies  Allergen Reactions  . Baclofen Other (See Comments)    Reaction unknown  . Clindamycin/Lincomycin Other (See Comments)    Reaction unknown     ROS: anemia, ankle swelling, arthritis, chest pain, diabetes, emphysema, heart murmur, heart palpitation, kidney disease and shortness of breath  PHYSICAL EXAM: Blood pressure 85/55, pulse 105, temperature 98.7 F (37.1 C), temperature source Oral, resp. rate 32, SpO2  92.00%. In general, the patient is a mildly overweight white woman who had mild tachypnea on nasal cannula oxygen while sitting up in a chair. She has mild dysarthria but makes it difficult to obtain any recent review of systems. HEENT exam was within normal limits, neck was supple without jugular venous distention, chest had decreased breath sounds in the bilateral lung bases halfway up from the base. Heart had an irregularly irregular rhythm without significant murmur or gallop, abdomen had normal bowel sounds and no hepatosplenomegaly or tenderness, extremities were with bilateral 1+ pitting edema of the legs. She was alert and could give some interval history, albeit with mild dysarthria. She was able to move all extremities somewhat.  Results for orders placed during the hospital encounter of 07/22/13 (from the past 48 hour(s))  CBC     Status: Abnormal   Collection Time    07/22/13  9:36 AM      Result Value Ref Range   WBC 9.5  4.0 - 10.5 K/uL   RBC 3.74 (*) 3.87 - 5.11 MIL/uL   Hemoglobin 9.2 (*) 12.0 - 15.0 g/dL   HCT 29.9 (*) 36.0 - 46.0 %   MCV 79.9  78.0 - 100.0 fL   MCH 24.6 (*) 26.0 - 34.0 pg   MCHC 30.8  30.0 - 36.0 g/dL   RDW 17.4 (*) 11.5 - 15.5 %   Platelets 210  150 - 400 K/uL  BASIC METABOLIC PANEL     Status: Abnormal   Collection Time    07/22/13  9:36 AM      Result Value Ref Range   Sodium 143  137 - 147 mEq/L   Potassium 4.7  3.7 - 5.3 mEq/L   Chloride 107  96 - 112 mEq/L   CO2 24  19 - 32 mEq/L   Glucose, Bld 129 (*) 70 - 99 mg/dL   BUN 36 (*) 6 - 23 mg/dL   Creatinine, Ser 1.84 (*) 0.50 - 1.10 mg/dL   Calcium 9.1  8.4 - 10.5 mg/dL   GFR calc non Af Amer 26 (*) >90 mL/min   GFR calc Af Amer 31 (*) >90 mL/min   Comment: (NOTE)     The eGFR has been calculated using the CKD EPI equation.     This calculation has not been validated in all clinical situations.     eGFR's persistently <90 mL/min signify possible Chronic Kidney     Disease.  PRO B NATRIURETIC  PEPTIDE     Status: Abnormal   Collection Time    07/22/13  9:36 AM      Result Value Ref Range   Pro B Natriuretic peptide (BNP) 8727.0 (*) 0 - 125 pg/mL  I-STAT TROPOININ, ED     Status: Abnormal   Collection Time    07/22/13  9:52 AM      Result Value Ref Range   Troponin i, poc 0.13 (*) 0.00 - 0.08 ng/mL   Comment NOTIFIED PHYSICIAN     Comment 3            Comment: Due to the release kinetics of cTnI,     a negative result within the first hours     of the onset of symptoms does not rule out     myocardial infarction with certainty.     If myocardial infarction is still suspected,     repeat the test at appropriate intervals.  TROPONIN I     Status: None   Collection Time    07/22/13 10:07 AM      Result Value Ref Range   Troponin I <0.30  <0.30 ng/mL   Comment:            Due to the release kinetics of cTnI,     a negative result within the first hours     of the onset of symptoms does not rule out     myocardial infarction with certainty.     If myocardial infarction is still suspected,     repeat the test at appropriate intervals.  I-STAT ARTERIAL BLOOD GAS, ED     Status: Abnormal   Collection Time    07/22/13 12:18 PM  Result Value Ref Range   Pressure control 1     pH, Arterial 7.610 (*) 7.350 - 7.450   pCO2 arterial 16.7 (*) 35.0 - 45.0 mmHg   pO2, Arterial 16.0 (*) 80.0 - 100.0 mmHg   Bicarbonate 19.7 (*) 20.0 - 24.0 mEq/L   TCO2 21  0 - 100 mmol/L   O2 Saturation 83.0     Acid-base deficit 5.0 (*) 0.0 - 2.0 mmol/L   Patient temperature 21.0 C     Collection site [facility]     Sample type ARTERIAL     Comment NOTIFIED PHYSICIAN    GLUCOSE, CAPILLARY     Status: Abnormal   Collection Time    07/22/13  5:33 PM      Result Value Ref Range   Glucose-Capillary 280 (*) 70 - 99 mg/dL   Comment 1 Notify RN    CBC     Status: Abnormal   Collection Time    07/22/13  6:32 PM      Result Value Ref Range   WBC 9.6  4.0 - 10.5 K/uL   RBC 3.94  3.87 - 5.11 MIL/uL    Hemoglobin 9.5 (*) 12.0 - 15.0 g/dL   HCT 31.7 (*) 36.0 - 46.0 %   MCV 80.5  78.0 - 100.0 fL   MCH 24.1 (*) 26.0 - 34.0 pg   MCHC 30.0  30.0 - 36.0 g/dL   RDW 17.6 (*) 11.5 - 15.5 %   Platelets 221  150 - 400 K/uL   Dg Chest Portable 1 View  07/22/2013   CLINICAL DATA:  SHORTNESS OF BREATH SHORTNESS OF BREATH  EXAM: PORTABLE CHEST - 1 VIEW  COMPARISON:  DG CHEST 2 VIEW dated 06/16/2013  FINDINGS: The right-sided PICC line has been removed in the interim. Diffuse areas of increased density project within the lung bases with a consolidative component along the lateral peripheral left lung base. There is prominence of the interstitial markings, peribronchial cuffing, and cephalization. The cardiac silhouette is enlarged. S-shaped scoliosis appreciated within the thoracolumbar spine. The osseous structures otherwise unremarkable. Atherosclerotic calcifications identified within the aorta and axillary vessels.  IMPRESSION: Interstitial infiltrate likely reflecting pulmonary edema. Asymmetric edema versus nonedematous infiltrates within the lung bases as well as likely a component of effusions left greater than right.   Electronically Signed   By: Margaree Mackintosh M.D.   On: 07/22/2013 10:33     Assessment/Plan #1 Dyspnea:  Likely multifactorial. It appears that the primary cause of dyspnea is development of bilateral pleural effusions (right greater than left) from acute congestive heart failure with diastolic dysfunction. She may have a component of COPD contributing to her dyspnea as well. Unfortunately, she's running a somewhat low blood pressure which makes it difficult to aggressively treat her with diuretics and other cardiac medicines. We will add additional medication to help diurese her. In addition we'll obtain a bilateral chest ultrasound and consider therapeutic thoracenteses. We will also treat her with cortisteroids and nebs for COPD since her BNP is only mildly increased from her  pre-discharge BNP and a component of her dyspnea could be from COPD.  #2 DM2: stable on SSI which will be continued. #3 Atrial Fibrillation: stable on coumadin and rate controlling agents.  Kiran Lapine G 07/22/2013, 6:48 PM

## 2013-07-22 NOTE — ED Provider Notes (Signed)
TIME SEEN: 10:00 AM  CHIEF COMPLAINT: Chest pain, shortness of breath   HPI: Patient is a 73 y.o. F with history of pneumonia, CHF, atrial fibrillation, COPD, stroke with dysarthria and right-sided hemiparesis, coronary artery disease, dementia, diabetes who presents emergency room with shortness of breath. History is limited given patient's significant dysarthria and dementia. She states this has been going on for "quite a while" but progressively got worse over the last several days. She states her shortness of breath is worse when lying flat. She is not able to work secondary to her right-sided hemiparesis. She is also complaining of chest pain but is unable to describe this any further. Patient is on 2 L of oxygen chronically. Upon EMS arrival, patient's oxygen level was in the 70s on her normal 2 L. She was given a nitroglycerin patch and oxygen was turned up to 4 L and her sats are in the 92-93% range. She denies that she's had any fever or cough. She denies that she's had any lower extremity swelling or pain.  ROS: See HPI Constitutional: no fever  Eyes: no drainage  ENT: no runny nose   Cardiovascular:  chest pain  Resp:  SOB  GI: no vomiting GU: no dysuria Integumentary: no rash  Allergy: no hives  Musculoskeletal: no leg swelling  Neurological: no slurred speech ROS otherwise negative  PAST MEDICAL HISTORY/PAST SURGICAL HISTORY:  Past Medical History  Diagnosis Date  . Pneumonia   . Pleural effusion   . CHF (congestive heart failure)   . Paroxysmal atrial fibrillation   . COPD (chronic obstructive pulmonary disease)   . Stroke   . Dysarthria due to cerebrovascular accident   . 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 Coronary artery disease   . Diabetes mellitus without complication   . GERD (gastroesophageal reflux disease)   . Dementia     MEDICATIONS:  Prior to Admission medications   Medication Sig Start Date End Date  Taking? Authorizing Provider  acetaminophen (TYLENOL) 325 MG tablet Take 650 mg by mouth every 4 (four) hours as needed (for pain).   Yes Historical Provider, MD  albuterol (PROVENTIL) (2.5 MG/3ML) 0.083% nebulizer solution Take 2.5 mg by nebulization every 6 (six) hours as needed for wheezing or shortness of breath.   Yes Historical Provider, MD  aspirin (ASPIRIN ADULT LOW STRENGTH) 81 MG chewable tablet Chew 81 mg by mouth daily.   Yes Historical Provider, MD  atorvastatin (LIPITOR) 20 MG tablet Take 20 mg by mouth at bedtime.   Yes Historical Provider, MD  clonazePAM (KLONOPIN) 0.5 MG tablet Take 1 tablet (0.5 mg total) by mouth every morning. 06/17/13  Yes Shanker Levora Dredge, MD  clonazePAM (KLONOPIN) 1 MG tablet Take 1 mg by mouth at bedtime.   Yes Historical Provider, MD  docusate sodium (COLACE) 100 MG capsule Take 100 mg by mouth 2 (two) times daily.   Yes Historical Provider, MD  famotidine (PEPCID) 20 MG tablet Take 20 mg by mouth 2 (two) times daily.   Yes Historical Provider, MD  furosemide (LASIX) 80 MG tablet Take 80 mg by mouth daily.   Yes Historical Provider, MD  HYDROcodone-acetaminophen (NORCO/VICODIN) 5-325 MG per tablet Take 1 tablet by mouth 3 (three) times daily as needed for moderate pain.   Yes Historical Provider, MD  insulin aspart (NOVOLOG) 100 UNIT/ML injection -9 Units, Subcutaneous, 3 times daily with meals CBG < 70: implement hypoglycemia protocol CBG 70 - 120: 0 units CBG  121 - 150: 1 unit CBG 151 - 200: 2 units CBG 201 - 250: 3 units CBG 251 - 300: 5 units CBG 301 - 350: 7 units CBG 351 - 400: 9 units CBG > 400: call MD 06/17/13  Yes Shanker Levora DredgeM Ghimire, MD  insulin detemir (LEVEMIR) 100 UNIT/ML injection Inject 7 Units into the skin 2 (two) times daily.   Yes Historical Provider, MD  metoprolol tartrate (LOPRESSOR) 25 MG tablet Take 12.5 mg by mouth daily.    Yes Historical Provider, MD  Multiple Vitamins-Minerals (MULTIVITAMINS THER. W/MINERALS) TABS tablet Take  1 tablet by mouth daily.   Yes Historical Provider, MD  nitroGLYCERIN (NITRODUR - DOSED IN MG/24 HR) 0.2 mg/hr patch Place 0.2 mg onto the skin daily.   Yes Historical Provider, MD  potassium chloride (K-DUR,KLOR-CON) 10 MEQ tablet Take 10 mEq by mouth daily.   Yes Historical Provider, MD  senna-docusate (SENOKOT-S) 8.6-50 MG per tablet Take 1 tablet by mouth at bedtime.   Yes Historical Provider, MD  sennosides-docusate sodium (SENOKOT-S) 8.6-50 MG tablet Take 1 tablet by mouth daily as needed for constipation ((in addition to daily scheduled dose)).   Yes Historical Provider, MD  spironolactone (ALDACTONE) 25 MG tablet Take 12.5 mg by mouth daily.   Yes Historical Provider, MD  traZODone (DESYREL) 50 MG tablet Take 50 mg by mouth at bedtime.   Yes Historical Provider, MD  venlafaxine XR (EFFEXOR-XR) 75 MG 24 hr capsule Take 75 mg by mouth daily.   Yes Historical Provider, MD  warfarin (COUMADIN) 2 MG tablet Take 2-4 mg by mouth daily. Take 2mg  Every Mon, Wed, Fri, and Sat. Take 4mg  all other days   Yes Historical Provider, MD    ALLERGIES:  Allergies  Allergen Reactions  . Baclofen Other (See Comments)    Reaction unknown  . Clindamycin/Lincomycin Other (See Comments)    Reaction unknown    SOCIAL HISTORY:  History  Substance Use Topics  . Smoking status: Never Smoker   . Smokeless tobacco: Not on file  . Alcohol Use: No    FAMILY HISTORY: History reviewed. No pertinent family history.  EXAM: BP 106/76  Temp(Src) 98.8 F (37.1 C) (Oral)  Resp 23  SpO2 93% CONSTITUTIONAL: Alert and oriented and responds appropriately to questions. Elderly, chronically ill-appearing, in mild respiratory distress HEAD: Normocephalic EYES: Conjunctivae clear, PERRL ENT: normal nose; no rhinorrhea; moist mucous membranes; pharynx without lesions noted NECK: Supple, no meningismus, no LAD , no JVD on exam CARD: RRR; S1 and S2 appreciated; no murmurs, no clicks, no rubs, no gallops RESP: Normal  chest excursion without splinting; patient is tachypneic with mild increased work of breathing and mild respiratory distress, oxygen saturations fluctuated between 92-93% on 4 L oxygen, diminished breath sounds at her bases bilaterally with no wheezing or rhonchi or rales appreciated ABD/GI: Normal bowel sounds; non-distended; soft, non-tender, no rebound, no guarding BACK:  The back appears normal and is non-tender to palpation, there is no CVA tenderness EXT: Normal ROM in all joints; non-tender to palpation; no edema; normal capillary refill; no cyanosis    SKIN: Normal color for age and race; warm NEURO: Right-sided hemiparesis and dysarthria which is her baseline PSYCH: The patient's mood and manner are appropriate. Grooming and personal hygiene are appropriate.  MEDICAL DECISION MAKING: Patient here with mild respiratory distress. Concern for possible COPD exacerbation versus CHF versus pneumonia. Will obtain ABG, cardiac labs, chest x-ray. We'll give continuous albuterol and Solu-Medrol. Patient will need admission. EKG shows no  new ischemic changes.  ED PROGRESS: Patient's ABG shows PO2 of 48 on her 2 L of oxygen, PCO2 33.5, pH 7.377, bicarbonate 19.7. Will increase patient's oxygen given her hypoxia on ABG. Chest x-ray shows bilateral pleural effusions, interstitial edema. Suspect CHF, pleural effusions and COPD exacerbation the cause of her symptoms today. Have discussed with Dr. Ivery Quale with [facility] for admission to step down.  Patient is doing better with increased oxygen and continuous albuterol treatments. She has received IV Solu-Medrol and IV Lasix. We'll continue to closely monitor as patient may need BiPAP.   Pt continues to improve with neb treatments.  CRITICAL CARE Performed by: Raelyn Number   Total critical care time: 35 minutes  Critical care time was exclusive of separately billable procedures and treating other patients.  Critical care  was necessary to treat or prevent imminent or life-threatening deterioration.  Critical care was time spent personally by me on the following activities: development of treatment plan with patient and/or surrogate as well as nursing, discussions with consultants, evaluation of patient's response to treatment, examination of patient, obtaining history from patient or surrogate, ordering and performing treatments and interventions, ordering and review of laboratory studies, ordering and review of radiographic studies, pulse oximetry and re-evaluation of patient's condition. care    EKG Interpretation    Date/Time:  Wednesday July 22 2013 09:33:12 EST Ventricular Rate:  80 PR Interval:    QRS Duration: 121 QT Interval:  439 QTC Calculation: 506 R Axis:   -39 Text Interpretation:  Atrial fibrillation IVCD, consider atypical RBBB LVH with IVCD, LAD and secondary repol abnrm Prolonged QT interval No significant change since April 17 2013 Confirmed by Lichelle Viets  DO, Vickie Melnik ([id number]) on 07/22/2013 10:11:27 AM              Layla Maw Laticha Ferrucci, DO 07/22/13 1614

## 2013-07-22 NOTE — ED Notes (Signed)
Nitro patch removed for hypotension. Dr. Elesa MassedWard aware.

## 2013-07-22 NOTE — ED Notes (Signed)
Troponin results given to Dr. Elesa MassedWard

## 2013-07-22 NOTE — Progress Notes (Signed)
ANTICOAGULATION CONSULT NOTE - Initial Consult  Pharmacy Consult for Warfarin Indication: atrial fibrillation  Allergies  Allergen Reactions  . Baclofen Other (See Comments)    Reaction unknown  . Clindamycin/Lincomycin Other (See Comments)    Reaction unknown    Patient Measurements:   Vital Signs: Temp: 98.4 F (36.9 C) (02/25 2009) Temp src: Oral (02/25 2009) BP: 130/88 mmHg (02/25 2009) Pulse Rate: 88 (02/25 2009)  Labs:  Recent Labs  07/22/13 0936 07/22/13 1007 07/22/13 1832 07/22/13 2033  HGB 9.2*  --  9.5*  --   HCT 29.9*  --  31.7*  --   PLT 210  --  221  --   LABPROT  --   --   --  24.0*  INR  --   --   --  2.23*  CREATININE 1.84*  --  1.80*  --   TROPONINI  --  <0.30  --  <0.30    The CrCl is unknown because both a height and weight (above a minimum accepted value) are required for this calculation.   Medical History: Past Medical History  Diagnosis Date  . Pneumonia   . Pleural effusion   . CHF (congestive heart failure)   . Paroxysmal atrial fibrillation   . COPD (chronic obstructive pulmonary disease)   . Stroke   . Dysarthria due to cerebrovascular accident   . 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 Coronary artery disease   . Diabetes mellitus without complication   . GERD (gastroesophageal reflux disease)   . Dementia       Assessment: 72yof with Hx Afib on coumadin pta admitted with SOB.  She is on Coumadin at SNF with admission INR 2.23 - at goal.  No bleeding noted.    Goal of Therapy:  INR 2-3 Monitor platelets by anticoagulation protocol: Yes   Plan:  Continue home dose Coumadin 2mg  MWFSu, 4mg  TTSa Daily Protime  Leota SauersLisa Francyne Arreaga Pharm.D. CPP, BCPS Clinical Pharmacist [phone] 07/22/2013 10:02 PM

## 2013-07-22 NOTE — ED Notes (Signed)
Son - Helane RimaZane Thomas - [phone]

## 2013-07-23 ENCOUNTER — Inpatient Hospital Stay (HOSPITAL_COMMUNITY): Payer: Medicare Other

## 2013-07-23 DIAGNOSIS — R0602 Shortness of breath: Secondary | ICD-10-CM | POA: Diagnosis not present

## 2013-07-23 DIAGNOSIS — I5031 Acute diastolic (congestive) heart failure: Secondary | ICD-10-CM | POA: Diagnosis not present

## 2013-07-23 LAB — POCT I-STAT 3, ART BLOOD GAS (G3+)
Acid-base deficit: 5 mmol/L — ABNORMAL HIGH (ref 0.0–2.0)
Bicarbonate: 19.7 mEq/L — ABNORMAL LOW (ref 20.0–24.0)
O2 SAT: 83 %
PCO2 ART: 33.5 mmHg — AB (ref 35.0–45.0)
PH ART: 7.377 (ref 7.350–7.450)
PO2 ART: 48 mmHg — AB (ref 80.0–100.0)
Patient temperature: 98.6
Pressure control: 1 cmH2O
TCO2: 21 mmol/L (ref 0–100)

## 2013-07-23 LAB — COMPREHENSIVE METABOLIC PANEL
ALT: 9 U/L (ref 0–35)
AST: 21 U/L (ref 0–37)
Albumin: 2.7 g/dL — ABNORMAL LOW (ref 3.5–5.2)
Alkaline Phosphatase: 77 U/L (ref 39–117)
BILIRUBIN TOTAL: 0.3 mg/dL (ref 0.3–1.2)
BUN: 40 mg/dL — AB (ref 6–23)
CHLORIDE: 103 meq/L (ref 96–112)
CO2: 20 mEq/L (ref 19–32)
CREATININE: 1.76 mg/dL — AB (ref 0.50–1.10)
Calcium: 9.1 mg/dL (ref 8.4–10.5)
GFR calc Af Amer: 32 mL/min — ABNORMAL LOW (ref >90)
GFR calc non Af Amer: 28 mL/min — ABNORMAL LOW (ref >90)
Glucose, Bld: 143 mg/dL — ABNORMAL HIGH (ref 70–99)
Potassium: 4.3 mEq/L (ref 3.7–5.3)
Sodium: 139 mEq/L (ref 137–147)
TOTAL PROTEIN: 7.2 g/dL (ref 6.0–8.3)

## 2013-07-23 LAB — CBC
HEMATOCRIT: 27.1 % — AB (ref 36.0–46.0)
HEMOGLOBIN: 8.6 g/dL — AB (ref 12.0–15.0)
MCH: 24.8 pg — AB (ref 26.0–34.0)
MCHC: 31.7 g/dL (ref 30.0–36.0)
MCV: 78.1 fL (ref 78.0–100.0)
Platelets: 197 10*3/uL (ref 150–400)
RBC: 3.47 MIL/uL — AB (ref 3.87–5.11)
RDW: 17.7 % — ABNORMAL HIGH (ref 11.5–15.5)
WBC: 11.4 10*3/uL — AB (ref 4.0–10.5)

## 2013-07-23 LAB — MRSA PCR SCREENING: MRSA by PCR: POSITIVE — AB

## 2013-07-23 LAB — GLUCOSE, CAPILLARY
GLUCOSE-CAPILLARY: 219 mg/dL — AB (ref 70–99)
GLUCOSE-CAPILLARY: 152 mg/dL — AB (ref 70–99)
GLUCOSE-CAPILLARY: 276 mg/dL — AB (ref 70–99)
Glucose-Capillary: 156 mg/dL — ABNORMAL HIGH (ref 70–99)

## 2013-07-23 LAB — TROPONIN I
TROPONIN I: 0.38 ng/mL — AB (ref <0.30)
Troponin I: 0.42 ng/mL (ref <0.30)

## 2013-07-23 LAB — PROTIME-INR
INR: 2.28 — AB (ref 0.00–1.49)
PROTHROMBIN TIME: 24.4 s — AB (ref 11.6–15.2)

## 2013-07-23 MED ORDER — MUPIROCIN 2 % EX OINT
TOPICAL_OINTMENT | Freq: Two times a day (BID) | CUTANEOUS | Status: DC
  Administered 2013-07-23 – 2013-07-24 (×3): via NASAL
  Administered 2013-07-24: 1 via NASAL
  Administered 2013-07-25: 12:00:00 via NASAL
  Administered 2013-07-25: 1 via NASAL
  Administered 2013-07-26 – 2013-07-27 (×3): via NASAL
  Filled 2013-07-23: qty 22

## 2013-07-23 MED ORDER — CHLORHEXIDINE GLUCONATE CLOTH 2 % EX PADS
6.0000 | MEDICATED_PAD | Freq: Every day | CUTANEOUS | Status: AC
  Administered 2013-07-23 – 2013-07-27 (×5): 6 via TOPICAL

## 2013-07-23 NOTE — Progress Notes (Signed)
CRITICAL VALUE ALERT  Critical value received:  Troponin 0.37  Date of notification:  07/23/2013  Time of notification:  05:00  Critical value read back:yes  Nurse who received alert:  Lillia CorporalHancock, Makylee Sanborn Elizabeth  MD notified (1st page):  Dr. Jacky KindleAronson  Time of first page:  05:10  MD notified (2nd page):  Time of second page:  Responding MD:  Dr. Jacky KindleAronson  Time MD responded:  05:15

## 2013-07-23 NOTE — Procedures (Signed)
Successful US guided right thoracentesis. Yielded 550 ml of blood tinged serous fluid. Pt tolerated procedure well. No immediate complications. Procedure stopped with pleural fluid remaining secondary to hypotension.  Specimen was not sent for labs. CXR ordered.  Pattricia BossMORGAN, Dwanna Goshert D PA-C 07/23/2013 11:36 AM

## 2013-07-23 NOTE — Progress Notes (Signed)
ANTICOAGULATION CONSULT NOTE - Follow Up Consult  Pharmacy Consult for Coumadin Indication: atrial fibrillation  Allergies  Allergen Reactions  . Baclofen Other (See Comments)    Reaction unknown  . Clindamycin/Lincomycin Other (See Comments)    Reaction unknown    Patient Measurements: Height: 5\' 2"  (157.5 cm) Weight: 174 lb (78.926 kg) IBW/kg (Calculated) : 50.1  Vital Signs: Temp: 98.2 F (36.8 C) (02/26 0808) Temp src: Oral (02/26 0808) BP: 115/83 mmHg (02/26 0808) Pulse Rate: 85 (02/26 0808)  Labs:  Recent Labs  07/22/13 0936  07/22/13 1832 07/22/13 2033 07/23/13 0304 07/23/13 0333 07/23/13 0655  HGB 9.2*  --  9.5*  --  8.6*  --   --   HCT 29.9*  --  31.7*  --  27.1*  --   --   PLT 210  --  221  --  197  --   --   LABPROT  --   --   --  24.0*  --  24.4*  --   INR  --   --   --  2.23*  --  2.28*  --   CREATININE 1.84*  --  1.80*  --  1.76*  --   --   TROPONINI  --   < >  --  <0.30 0.38*  --  0.42*  < > = values in this interval not displayed.  Estimated Creatinine Clearance: 28.5 ml/min (by C-G formula based on Cr of 1.76).  Assessment: 72yof continues on coumadin for afib. INR is at goal. Hgb trending down but no bleeding reported. Noted plan for possible thoracentesis and vitamin k reversal.  Goal of Therapy:  INR 2-3 Monitor platelets by anticoagulation protocol: Yes   Plan:  1) Continue home dose for now 2) Follow up plan for thoracentesis   Fredrik RiggerMarkle, Steadman Prosperi Sue 07/23/2013,9:59 AM

## 2013-07-23 NOTE — Progress Notes (Signed)
Subjective: Dyspnea is improved today, and she is comfortable lying at 10 degree elevation of the head of bed, eating well, no chest pain  Objective: Vital signs in last 24 hours: Temp:  [98.2 F (36.8 C)-98.7 F (37.1 C)] 98.2 F (36.8 C) (02/26 0808) Pulse Rate:  [64-137] 85 (02/26 0808) Resp:  [20-39] 21 (02/26 0808) BP: (83-143)/(36-88) 115/83 mmHg (02/26 0808) SpO2:  [85 %-100 %] 98 % (02/26 0812) Weight:  [78.926 kg (174 lb)] 78.926 kg (174 lb) (02/26 0358) Weight change:    Intake/Output from previous day: 02/25 0701 - 02/26 0700 In: 720 [P.O.:720] Out: 200 [Urine:200]   General appearance: alert, cooperative and no distress Resp: decreased breath sounds lower 1/2 of right hemithorax Cardio: irregularly irregular rhythm Extremities: bilateral 1+ leg edema  Lab Results:  Recent Labs  07/22/13 1832 07/23/13 0304  WBC 9.6 11.4*  HGB 9.5* 8.6*  HCT 31.7* 27.1*  PLT 221 197   BMET  Recent Labs  07/22/13 0936 07/22/13 1832 07/23/13 0304  NA 143  --  139  K 4.7  --  4.3  CL 107  --  103  CO2 24  --  20  GLUCOSE 129*  --  143*  BUN 36*  --  40*  CREATININE 1.84* 1.80* 1.76*  CALCIUM 9.1  --  9.1   CMET CMP     Component Value Date/Time   NA 139 07/23/2013 0304   K 4.3 07/23/2013 0304   CL 103 07/23/2013 0304   CO2 20 07/23/2013 0304   GLUCOSE 143* 07/23/2013 0304   BUN 40* 07/23/2013 0304   CREATININE 1.76* 07/23/2013 0304   CALCIUM 9.1 07/23/2013 0304   PROT 7.2 07/23/2013 0304   ALBUMIN 2.7* 07/23/2013 0304   AST 21 07/23/2013 0304   ALT 9 07/23/2013 0304   ALKPHOS 77 07/23/2013 0304   BILITOT 0.3 07/23/2013 0304   GFRNONAA 28* 07/23/2013 0304   GFRAA 32* 07/23/2013 0304    CBG (last 3)   Recent Labs  07/22/13 1733 07/22/13 2141 07/23/13 0810  GLUCAP 280* 224* 156*    INR RESULTS:   Lab Results  Component Value Date   INR 2.28* 07/23/2013   INR 2.23* 07/22/2013   INR 3.21* 06/17/2013     Studies/Results: [place] Chest  07/23/2013   CLINICAL  DATA:  Evaluate for a pleural effusion  EXAM: CHEST ULTRASOUND  COMPARISON:  DG CHEST 1V PORT dated 07/22/2013  FINDINGS: A moderate to large right pleural effusion is appreciated. There is no appreciable left pleural effusion.  IMPRESSION: Moderate-to-large right pleural effusion.   Electronically Signed   By: Salome Holmes M.D.   On: 07/23/2013 08:38   Dg Chest Portable 1 View  07/22/2013   CLINICAL DATA:  SHORTNESS OF BREATH SHORTNESS OF BREATH  EXAM: PORTABLE CHEST - 1 VIEW  COMPARISON:  DG CHEST 2 VIEW dated 06/16/2013  FINDINGS: The right-sided PICC line has been removed in the interim. Diffuse areas of increased density project within the lung bases with a consolidative component along the lateral peripheral left lung base. There is prominence of the interstitial markings, peribronchial cuffing, and cephalization. The cardiac silhouette is enlarged. S-shaped scoliosis appreciated within the thoracolumbar spine. The osseous structures otherwise unremarkable. Atherosclerotic calcifications identified within the aorta and axillary vessels.  IMPRESSION: Interstitial infiltrate likely reflecting pulmonary edema. Asymmetric edema versus nonedematous infiltrates within the lung bases as well as likely a component of effusions left greater than right.   Electronically Signed   By:  Salome HolmesHector  Cooper M.D.   On: 07/22/2013 10:33    Medications: I have reviewed the patient's current medications.  Assessment/Plan: #1 Dyspnea: due to several factors including reaccumulation of large right pleural effusion, acute CHF from diastolic dysfunction, and COPD exacerbation. Doing much better. Will see if interventional radiologist can do a therapeutic thoracentesis with coumadin on board, otherwise will reverse coumadin with vitamin K. Continue current rx and will recheck BMET and BNP tomorrow AM.  #2 Elevated Troponin I: minimal bump likely due to leak associated with CHF. Will increase NTG when/if BP allows. #3 DM2:  stable on SSI   LOS: 1 day   Aanika Defoor G 07/23/2013, 9:52 AM

## 2013-07-23 NOTE — Progress Notes (Signed)
Utilization Review Completed.  

## 2013-07-24 LAB — BASIC METABOLIC PANEL
BUN: 54 mg/dL — ABNORMAL HIGH (ref 6–23)
CHLORIDE: 100 meq/L (ref 96–112)
CO2: 21 mEq/L (ref 19–32)
Calcium: 9.1 mg/dL (ref 8.4–10.5)
Creatinine, Ser: 2.13 mg/dL — ABNORMAL HIGH (ref 0.50–1.10)
GFR calc Af Amer: 26 mL/min — ABNORMAL LOW (ref >90)
GFR calc non Af Amer: 22 mL/min — ABNORMAL LOW (ref >90)
Glucose, Bld: 133 mg/dL — ABNORMAL HIGH (ref 70–99)
POTASSIUM: 5.2 meq/L (ref 3.7–5.3)
Sodium: 135 mEq/L — ABNORMAL LOW (ref 137–147)

## 2013-07-24 LAB — CBC
HCT: 29.1 % — ABNORMAL LOW (ref 36.0–46.0)
Hemoglobin: 8.9 g/dL — ABNORMAL LOW (ref 12.0–15.0)
MCH: 24 pg — ABNORMAL LOW (ref 26.0–34.0)
MCHC: 30.6 g/dL (ref 30.0–36.0)
MCV: 78.4 fL (ref 78.0–100.0)
PLATELETS: 247 10*3/uL (ref 150–400)
RBC: 3.71 MIL/uL — ABNORMAL LOW (ref 3.87–5.11)
RDW: 17.8 % — ABNORMAL HIGH (ref 11.5–15.5)
WBC: 27.5 10*3/uL — ABNORMAL HIGH (ref 4.0–10.5)

## 2013-07-24 LAB — GLUCOSE, CAPILLARY
GLUCOSE-CAPILLARY: 160 mg/dL — AB (ref 70–99)
Glucose-Capillary: 241 mg/dL — ABNORMAL HIGH (ref 70–99)
Glucose-Capillary: 183 mg/dL — ABNORMAL HIGH (ref 70–99)

## 2013-07-24 LAB — PROTIME-INR
INR: 2.89 — ABNORMAL HIGH (ref 0.00–1.49)
PROTHROMBIN TIME: 29.2 s — AB (ref 11.6–15.2)

## 2013-07-24 LAB — PRO B NATRIURETIC PEPTIDE: PRO B NATRI PEPTIDE: 20109 pg/mL — AB (ref 0–125)

## 2013-07-24 MED ORDER — FAMOTIDINE 20 MG PO TABS
20.0000 mg | ORAL_TABLET | Freq: Every day | ORAL | Status: DC
  Administered 2013-07-25 – 2013-07-27 (×3): 20 mg via ORAL
  Filled 2013-07-24 (×3): qty 1

## 2013-07-24 MED ORDER — SODIUM CHLORIDE 0.9 % IV SOLN
INTRAVENOUS | Status: DC
  Administered 2013-07-24: 19:00:00 via INTRAVENOUS

## 2013-07-24 NOTE — Progress Notes (Signed)
Received to 2 west. Denies any distress. No changes noted from assessment. VSS. Call bell near.Ariana LeversBaldwin, Ulah Olmo E

## 2013-07-24 NOTE — Progress Notes (Signed)
Subjective: Breathing is improved and she has a dry mouth  Objective: Vital signs in last 24 hours: Temp:  [97.6 F (36.4 C)-98.2 F (36.8 C)] 97.9 F (36.6 C) (02/27 0400) Pulse Rate:  [73-86] 80 (02/27 0400) Resp:  [19-24] 23 (02/27 0400) BP: (89-152)/(33-84) 152/82 mmHg (02/27 0400) SpO2:  [94 %-100 %] 97 % (02/27 0400) Weight:  [78.3 kg (172 lb 9.9 oz)] 78.3 kg (172 lb 9.9 oz) (02/27 0400) Weight change: -0.626 kg (-1 lb 6.1 oz)   Intake/Output from previous day: 02/26 0701 - 02/27 0700 In: 603 [P.O.:600; I.V.:3] Out: 275 [Urine:275]   General appearance: alert, cooperative and no distress Resp: decreased breath sounds right lung base Cardio: slightly irregular rhythm without significant murmur GI: soft, non-tender; bowel sounds normal; no masses,  no organomegaly  Lab Results:  Recent Labs  07/23/13 0304 07/24/13 0223  WBC 11.4* 27.5*  HGB 8.6* 8.9*  HCT 27.1* 29.1*  PLT 197 247   BMET  Recent Labs  07/23/13 0304 07/24/13 0223  NA 139 135*  K 4.3 5.2  CL 103 100  CO2 20 21  GLUCOSE 143* 133*  BUN 40* 54*  CREATININE 1.76* 2.13*  CALCIUM 9.1 9.1   CMET CMP     Component Value Date/Time   NA 135* 07/24/2013 0223   K 5.2 07/24/2013 0223   CL 100 07/24/2013 0223   CO2 21 07/24/2013 0223   GLUCOSE 133* 07/24/2013 0223   BUN 54* 07/24/2013 0223   CREATININE 2.13* 07/24/2013 0223   CALCIUM 9.1 07/24/2013 0223   PROT 7.2 07/23/2013 0304   ALBUMIN 2.7* 07/23/2013 0304   AST 21 07/23/2013 0304   ALT 9 07/23/2013 0304   ALKPHOS 77 07/23/2013 0304   BILITOT 0.3 07/23/2013 0304   GFRNONAA 22* 07/24/2013 0223   GFRAA 26* 07/24/2013 0223    CBG (last 3)   Recent Labs  07/23/13 1217 07/23/13 1742 07/23/13 2152  GLUCAP 219* 152* 276*    INR RESULTS:   Lab Results  Component Value Date   INR 2.89* 07/24/2013   INR 2.28* 07/23/2013   INR 2.23* 07/22/2013     Studies/Results: Dg Chest 1 View  07/23/2013   CLINICAL DATA:  Post right thoracentesis.  EXAM:  CHEST - 1 VIEW  COMPARISON:  07/22/2013  FINDINGS: Cardiomegaly again noted. Slight improved aeration. Persistent mild interstitial prominence. Bilateral apical pleural thickening is noted. There is small residual right pleural effusion with right basilar atelectasis or infiltrate. No diagnostic pneumothorax.  IMPRESSION: Slight improved aeration. Persistent mild interstitial prominence. Bilateral apical pleural thickening is noted. There is small residual right pleural effusion with right basilar atelectasis or infiltrate. No diagnostic pneumothorax.   Electronically Signed   By: Natasha Mead M.D.   On: 07/23/2013 11:52   [place] Chest  07/23/2013   CLINICAL DATA:  Evaluate for a pleural effusion  EXAM: CHEST ULTRASOUND  COMPARISON:  DG CHEST 1V PORT dated 07/22/2013  FINDINGS: A moderate to large right pleural effusion is appreciated. There is no appreciable left pleural effusion.  IMPRESSION: Moderate-to-large right pleural effusion.   Electronically Signed   By: Salome Holmes M.D.   On: 07/23/2013 08:38   Dg Chest Portable 1 View  07/22/2013   CLINICAL DATA:  SHORTNESS OF BREATH SHORTNESS OF BREATH  EXAM: PORTABLE CHEST - 1 VIEW  COMPARISON:  DG CHEST 2 VIEW dated 06/16/2013  FINDINGS: The right-sided PICC line has been removed in the interim. Diffuse areas of increased density project within the  lung bases with a consolidative component along the lateral peripheral left lung base. There is prominence of the interstitial markings, peribronchial cuffing, and cephalization. The cardiac silhouette is enlarged. S-shaped scoliosis appreciated within the thoracolumbar spine. The osseous structures otherwise unremarkable. Atherosclerotic calcifications identified within the aorta and axillary vessels.  IMPRESSION: Interstitial infiltrate likely reflecting pulmonary edema. Asymmetric edema versus nonedematous infiltrates within the lung bases as well as likely a component of effusions left greater than right.    Electronically Signed   By: Salome HolmesHector  Cooper M.D.   On: 07/22/2013 10:33   [place] Thoracentesis Asp Pleural Space W/img Guide  07/23/2013   CLINICAL DATA:  Congestive heart failure, shortness of breath, right pleural effusion, request for therapeutic thoracentesis.  EXAM: ULTRASOUND GUIDED right THORACENTESIS  COMPARISON:  None.  FINDINGS: A total of approximately 550 ml of blood tinged serous fluid was removed. A fluid sample was not sent for laboratory analysis.  IMPRESSION: Successful ultrasound guided right thoracentesis yielding 550 ml of pleural fluid.  Read By:  Pattricia BossKoreen Morgan PA-C  PROCEDURE: An ultrasound guided thoracentesis was thoroughly discussed with the patient and questions answered. The benefits, risks, alternatives and complications were also discussed. The patient understands and wishes to proceed with the procedure. Written consent was obtained.  Ultrasound was performed to localize and mark an adequate pocket of fluid in the right chest. The area was then prepped and draped in the normal sterile fashion. 1% Lidocaine was used for local anesthesia. Under ultrasound guidance a 19 gauge Yueh catheter was introduced. Thoracentesis was performed. The catheter was removed and a dressing applied.  Complications: None. Procedure was stopped secondary to hypotension.   Electronically Signed   By: Richarda OverlieAdam  Henn M.D.   On: 07/23/2013 11:45    Medications: I have reviewed the patient's current medications.  Assessment/Plan: #1 Dyspnea: improved with thoracentesis, solumedrol, and adjustment of meds. If condition continues to improve [facility] be able [facility] either tomorrow or Monday. #2 DM2: stable on SSI #3 Anemia: stable   LOS: 2 days   Roshanna Cimino G 07/24/2013, 8:02 AM

## 2013-07-24 NOTE — Progress Notes (Signed)
Inpatient Diabetes Program Recommendations  AACE/ADA: New Consensus Statement on Inpatient Glycemic Control (2013)  Target Ranges:  Prepandial:   less than 140 mg/dL      Peak postprandial:   less than 180 mg/dL (1-2 hours)      Critically ill patients:  140 - 180 mg/dL   Results for Ariana [name], Ariana [name] (MRN [id number]) as of 07/24/2013 10:50  Ref. Range 07/23/2013 08:10 07/23/2013 12:17 07/23/2013 17:42 07/23/2013 21:52 07/24/2013 07:39  Glucose-Capillary Latest Range: 70-99 mg/dL 409156 (H) 811219 (H) 914152 (H) 276 (H) 160 (H)   Diabetes history: DM2 Outpatient Diabetes medications: Levemir 7 units BID, Novolog 0-9 units TID Current orders for Inpatient glycemic control: Levemir 7 units BID, Novolog 0-20 units AC, Novolog 0-5 units HS  Inpatient Diabetes Program Recommendations Insulin - Meal Coverage: While inpatient and receiving steroids, please consider ordering Novolog 4 units TID with meals.  Note: Patient continues to receive Solumedrol 80 mg Q6H which is contributing to hyperglycemia.  Please consider ordering Novolog meal coverage to cover increase in postprandial glucose.   Thanks, Orlando PennerMarie Amous Crewe, RN, MSN, CCRN Diabetes Coordinator Inpatient Diabetes Program [phone] (Team Pager) [phone] (AP office) [phone] [facility] office)

## 2013-07-24 NOTE — Progress Notes (Signed)
ANTICOAGULATION CONSULT NOTE - Follow Up Consult  Pharmacy Consult for Coumadin Indication: atrial fibrillation  Allergies  Allergen Reactions  . Baclofen Other (See Comments)    Reaction unknown  . Clindamycin/Lincomycin Other (See Comments)    Reaction unknown    Patient Measurements: Height: 5\' 2"  (157.5 cm) Weight: 172 lb 9.9 oz (78.3 kg) IBW/kg (Calculated) : 50.1  Vital Signs: Temp: 97.5 F (36.4 C) (02/27 1334) Temp src: Oral (02/27 1334) BP: 143/95 mmHg (02/27 1334) Pulse Rate: 85 (02/27 1334)  Labs:  Recent Labs  07/22/13 1832 07/22/13 2033 07/23/13 0304 07/23/13 0333 07/23/13 0655 07/24/13 0223  HGB 9.5*  --  8.6*  --   --  8.9*  HCT 31.7*  --  27.1*  --   --  29.1*  PLT 221  --  197  --   --  247  LABPROT  --  24.0*  --  24.4*  --  29.2*  INR  --  2.23*  --  2.28*  --  2.89*  CREATININE 1.80*  --  1.76*  --   --  2.13*  TROPONINI  --  <0.30 0.38*  --  0.42*  --     Estimated Creatinine Clearance: 23.5 ml/min (by C-G formula based on Cr of 2.13).  Assessment: 72yof continues on coumadin for afib. INR is at goal. Pt is now s/p thoracentesis. Plan to dc soon.   Goal of Therapy:  INR 2-3 Monitor platelets by anticoagulation protocol: Yes   Plan:   1) Continue home dose  2) Cont daily INR  Ulyses SouthwardMinh Kiesha Ensey, PharmD Pager: [phone] 07/24/2013 3:04 PM

## 2013-07-24 NOTE — Progress Notes (Signed)
Attempted to contact pt's son on cell and home phone and had no answer. Will try again later to contact son to inform pt has moved to floor 2 west.

## 2013-07-24 NOTE — Progress Notes (Signed)
DNR and FL2 forms on chart; awaiting MD signature prior to d/c to SNF.Ariana LeversBaldwin, Ariana [name]

## 2013-07-24 NOTE — Progress Notes (Signed)
Clinical Social Work Department BRIEF PSYCHOSOCIAL ASSESSMENT 07/24/2013  Patient:  Ariana [name],Ariana [name]     Account Number:  [id number]     Admit date:  07/22/2013  Clinical Social Worker:  Varney BilesANDERSON,Marquarius Lofton, LCSWA  Date/Time:  07/24/2013 04:43 PM  Referred by:  Physician  Date Referred:  07/24/2013 Referred for  SNF Placement   Other Referral:   Interview type:  Other - See comment Other interview type:   MD consult to complete assessment    PSYCHOSOCIAL DATA Living Status:  FACILITY Admitted from facility:  [facility] [place] Level of care:  Skilled Nursing Facility Primary support name:  Helane RimaZane Thomas Primary support relationship to patient:  CHILD, ADULT Degree of support available:   Good--pt receives support from son and daughter-in-law and [facility] at [facility] [place].    CURRENT CONCERNS Current Concerns  Post-Acute Placement   Other Concerns:    SOCIAL WORK ASSESSMENT / PLAN CSW called pt's son Tomie ChinaZane, and his wife answered the phone. CSW explained consult, and Ms. Maisie Fushomas verified pt is from [facility] [place] and they do want her to return. CSW explained CSW following case will ensure pt can discharge back. Ms. Maisie Fushomas thanked CSW and had no questions/concerns at the time of phone call.   Assessment/plan status:  Psychosocial Support/Ongoing Assessment of Needs Other assessment/ plan:   Information/referral to community resources:   SNF ([facility] [place]).    PATIENT'S/FAMILY'S RESPONSE TO PLAN OF CARE: Good--pt's daughter-in-law friendly in phone conversation, expressed understanding of CSW role in discharge and thanked CSW for assistance.       Maryclare LabradorJulie Sosaia Pittinger, MSW, [facility] Hospital[facility]WA Clinical Social Worker [phone]

## 2013-07-24 NOTE — Progress Notes (Signed)
CSW consulted that pt will need updated FL2 and might be ready for discharge today. CSW just updated FL2 and is placing on chart now.    Maryclare LabradorJulie Grabiel Schmutz, MSW, [facility] Center[facility]WA Clinical Social Worker [phone]

## 2013-07-24 NOTE — Progress Notes (Signed)
CSW asked RN to have MD complete gold DNR form, or check if DNR form is already completed.   Maryclare LabradorJulie Miria Cappelli, MSW, [facility] LL[facility]SWA Clinical Social Worker [phone]

## 2013-07-24 NOTE — Progress Notes (Addendum)
Pt is resting in bed. Pt just received breakfast and is eating. Pt has orders to go to 2west rm 32 per md orders. Pt will be transferring on telemetry. Gave report to 2 west nurse and taking pt shortly to floor. Pt in no distress at this time.

## 2013-07-25 LAB — BASIC METABOLIC PANEL
BUN: 66 mg/dL — AB (ref 6–23)
CO2: 20 mEq/L (ref 19–32)
CREATININE: 2.12 mg/dL — AB (ref 0.50–1.10)
Calcium: 9.1 mg/dL (ref 8.4–10.5)
Chloride: 100 mEq/L (ref 96–112)
GFR calc Af Amer: 26 mL/min — ABNORMAL LOW (ref >90)
GFR, EST NON AFRICAN AMERICAN: 22 mL/min — AB (ref >90)
GLUCOSE: 191 mg/dL — AB (ref 70–99)
POTASSIUM: 5 meq/L (ref 3.7–5.3)
Sodium: 134 mEq/L — ABNORMAL LOW (ref 137–147)

## 2013-07-25 LAB — GLUCOSE, CAPILLARY
GLUCOSE-CAPILLARY: 169 mg/dL — AB (ref 70–99)
GLUCOSE-CAPILLARY: 210 mg/dL — AB (ref 70–99)
Glucose-Capillary: 255 mg/dL — ABNORMAL HIGH (ref 70–99)
Glucose-Capillary: 155 mg/dL — ABNORMAL HIGH (ref 70–99)

## 2013-07-25 LAB — CBC
HEMATOCRIT: 29.6 % — AB (ref 36.0–46.0)
Hemoglobin: 9.4 g/dL — ABNORMAL LOW (ref 12.0–15.0)
MCH: 24.9 pg — ABNORMAL LOW (ref 26.0–34.0)
MCHC: 31.8 g/dL (ref 30.0–36.0)
MCV: 78.5 fL (ref 78.0–100.0)
Platelets: 243 10*3/uL (ref 150–400)
RBC: 3.77 MIL/uL — ABNORMAL LOW (ref 3.87–5.11)
RDW: 17.9 % — AB (ref 11.5–15.5)
WBC: 16.4 10*3/uL — AB (ref 4.0–10.5)

## 2013-07-25 LAB — PROTIME-INR
INR: 4.06 — ABNORMAL HIGH (ref 0.00–1.49)
Prothrombin Time: 37.9 seconds — ABNORMAL HIGH (ref 11.6–15.2)

## 2013-07-25 MED ORDER — PREDNISONE 20 MG PO TABS
40.0000 mg | ORAL_TABLET | Freq: Every day | ORAL | Status: DC
  Administered 2013-07-26: 40 mg via ORAL
  Filled 2013-07-25 (×3): qty 2

## 2013-07-25 NOTE — Progress Notes (Signed)
Subjective: Breathing is about the same. Better than on admit but not very good. No pain.  Objective: Vital signs in last 24 hours: Temp:  [97.5 F (36.4 C)-97.9 F (36.6 C)] 97.9 F (36.6 C) (02/28 0700) Pulse Rate:  [68-85] 76 (02/28 0700) Resp:  [21-22] 21 (02/28 0700) BP: (100-143)/(50-95) 129/78 mmHg (02/28 0700) SpO2:  [96 %-98 %] 97 % (02/28 0700) Weight change:  Last BM Date: 07/25/13  Intake/Output from previous day: 02/27 0701 - 02/28 0700 In: 363 [P.O.:360; I.V.:3] Out: 101 [Urine:100; Stool:1] Intake/Output this shift:    General appearance: alert and cooperative Resp: decreased bs at left base actually, no w/r/r.  but not great air movement bilat. Cardio: irregularly irregular rhythm GI: soft, non-tender; bowel sounds normal; no masses,  no organomegaly Extremities: extremities normal, atraumatic, no cyanosis or edema Neurologic: Grossly normal But has dysphasia.  Lab Results:  Recent Labs  07/24/13 0223 07/25/13 0535  WBC 27.5* 16.4*  HGB 8.9* 9.4*  HCT 29.1* 29.6*  PLT 247 243   BMET  Recent Labs  07/24/13 0223 07/25/13 0535  NA 135* 134*  K 5.2 5.0  CL 100 100  CO2 21 20  GLUCOSE 133* 191*  BUN 54* 66*  CREATININE 2.13* 2.12*  CALCIUM 9.1 9.1   CMET CMP     Component Value Date/Time   NA 134* 07/25/2013 0535   K 5.0 07/25/2013 0535   CL 100 07/25/2013 0535   CO2 20 07/25/2013 0535   GLUCOSE 191* 07/25/2013 0535   BUN 66* 07/25/2013 0535   CREATININE 2.12* 07/25/2013 0535   CALCIUM 9.1 07/25/2013 0535   PROT 7.2 07/23/2013 0304   ALBUMIN 2.7* 07/23/2013 0304   AST 21 07/23/2013 0304   ALT 9 07/23/2013 0304   ALKPHOS 77 07/23/2013 0304   BILITOT 0.3 07/23/2013 0304   GFRNONAA 22* 07/25/2013 0535   GFRAA 26* 07/25/2013 0535     Studies/Results: No results found.  Medications: I have reviewed the patient's current medications.  Marland Kitchen aspirin  324 mg Oral Once  . atorvastatin  20 mg Oral QHS  . Chlorhexidine Gluconate Cloth  6 each Topical  Q0600  . clonazePAM  0.5 mg Oral q morning - 10a  . clonazePAM  1 mg Oral QHS  . docusate sodium  100 mg Oral BID  . famotidine  20 mg Oral Daily  . furosemide  20 mg Intravenous Q12H  . insulin aspart  0-20 Units Subcutaneous TID WC  . insulin aspart  0-5 Units Subcutaneous QHS  . insulin detemir  7 Units Subcutaneous BID  . methylPREDNISolone (SOLU-MEDROL) injection  80 mg Intravenous Q6H  . metoprolol tartrate  12.5 mg Oral Daily  . multivitamin with minerals  1 tablet Oral Daily  . mupirocin ointment   Nasal BID  . nitroGLYCERIN  0.2 mg Transdermal Daily  . senna-docusate  1 tablet Oral QHS  . sodium chloride  3 mL Intravenous Q12H  . traZODone  50 mg Oral QHS  . venlafaxine XR  75 mg Oral Daily  . Warfarin - Pharmacist Dosing Inpatient   Does not apply q1800   CBG (last 3)   Recent Labs  07/24/13 2057 07/25/13 0636 07/25/13 1117  GLUCAP 183* 169* 210*      Assessment/Plan:   Active Problems:   Dysarthria due to cerebrovascular accident-stable.   Acute respiratory failure with hypoxia-cont. Current meds.  Hard to know what to maximize further but keep on the iv lasix for a few more days to  see if pulm. sxs improve more. Watch bun/cr daily   Acute respiratory failure-see above dm2-cont. ssi A-fib-rate okay. inr up today. Coumadin per pharm. Back to snf in a few days if stable. Copd-cont. Nebs. Reduce steroids further.   LOS: 3 days   Ezequiel KayserPERINI,Yazlin Ekblad A, MD 07/25/2013, 12:00 PM

## 2013-07-25 NOTE — Progress Notes (Signed)
ANTICOAGULATION CONSULT NOTE - Follow Up Consult  Pharmacy Consult for Coumadin Indication: atrial fibrillation  Allergies  Allergen Reactions  . Baclofen Other (See Comments)    Reaction unknown  . Clindamycin/Lincomycin Other (See Comments)    Reaction unknown    Patient Measurements: Height: 5\' 2"  (157.5 cm) Weight: 172 lb 9.9 oz (78.3 kg) IBW/kg (Calculated) : 50.1  Vital Signs: Temp: 97.9 F (36.6 C) (02/28 0700) Temp src: Oral (02/28 0700) BP: 129/78 mmHg (02/28 0700) Pulse Rate: 76 (02/28 0700)  Labs:  Recent Labs  07/22/13 2033 07/23/13 0304 07/23/13 0333 07/23/13 0655 07/24/13 0223 07/25/13 0535  HGB  --  8.6*  --   --  8.9* 9.4*  HCT  --  27.1*  --   --  29.1* 29.6*  PLT  --  197  --   --  247 243  LABPROT 24.0*  --  24.4*  --  29.2* 37.9*  INR 2.23*  --  2.28*  --  2.89* 4.06*  CREATININE  --  1.76*  --   --  2.13* 2.12*  TROPONINI <0.30 0.38*  --  0.42*  --   --     Estimated Creatinine Clearance: 23.6 ml/min (by C-G formula based on Cr of 2.12).  Assessment: 72yof continues on coumadin for afib. INR had been therapuetic on her home dose however INR has jumped today to 4 - unclear why. CBC stable. No bleeding reported.  Goal of Therapy:  INR 2-3 Monitor platelets by anticoagulation protocol: Yes   Plan:  1) No coumadin tonight 2) INR in AM  Fredrik RiggerMarkle, Michaell Grider Sue 07/25/2013,12:00 PM

## 2013-07-26 ENCOUNTER — Inpatient Hospital Stay (HOSPITAL_COMMUNITY): Payer: Medicare Other

## 2013-07-26 LAB — BASIC METABOLIC PANEL
BUN: 65 mg/dL — AB (ref 6–23)
CO2: 22 mEq/L (ref 19–32)
CREATININE: 1.78 mg/dL — AB (ref 0.50–1.10)
Calcium: 8.2 mg/dL — ABNORMAL LOW (ref 8.4–10.5)
Chloride: 108 mEq/L (ref 96–112)
GFR calc non Af Amer: 28 mL/min — ABNORMAL LOW (ref >90)
GFR, EST AFRICAN AMERICAN: 32 mL/min — AB (ref >90)
Glucose, Bld: 133 mg/dL — ABNORMAL HIGH (ref 70–99)
POTASSIUM: 4.2 meq/L (ref 3.7–5.3)
Sodium: 144 mEq/L (ref 137–147)

## 2013-07-26 LAB — GLUCOSE, CAPILLARY
GLUCOSE-CAPILLARY: 204 mg/dL — AB (ref 70–99)
Glucose-Capillary: 146 mg/dL — ABNORMAL HIGH (ref 70–99)
Glucose-Capillary: 99 mg/dL (ref 70–99)
Glucose-Capillary: 132 mg/dL — ABNORMAL HIGH (ref 70–99)

## 2013-07-26 LAB — PROTIME-INR
INR: 4.1 — ABNORMAL HIGH (ref 0.00–1.49)
Prothrombin Time: 38.2 seconds — ABNORMAL HIGH (ref 11.6–15.2)

## 2013-07-26 MED ORDER — FUROSEMIDE 40 MG PO TABS
40.0000 mg | ORAL_TABLET | Freq: Every day | ORAL | Status: DC
  Administered 2013-07-26 – 2013-07-27 (×2): 40 mg via ORAL
  Filled 2013-07-26 (×2): qty 1

## 2013-07-26 MED ORDER — FUROSEMIDE 10 MG/ML IJ SOLN
INTRAMUSCULAR | Status: AC
  Filled 2013-07-26: qty 4

## 2013-07-26 MED ORDER — PREDNISONE 20 MG PO TABS
30.0000 mg | ORAL_TABLET | Freq: Every day | ORAL | Status: DC
  Administered 2013-07-27: 30 mg via ORAL
  Filled 2013-07-26 (×2): qty 1

## 2013-07-26 NOTE — Progress Notes (Addendum)
Subjective: She feels a bit better and she is breathing a bit better.  Objective: Vital signs in last 24 hours: Temp:  [97 F (36.1 C)-97.8 F (36.6 C)] 97.8 F (36.6 C) (03/01 0616) Pulse Rate:  [63-71] 63 (03/01 0616) Resp:  [19-20] 19 (03/01 0616) BP: (108-142)/(81-93) 142/93 mmHg (03/01 0616) SpO2:  [96 %-100 %] 96 % (03/01 0616) Weight change:  Last BM Date: 07/26/13  Intake/Output from previous day: 02/28 0701 - 03/01 0700 In: 240 [P.O.:240] Out: -  Intake/Output this shift:    General appearance: alert, cooperative and appears stated age Resp: clear to auscultation bilaterally Cardio: irregularly irregular rhythm GI: soft, non-tender; bowel sounds normal; no masses,  no organomegaly Extremities: extremities normal, atraumatic, no cyanosis or edema Neurologic: Grossly normal Dysphasia and dysarthria stable  Lab Results:  Recent Labs  07/24/13 0223 07/25/13 0535  WBC 27.5* 16.4*  HGB 8.9* 9.4*  HCT 29.1* 29.6*  PLT 247 243   BMET  Recent Labs  07/25/13 0535 07/26/13 0419  NA 134* 144  K 5.0 4.2  CL 100 108  CO2 20 22  GLUCOSE 191* 133*  BUN 66* 65*  CREATININE 2.12* 1.78*  CALCIUM 9.1 8.2*   CMET CMP     Component Value Date/Time   NA 144 07/26/2013 0419   K 4.2 07/26/2013 0419   CL 108 07/26/2013 0419   CO2 22 07/26/2013 0419   GLUCOSE 133* 07/26/2013 0419   BUN 65* 07/26/2013 0419   CREATININE 1.78* 07/26/2013 0419   CALCIUM 8.2* 07/26/2013 0419   PROT 7.2 07/23/2013 0304   ALBUMIN 2.7* 07/23/2013 0304   AST 21 07/23/2013 0304   ALT 9 07/23/2013 0304   ALKPHOS 77 07/23/2013 0304   BILITOT 0.3 07/23/2013 0304   GFRNONAA 28* 07/26/2013 0419   GFRAA 32* 07/26/2013 0419     Studies/Results: No results found.  Medications: I have reviewed the patient's current medications.  Marland Kitchen aspirin  324 mg Oral Once  . atorvastatin  20 mg Oral QHS  . Chlorhexidine Gluconate Cloth  6 each Topical Q0600  . clonazePAM  0.5 mg Oral q morning - 10a  . clonazePAM  1 mg  Oral QHS  . docusate sodium  100 mg Oral BID  . famotidine  20 mg Oral Daily  . furosemide  20 mg Intravenous Q12H  . insulin aspart  0-20 Units Subcutaneous TID WC  . insulin aspart  0-5 Units Subcutaneous QHS  . insulin detemir  7 Units Subcutaneous BID  . metoprolol tartrate  12.5 mg Oral Daily  . multivitamin with minerals  1 tablet Oral Daily  . mupirocin ointment   Nasal BID  . nitroGLYCERIN  0.2 mg Transdermal Daily  . predniSONE  40 mg Oral Q breakfast  . senna-docusate  1 tablet Oral QHS  . sodium chloride  3 mL Intravenous Q12H  . traZODone  50 mg Oral QHS  . venlafaxine XR  75 mg Oral Daily  . Warfarin - Pharmacist Dosing Inpatient   Does not apply q1800   Lab Results  Component Value Date   INR 4.10* 07/26/2013   INR 4.06* 07/25/2013   INR 2.89* 07/24/2013     Assessment/Plan:  Active Problems:   Dysarthria due to cerebrovascular accident-stable   Acute respiratory failure with hypoxia-we are treating chf and ae copd.  i will taper down prednisone further and change to oral lasix.  May be ready for d/c to snf again tomorrow.   Acute respiratory failure-see above. anticoag  per pharmacy   LOS: 4 days   Ezequiel KayserPERINI,Tehillah Cipriani A, MD 07/26/2013, 11:20 AM

## 2013-07-26 NOTE — Progress Notes (Signed)
ANTICOAGULATION CONSULT NOTE - Follow Up Consult  Pharmacy Consult for Coumadin Indication: atrial fibrillation  Allergies  Allergen Reactions  . Baclofen Other (See Comments)    Reaction unknown  . Clindamycin/Lincomycin Other (See Comments)    Reaction unknown    Patient Measurements: Height: 5\' 2"  (157.5 cm) Weight: 172 lb 9.9 oz (78.3 kg) IBW/kg (Calculated) : 50.1  Vital Signs: Temp: 97.8 F (36.6 C) (03/01 0616) Temp src: Oral (03/01 0616) BP: 142/93 mmHg (03/01 0616) Pulse Rate: 63 (03/01 0616)  Labs:  Recent Labs  07/24/13 0223 07/25/13 0535 07/26/13 0419  HGB 8.9* 9.4*  --   HCT 29.1* 29.6*  --   PLT 247 243  --   LABPROT 29.2* 37.9* 38.2*  INR 2.89* 4.06* 4.10*  CREATININE 2.13* 2.12* 1.78*    Estimated Creatinine Clearance: 28.1 ml/min (by C-G formula based on Cr of 1.78).  Assessment: 72yof continues on coumadin for afib. INR had been therapuetic on her home dose but jumped yesterday - ? due to steroids which have now been changed to PO. INR still remains elevated despite holding last night's dose. No bleeding reported.  Goal of Therapy:  INR 2-3 Monitor platelets by anticoagulation protocol: Yes   Plan:  1) No coumadin tonight 2) INR in AM  Fredrik RiggerMarkle, Shaida Route Sue 07/26/2013,11:21 AM

## 2013-07-27 LAB — PROTIME-INR
INR: 2.06 — AB (ref 0.00–1.49)
PROTHROMBIN TIME: 22.6 s — AB (ref 11.6–15.2)

## 2013-07-27 LAB — GLUCOSE, CAPILLARY
GLUCOSE-CAPILLARY: 216 mg/dL — AB (ref 70–99)
Glucose-Capillary: 154 mg/dL — ABNORMAL HIGH (ref 70–99)

## 2013-07-27 LAB — CBC WITH DIFFERENTIAL/PLATELET
BASOS ABS: 0 10*3/uL (ref 0.0–0.1)
BASOS PCT: 0 % (ref 0–1)
Eosinophils Absolute: 0 10*3/uL (ref 0.0–0.7)
Eosinophils Relative: 0 % (ref 0–5)
HCT: 30.8 % — ABNORMAL LOW (ref 36.0–46.0)
Hemoglobin: 9.8 g/dL — ABNORMAL LOW (ref 12.0–15.0)
LYMPHS PCT: 11 % — AB (ref 12–46)
Lymphs Abs: 1.5 10*3/uL (ref 0.7–4.0)
MCH: 24.7 pg — ABNORMAL LOW (ref 26.0–34.0)
MCHC: 31.8 g/dL (ref 30.0–36.0)
MCV: 77.8 fL — ABNORMAL LOW (ref 78.0–100.0)
MONOS PCT: 12 % (ref 3–12)
Monocytes Absolute: 1.7 10*3/uL — ABNORMAL HIGH (ref 0.1–1.0)
NEUTROS ABS: 10.6 10*3/uL — AB (ref 1.7–7.7)
Neutrophils Relative %: 77 % (ref 43–77)
Platelets: 239 10*3/uL (ref 150–400)
RBC: 3.96 MIL/uL (ref 3.87–5.11)
RDW: 17.3 % — ABNORMAL HIGH (ref 11.5–15.5)
WBC: 13.8 10*3/uL — ABNORMAL HIGH (ref 4.0–10.5)

## 2013-07-27 LAB — BASIC METABOLIC PANEL
BUN: 66 mg/dL — ABNORMAL HIGH (ref 6–23)
CHLORIDE: 101 meq/L (ref 96–112)
CO2: 21 meq/L (ref 19–32)
Calcium: 8.5 mg/dL (ref 8.4–10.5)
Creatinine, Ser: 1.81 mg/dL — ABNORMAL HIGH (ref 0.50–1.10)
GFR calc Af Amer: 31 mL/min — ABNORMAL LOW (ref >90)
GFR calc non Af Amer: 27 mL/min — ABNORMAL LOW (ref >90)
Glucose, Bld: 180 mg/dL — ABNORMAL HIGH (ref 70–99)
POTASSIUM: 4.5 meq/L (ref 3.7–5.3)
SODIUM: 134 meq/L — AB (ref 137–147)

## 2013-07-27 MED ORDER — PREDNISONE 10 MG PO TABS: 30.0000 mg | ORAL_TABLET | Freq: Every day | ORAL | Status: DC

## 2013-07-27 NOTE — Progress Notes (Addendum)
ANTICOAGULATION CONSULT NOTE - Follow Up Consult  Pharmacy Consult for Coumadin Indication: atrial fibrillation  Allergies  Allergen Reactions  . Baclofen Other (See Comments)    Reaction unknown  . Clindamycin/Lincomycin Other (See Comments)    Reaction unknown    Patient Measurements: Height: 5\' 2"  (157.5 cm) Weight: 172 lb 9.9 oz (78.3 kg) IBW/kg (Calculated) : 50.1  Vital Signs: Temp: 98.4 F (36.9 C) (03/02 0629) Temp src: Oral (03/02 0629) BP: 132/83 mmHg (03/02 0629) Pulse Rate: 70 (03/02 0629)  Labs:  Recent Labs  07/25/13 0535 07/26/13 0419 07/27/13 0415  HGB 9.4*  --  9.8*  HCT 29.6*  --  30.8*  PLT 243  --  239  LABPROT 37.9* 38.2* 22.6*  INR 4.06* 4.10* 2.06*  CREATININE 2.12* 1.78* 1.81*    Estimated Creatinine Clearance: 27.6 ml/min (by C-G formula based on Cr of 1.81).  Assessment: 72yof continues on coumadin for afib. INR had been therapuetic on her home dose but jumped yesterday - ? due to steroids which have now been changed to PO. INR is therapeutic today. She is being dc today.   Goal of Therapy:  INR 2-3 Monitor platelets by anticoagulation protocol: Yes   Plan:   Coumadin per dc note

## 2013-07-27 NOTE — Care Management Note (Signed)
    Page 1 of 1   07/27/2013     4:18:18 PM   CARE MANAGEMENT NOTE 07/27/2013  Patient:  Ariana [name],Ariana [name]   Account Number:  [id number]  Date Initiated:  07/27/2013  Documentation initiated by:  Other Atienza  Subjective/Objective Assessment:   PT ADM ON 2/25 WITH RESPIRATORY FAILURE.  PTA, PT RESIDED AT [facility].     Action/Plan:   CSW CONSULTED TO FACILITATE DC BACKE TO SNF WHEN MEDICALLY STABLE FOR DC.   Anticipated DC Date:  07/27/2013   Anticipated DC Plan:  SKILLED NURSING FACILITY  In-house referral  Clinical Social Worker      DC Planning Services  CM consult      Choice offered to / List presented to:             Status of service:  Completed, signed off Medicare Important Message given?   (If response is "NO", the following Medicare IM given date fields will be blank) Date Medicare IM given:   Date Additional Medicare IM given:    Discharge Disposition:  SKILLED NURSING FACILITY  Per UR Regulation:  Reviewed for med. necessity/level of care/duration of stay  If discussed at Long Length of Stay Meetings, dates discussed:    Comments:  07/27/13 Mark Benecke,RN,BSN [phone] PT DISCHARGED TO SNF TODAY, PER CSW ARRANGEMENTS.

## 2013-07-27 NOTE — Progress Notes (Addendum)
CSW spoke to [facility], who confirmed it was okay for patient to come back to facility today. Patient agreeable to this plan and arranging transport by EMS. CSW arranged for 1pm pickup.  Maree KrabbeLindsay Graeme Menees, MSW, Theresia MajorsLCSWA [phone]

## 2013-07-27 NOTE — Progress Notes (Signed)
Nursing Note Report called to [facility] and patient transported via ems to SNF coptied chart and all paper work sent with patient will discharge as ordered. Robi Mitter, Randall AnKristin Jessup RN

## 2013-07-27 NOTE — Discharge Summary (Addendum)
Physician Discharge Summary  Patient ID: Ariana [name] MRN: [id number] DOB/AGE: [birth year] 73 y.o.  Admit date: 07/22/2013 Discharge date: 07/27/2013   Discharge Diagnoses:  Principal Problem:   Acute respiratory failure with hypoxia Active Problems:   Recurrent pleural effusion on right   Paroxysmal atrial fibrillation   Acute diastolic heart failure   Dysarthria due to cerebrovascular accident   Anemia   CKD (chronic kidney disease), stage III   Acute respiratory failure   Discharged Condition: good  Hospital Course: The patient is a 73 year old woman with multiple medical problems. She has been admitted several times recently because of acute worsening of dyspnea on exertion. She has been a patient at our skilled nursing facility since early January 2015 because of deconditioning, congestive heart failure, and COPD. She had been doing reasonably well until last night when she developed progressively worsening dyspnea at rest with worsening orthopnea. She also complained of vague chest discomfort. At the nursing home her pulse oxygen saturation levels were in the 70s on nasal cannula oxygen at 2 L per minute and only increased to the mid-80s with 4 L per minute of oxygen. She also had hypotension and was transferred to this emergency room for further evaluation. In the emergency room, higher dose oxygen was able to raise her pulse oxygen saturation levels to about 92%. She also was treated with IV corticosteroids and nebulizers. She does not feel that the dyspnea is much improved despite these measures. Lately she has not had a productive cough, fever, chills, or exertional chest discomfort. She has a history of bilateral pleural effusions are required therapeutic thoracenteses. In December 2014 she had a echocardiogram that showed normal left ventricular systolic function with severe left ventricular hypertrophy and no significant valvular heart disease. In our skilled nursing facility she  had a DO NOT RESUSCITATE order.  She was admitted to a telemetry bed and had serial cardiac enzymes drawn which showed a mild bump in the troponin I level from CHF. She had a large right pleural effusion and subsequent ultrasound guided therapeutic thoracentesis with withdrawal of over 500 ml of fluid. She was treated with high dose nebs and corticosteroids and increased diuretics with improvement in her dyspnea, so that she felt comfortable sitting up having breakfast on the day of discharge. There were no complications.    Consults: None  Significant Diagnostic Studies:  Dg Chest 2 View  07/26/2013   CLINICAL DATA:  CHF.  Pleural effusion.  EXAM: CHEST  2 VIEW  COMPARISON:  07/23/2013  FINDINGS: Hazy opacity at the right lung base mostly obscures hemidiaphragm consistent with a pleural effusion. Some right pleural fluid also extends into the inferior right oblique fissure. There is a minimal left pleural effusion. There is central vascular congestion and bilateral interstitial prominence. Mild interstitial edema is possible.  Cardiac silhouette is mildly enlarged.  IMPRESSION: No significant change from the prior study. Small right hand minimal left pleural effusions. Persistent interstitial prominence. This is most likely chronic. Mild superimposed interstitial edema is possible.   Electronically Signed   By: Amie Portland M.D.   On: 07/26/2013 21:56    Labs: Lab Results  Component Value Date   WBC 13.8* 07/27/2013   HGB 9.8* 07/27/2013   HCT 30.8* 07/27/2013   MCV 77.8* 07/27/2013   PLT 239 07/27/2013     Recent Labs Lab 07/23/13 0304  07/27/13 0415  NA 139  < > 134*  K 4.3  < > 4.5  CL 103  < >  101  CO2 20  < > 21  BUN 40*  < > 66*  CREATININE 1.76*  < > 1.81*  CALCIUM 9.1  < > 8.5  PROT 7.2  --   --   BILITOT 0.3  --   --   ALKPHOS 77  --   --   ALT 9  --   --   AST 21  --   --   GLUCOSE 143*  < > 180*  < > = values in this interval not displayed.     Lab Results  Component Value  Date   INR 2.06* 07/27/2013   INR 4.10* 07/26/2013   INR 4.06* 07/25/2013     Recent Results (from the past 240 hour(s))  MRSA PCR SCREENING     Status: Abnormal   Collection Time    07/23/13  9:57 AM      Result Value Ref Range Status   MRSA by PCR POSITIVE (*) NEGATIVE Final   Comment:            The GeneXpert MRSA Assay (FDA     approved for NASAL specimens     only), is one component of a     comprehensive MRSA colonization     surveillance program. It is not     intended to diagnose MRSA     infection nor to guide or     monitor treatment for     MRSA infections.     RESULT CALLED TO, READ BACK BY AND VERIFIED WITH:     Natividad Brood. FRALEY RN 11:45 07/23/13 ([email or web address])      Discharge Exam: Blood pressure 132/83, pulse 70, temperature 98.4 F (36.9 C), temperature source Oral, resp. rate 18, height 5\' 2"  (1.575 m), weight 78.3 kg (172 lb 9.9 oz), SpO2 98.00%.  Physical Exam: In general, she is a frail elderly white woman who was in no apparent distress sitting up in bed on nasal cannula oxygen. HEENT was normal, neck was supple and without JVD, chest had decreased breath sounds in the right base, Heart had a slightly irregular rhythm without significant murmur, abdomen was benign, and extremities had bilateral trace leg edema. She was alert with mild dysarthria and easily understood speech, and she could move all extremities somewhat.  Disposition: She will be discharged from the hospital to return to a SNF for rehabilitation.       Discharge Orders   Future Orders Complete By Expires   Call MD for:  As directed    Comments:     Call physician for fever, chills, worsening difficulty breathing, chest pain, or other concerning symptoms   Diet - low sodium heart healthy  As directed    Discharge instructions  As directed    Comments:     You will be discharged back to the [facility] nursing home today. Tomorrow advise having labs done including CBC, CMET, and BNP test. Should have body  weights checked every day with results FAXd to attending physician every Friday.   Increase activity slowly  As directed        Medication List    STOP taking these medications       potassium chloride 10 MEQ tablet  Commonly known as:  K-DUR,KLOR-CON      TAKE these medications       acetaminophen 325 MG tablet  Commonly known as:  TYLENOL  Take 650 mg by mouth every 4 (four) hours as needed (for pain).  albuterol (2.5 MG/3ML) 0.083% nebulizer solution  Commonly known as:  PROVENTIL  Take 2.5 mg by nebulization every 6 (six) hours as needed for wheezing or shortness of breath.     ASPIRIN ADULT LOW STRENGTH 81 MG chewable tablet  Generic drug:  aspirin  Chew 81 mg by mouth daily.     atorvastatin 20 MG tablet  Commonly known as:  LIPITOR  Take 20 mg by mouth at bedtime.     clonazePAM 1 MG tablet  Commonly known as:  KLONOPIN  Take 1 mg by mouth at bedtime.     clonazePAM 0.5 MG tablet  Commonly known as:  KLONOPIN  Take 1 tablet (0.5 mg total) by mouth every morning.     docusate sodium 100 MG capsule  Commonly known as:  COLACE  Take 100 mg by mouth 2 (two) times daily.     famotidine 20 MG tablet  Commonly known as:  PEPCID  Take 20 mg by mouth 2 (two) times daily.     furosemide 80 MG tablet  Commonly known as:  LASIX  Take 80 mg by mouth daily.     HYDROcodone-acetaminophen 5-325 MG per tablet  Commonly known as:  NORCO/VICODIN  Take 1 tablet by mouth 3 (three) times daily as needed for moderate pain.     insulin aspart 100 UNIT/ML injection  Commonly known as:  novoLOG  - -9 Units, Subcutaneous, 3 times daily with meals  - CBG < 70: implement hypoglycemia protocol  - CBG 70 - 120: 0 units  - CBG 121 - 150: 1 unit  - CBG 151 - 200: 2 units  - CBG 201 - 250: 3 units  - CBG 251 - 300: 5 units  - CBG 301 - 350: 7 units  - CBG 351 - 400: 9 units  - CBG > 400: call MD     insulin detemir 100 UNIT/ML injection  Commonly known as:   LEVEMIR  Inject 7 Units into the skin 2 (two) times daily.     metoprolol tartrate 25 MG tablet  Commonly known as:  LOPRESSOR  Take 12.5 mg by mouth daily.     multivitamins ther. w/minerals Tabs tablet  Take 1 tablet by mouth daily.     nitroGLYCERIN 0.2 mg/hr patch  Commonly known as:  NITRODUR - Dosed in mg/24 hr  Place 0.2 mg onto the skin daily.     predniSONE 10 MG tablet  Commonly known as:  DELTASONE  Take 3 tablets (30 mg total) by mouth daily with breakfast.     senna-docusate 8.6-50 MG per tablet  Commonly known as:  Senokot-S  Take 1 tablet by mouth at bedtime.     sennosides-docusate sodium 8.6-50 MG tablet  Commonly known as:  SENOKOT-S  Take 1 tablet by mouth daily as needed for constipation ((in addition to daily scheduled dose)).     spironolactone 25 MG tablet  Commonly known as:  ALDACTONE  Take 12.5 mg by mouth daily.     traZODone 50 MG tablet  Commonly known as:  DESYREL  Take 50 mg by mouth at bedtime.     venlafaxine XR 75 MG 24 hr capsule  Commonly known as:  EFFEXOR-XR  Take 75 mg by mouth daily.     warfarin 2 MG tablet  Commonly known as:  COUMADIN  Take 2-4 mg by mouth daily. Take 2mg  Every Mon, Wed, Fri, and Sat. Take 4mg  all other days  SignedGarlan Fillers 07/27/2013, 8:37 AM

## 2013-08-14 ENCOUNTER — Emergency Department (HOSPITAL_COMMUNITY): Payer: Medicare Other

## 2013-08-14 ENCOUNTER — Encounter (HOSPITAL_COMMUNITY): Payer: Self-pay | Admitting: Emergency Medicine

## 2013-08-14 ENCOUNTER — Inpatient Hospital Stay (HOSPITAL_COMMUNITY)
Admission: EM | Admit: 2013-08-14 | Discharge: 2013-08-18 | DRG: 291 | Disposition: A | Discharge status: Skilled Nursing Facility | Payer: Medicare Other | Attending: Internal Medicine | Admitting: Internal Medicine

## 2013-08-14 DIAGNOSIS — Z79899 Other long term (current) drug therapy: Secondary | ICD-10-CM | POA: Diagnosis not present

## 2013-08-14 DIAGNOSIS — J9600 Acute respiratory failure, unspecified whether with hypoxia or hypercapnia: Secondary | ICD-10-CM | POA: Diagnosis present

## 2013-08-14 DIAGNOSIS — R0602 Shortness of breath: Secondary | ICD-10-CM

## 2013-08-14 DIAGNOSIS — I509 Heart failure, unspecified: Secondary | ICD-10-CM | POA: Diagnosis present

## 2013-08-14 DIAGNOSIS — K219 Gastro-esophageal reflux disease without esophagitis: Secondary | ICD-10-CM | POA: Diagnosis present

## 2013-08-14 DIAGNOSIS — I480 Paroxysmal atrial fibrillation: Secondary | ICD-10-CM | POA: Diagnosis present

## 2013-08-14 DIAGNOSIS — I4891 Unspecified atrial fibrillation: Secondary | ICD-10-CM | POA: Diagnosis present

## 2013-08-14 DIAGNOSIS — F0390 Unspecified dementia without behavioral disturbance: Secondary | ICD-10-CM | POA: Diagnosis present

## 2013-08-14 DIAGNOSIS — I5031 Acute diastolic (congestive) heart failure: Principal | ICD-10-CM | POA: Diagnosis present

## 2013-08-14 DIAGNOSIS — I4510 Unspecified right bundle-branch block: Secondary | ICD-10-CM | POA: Diagnosis present

## 2013-08-14 DIAGNOSIS — I2510 Atherosclerotic heart disease of native coronary artery without angina pectoris: Secondary | ICD-10-CM | POA: Diagnosis present

## 2013-08-14 DIAGNOSIS — N183 Chronic kidney disease, stage 3 unspecified: Secondary | ICD-10-CM | POA: Diagnosis present

## 2013-08-14 DIAGNOSIS — J441 Chronic obstructive pulmonary disease with (acute) exacerbation: Secondary | ICD-10-CM | POA: Diagnosis present

## 2013-08-14 DIAGNOSIS — E119 Type 2 diabetes mellitus without complications: Secondary | ICD-10-CM | POA: Diagnosis present

## 2013-08-14 DIAGNOSIS — R0600 Dyspnea, unspecified: Secondary | ICD-10-CM | POA: Diagnosis present

## 2013-08-14 DIAGNOSIS — Z7982 Long term (current) use of aspirin: Secondary | ICD-10-CM | POA: Diagnosis not present

## 2013-08-14 DIAGNOSIS — IMO0002 Reserved for concepts with insufficient information to code with codable children: Secondary | ICD-10-CM

## 2013-08-14 DIAGNOSIS — I69959 Hemiplegia and hemiparesis following unspecified cerebrovascular disease affecting unspecified side: Secondary | ICD-10-CM | POA: Diagnosis not present

## 2013-08-14 DIAGNOSIS — I69922 Dysarthria following unspecified cerebrovascular disease: Secondary | ICD-10-CM | POA: Diagnosis not present

## 2013-08-14 DIAGNOSIS — Z66 Do not resuscitate: Secondary | ICD-10-CM | POA: Diagnosis present

## 2013-08-14 DIAGNOSIS — J9601 Acute respiratory failure with hypoxia: Secondary | ICD-10-CM | POA: Diagnosis present

## 2013-08-14 DIAGNOSIS — J90 Pleural effusion, not elsewhere classified: Secondary | ICD-10-CM | POA: Diagnosis present

## 2013-08-14 LAB — CBC WITH DIFFERENTIAL/PLATELET
BASOS PCT: 0 % (ref 0–1)
Basophils Absolute: 0 10*3/uL (ref 0.0–0.1)
EOS ABS: 0 10*3/uL (ref 0.0–0.7)
Eosinophils Relative: 0 % (ref 0–5)
HEMATOCRIT: 33.3 % — AB (ref 36.0–46.0)
HEMOGLOBIN: 10.1 g/dL — AB (ref 12.0–15.0)
Lymphocytes Relative: 4 % — ABNORMAL LOW (ref 12–46)
Lymphs Abs: 0.4 10*3/uL — ABNORMAL LOW (ref 0.7–4.0)
MCH: 23.7 pg — ABNORMAL LOW (ref 26.0–34.0)
MCHC: 30.3 g/dL (ref 30.0–36.0)
MCV: 78.2 fL (ref 78.0–100.0)
MONO ABS: 0.1 10*3/uL (ref 0.1–1.0)
MONOS PCT: 1 % — AB (ref 3–12)
Neutro Abs: 10.5 10*3/uL — ABNORMAL HIGH (ref 1.7–7.7)
Neutrophils Relative %: 95 % — ABNORMAL HIGH (ref 43–77)
Platelets: 254 10*3/uL (ref 150–400)
RBC: 4.26 MIL/uL (ref 3.87–5.11)
RDW: 17.8 % — ABNORMAL HIGH (ref 11.5–15.5)
WBC: 11.1 10*3/uL — ABNORMAL HIGH (ref 4.0–10.5)

## 2013-08-14 LAB — COMPREHENSIVE METABOLIC PANEL
ALBUMIN: 2.7 g/dL — AB (ref 3.5–5.2)
ALT: 29 U/L (ref 0–35)
AST: 27 U/L (ref 0–37)
Alkaline Phosphatase: 71 U/L (ref 39–117)
BUN: 46 mg/dL — AB (ref 6–23)
CALCIUM: 8.8 mg/dL (ref 8.4–10.5)
CO2: 24 mEq/L (ref 19–32)
CREATININE: 1.82 mg/dL — AB (ref 0.50–1.10)
Chloride: 104 mEq/L (ref 96–112)
GFR calc Af Amer: 31 mL/min — ABNORMAL LOW (ref >90)
GFR calc non Af Amer: 27 mL/min — ABNORMAL LOW (ref >90)
Glucose, Bld: 176 mg/dL — ABNORMAL HIGH (ref 70–99)
Potassium: 4 mEq/L (ref 3.7–5.3)
Sodium: 144 mEq/L (ref 137–147)
TOTAL PROTEIN: 6.5 g/dL (ref 6.0–8.3)
Total Bilirubin: 0.3 mg/dL (ref 0.3–1.2)

## 2013-08-14 LAB — I-STAT ARTERIAL BLOOD GAS, ED
Acid-Base Excess: 1 mmol/L (ref 0.0–2.0)
Bicarbonate: 25.1 mEq/L — ABNORMAL HIGH (ref 20.0–24.0)
O2 SAT: 96 %
PCO2 ART: 37.4 mmHg (ref 35.0–45.0)
PH ART: 7.434 (ref 7.350–7.450)
PO2 ART: 82 mmHg (ref 80.0–100.0)
Patient temperature: 98.6
TCO2: 26 mmol/L (ref 0–100)

## 2013-08-14 LAB — GLUCOSE, CAPILLARY: Glucose-Capillary: 255 mg/dL — ABNORMAL HIGH (ref 70–99)

## 2013-08-14 LAB — PRO B NATRIURETIC PEPTIDE: PRO B NATRI PEPTIDE: 28889 pg/mL — AB (ref 0–125)

## 2013-08-14 LAB — PROTIME-INR
INR: 1.05 (ref 0.00–1.49)
PROTHROMBIN TIME: 13.5 s (ref 11.6–15.2)

## 2013-08-14 LAB — I-STAT CG4 LACTIC ACID, ED: LACTIC ACID, VENOUS: 0.8 mmol/L (ref 0.5–2.2)

## 2013-08-14 LAB — TROPONIN I: TROPONIN I: 0.36 ng/mL — AB (ref <0.30)

## 2013-08-14 MED ORDER — INSULIN ASPART 100 UNIT/ML ~~LOC~~ SOLN
0.0000 [IU] | Freq: Three times a day (TID) | SUBCUTANEOUS | Status: DC
  Administered 2013-08-15: 2 [IU] via SUBCUTANEOUS
  Administered 2013-08-15: 1 [IU] via SUBCUTANEOUS
  Administered 2013-08-16: 2 [IU] via SUBCUTANEOUS
  Administered 2013-08-16 – 2013-08-17 (×2): 3 [IU] via SUBCUTANEOUS
  Administered 2013-08-17: 7 [IU] via SUBCUTANEOUS
  Administered 2013-08-17: 1 [IU] via SUBCUTANEOUS
  Administered 2013-08-18: 5 [IU] via SUBCUTANEOUS

## 2013-08-14 MED ORDER — ADULT MULTIVITAMIN W/MINERALS CH
1.0000 | ORAL_TABLET | Freq: Every day | ORAL | Status: DC
  Administered 2013-08-15 – 2013-08-18 (×4): 1 via ORAL
  Filled 2013-08-14 (×4): qty 1

## 2013-08-14 MED ORDER — ACETAMINOPHEN 325 MG PO TABS: 650.0000 mg | ORAL_TABLET | ORAL | Status: DC | PRN

## 2013-08-14 MED ORDER — ENOXAPARIN SODIUM 30 MG/0.3ML ~~LOC~~ SOLN
30.0000 mg | SUBCUTANEOUS | Status: DC
  Administered 2013-08-14 – 2013-08-17 (×4): 30 mg via SUBCUTANEOUS
  Filled 2013-08-14 (×5): qty 0.3

## 2013-08-14 MED ORDER — SENNOSIDES-DOCUSATE SODIUM 8.6-50 MG PO TABS
1.0000 | ORAL_TABLET | Freq: Every day | ORAL | Status: DC
  Administered 2013-08-14 – 2013-08-17 (×4): 1 via ORAL
  Filled 2013-08-14 (×5): qty 1

## 2013-08-14 MED ORDER — FUROSEMIDE 10 MG/ML IJ SOLN
40.0000 mg | Freq: Two times a day (BID) | INTRAMUSCULAR | Status: DC
  Administered 2013-08-15 – 2013-08-18 (×6): 40 mg via INTRAVENOUS
  Filled 2013-08-14 (×9): qty 4

## 2013-08-14 MED ORDER — METHYLPREDNISOLONE SODIUM SUCC 125 MG IJ SOLR
125.0000 mg | Freq: Once | INTRAMUSCULAR | Status: AC
Start: 2013-08-14 — End: 2013-08-14
  Administered 2013-08-14: 125 mg via INTRAVENOUS
  Filled 2013-08-14: qty 2

## 2013-08-14 MED ORDER — PREDNISONE 20 MG PO TABS
30.0000 mg | ORAL_TABLET | Freq: Every day | ORAL | Status: DC
  Administered 2013-08-15 – 2013-08-18 (×4): 30 mg via ORAL
  Filled 2013-08-14 (×5): qty 1

## 2013-08-14 MED ORDER — POTASSIUM CHLORIDE IN NACL 20-0.9 MEQ/L-% IV SOLN
INTRAVENOUS | Status: DC
Start: 2013-08-14 — End: 2013-08-18
  Administered 2013-08-14: 22:00:00 via INTRAVENOUS
  Filled 2013-08-14 (×3): qty 1000

## 2013-08-14 MED ORDER — TRAZODONE HCL 50 MG PO TABS
50.0000 mg | ORAL_TABLET | Freq: Every day | ORAL | Status: DC
  Administered 2013-08-14 – 2013-08-17 (×4): 50 mg via ORAL
  Filled 2013-08-14 (×5): qty 1

## 2013-08-14 MED ORDER — ALBUTEROL SULFATE (2.5 MG/3ML) 0.083% IN NEBU
2.5000 mg | INHALATION_SOLUTION | Freq: Four times a day (QID) | RESPIRATORY_TRACT | Status: DC
Start: 2013-08-14 — End: 2013-08-17
  Administered 2013-08-15 – 2013-08-17 (×10): 2.5 mg via RESPIRATORY_TRACT
  Filled 2013-08-14 (×24): qty 3

## 2013-08-14 MED ORDER — TIOTROPIUM BROMIDE MONOHYDRATE 18 MCG IN CAPS
18.0000 ug | ORAL_CAPSULE | Freq: Every day | RESPIRATORY_TRACT | Status: DC
Start: 2013-08-15 — End: 2013-08-18
  Administered 2013-08-15 – 2013-08-18 (×4): 18 ug via RESPIRATORY_TRACT
  Filled 2013-08-14: qty 5

## 2013-08-14 MED ORDER — FUROSEMIDE 10 MG/ML IJ SOLN
40.0000 mg | Freq: Once | INTRAMUSCULAR | Status: AC
  Administered 2013-08-14: 40 mg via INTRAVENOUS
  Filled 2013-08-14: qty 4

## 2013-08-14 MED ORDER — FAMOTIDINE 20 MG PO TABS
20.0000 mg | ORAL_TABLET | Freq: Two times a day (BID) | ORAL | Status: DC
  Administered 2013-08-14 – 2013-08-17 (×7): 20 mg via ORAL
  Filled 2013-08-14 (×9): qty 1

## 2013-08-14 MED ORDER — POLYETHYLENE GLYCOL 3350 17 G PO PACK
17.0000 g | PACK | Freq: Every day | ORAL | Status: DC | PRN
  Filled 2013-08-14: qty 1

## 2013-08-14 MED ORDER — DOCUSATE SODIUM 100 MG PO CAPS
100.0000 mg | ORAL_CAPSULE | Freq: Two times a day (BID) | ORAL | Status: DC
  Administered 2013-08-14 – 2013-08-18 (×7): 100 mg via ORAL
  Filled 2013-08-14 (×9): qty 1

## 2013-08-14 MED ORDER — ATORVASTATIN CALCIUM 20 MG PO TABS
20.0000 mg | ORAL_TABLET | Freq: Every day | ORAL | Status: DC
  Administered 2013-08-14 – 2013-08-17 (×4): 20 mg via ORAL
  Filled 2013-08-14 (×6): qty 1

## 2013-08-14 MED ORDER — METOPROLOL TARTRATE 12.5 MG HALF TABLET
12.5000 mg | ORAL_TABLET | Freq: Two times a day (BID) | ORAL | Status: DC
  Administered 2013-08-14 – 2013-08-18 (×8): 12.5 mg via ORAL
  Filled 2013-08-14 (×9): qty 1

## 2013-08-14 MED ORDER — PROMETHAZINE HCL 25 MG PO TABS: 12.5000 mg | ORAL_TABLET | Freq: Four times a day (QID) | ORAL | Status: DC | PRN

## 2013-08-14 MED ORDER — ALUM & MAG HYDROXIDE-SIMETH 200-200-20 MG/5ML PO SUSP: 30.0000 mL | Freq: Four times a day (QID) | ORAL | Status: DC | PRN

## 2013-08-14 MED ORDER — SPIRONOLACTONE 25 MG PO TABS
25.0000 mg | ORAL_TABLET | Freq: Every day | ORAL | Status: DC
  Administered 2013-08-14 – 2013-08-18 (×5): 25 mg via ORAL
  Filled 2013-08-14 (×5): qty 1

## 2013-08-14 MED ORDER — IPRATROPIUM-ALBUTEROL 0.5-2.5 (3) MG/3ML IN SOLN
3.0000 mL | Freq: Once | RESPIRATORY_TRACT | Status: AC
  Administered 2013-08-14: 3 mL via RESPIRATORY_TRACT
  Filled 2013-08-14: qty 3

## 2013-08-14 MED ORDER — ASPIRIN EC 81 MG PO TBEC
81.0000 mg | DELAYED_RELEASE_TABLET | Freq: Every day | ORAL | Status: DC
  Administered 2013-08-14 – 2013-08-18 (×5): 81 mg via ORAL
  Filled 2013-08-14 (×5): qty 1

## 2013-08-14 MED ORDER — HYDROCODONE-ACETAMINOPHEN 5-325 MG PO TABS
1.0000 | ORAL_TABLET | Freq: Three times a day (TID) | ORAL | Status: DC | PRN
  Administered 2013-08-15 – 2013-08-16 (×2): 1 via ORAL
  Filled 2013-08-14 (×2): qty 1

## 2013-08-14 MED ORDER — CLONAZEPAM 0.5 MG PO TABS
1.0000 mg | ORAL_TABLET | Freq: Every day | ORAL | Status: DC
  Administered 2013-08-14 – 2013-08-17 (×4): 1 mg via ORAL
  Filled 2013-08-14 (×4): qty 2

## 2013-08-14 MED ORDER — NITROGLYCERIN 0.2 MG/HR TD PT24
0.2000 mg | MEDICATED_PATCH | Freq: Every day | TRANSDERMAL | Status: DC
  Administered 2013-08-14 – 2013-08-18 (×5): 0.2 mg via TRANSDERMAL
  Filled 2013-08-14 (×5): qty 1

## 2013-08-14 MED ORDER — VENLAFAXINE HCL ER 75 MG PO CP24
75.0000 mg | ORAL_CAPSULE | Freq: Every day | ORAL | Status: DC
  Administered 2013-08-15 – 2013-08-18 (×4): 75 mg via ORAL
  Filled 2013-08-14 (×4): qty 1

## 2013-08-14 MED ORDER — CLONAZEPAM 0.5 MG PO TABS
0.5000 mg | ORAL_TABLET | Freq: Every morning | ORAL | Status: DC
  Administered 2013-08-15 – 2013-08-18 (×3): 0.5 mg via ORAL
  Filled 2013-08-14 (×3): qty 1

## 2013-08-14 MED ORDER — QUETIAPINE FUMARATE 100 MG PO TABS
100.0000 mg | ORAL_TABLET | Freq: Every day | ORAL | Status: DC
  Administered 2013-08-14 – 2013-08-17 (×4): 100 mg via ORAL
  Filled 2013-08-14 (×5): qty 1

## 2013-08-14 MED ORDER — SODIUM CHLORIDE 0.9 % IJ SOLN
3.0000 mL | Freq: Two times a day (BID) | INTRAMUSCULAR | Status: DC
Start: 2013-08-14 — End: 2013-08-18
  Administered 2013-08-14 – 2013-08-18 (×7): 3 mL via INTRAVENOUS

## 2013-08-14 MED ORDER — INSULIN DETEMIR 100 UNIT/ML ~~LOC~~ SOLN
7.0000 [IU] | Freq: Two times a day (BID) | SUBCUTANEOUS | Status: DC
  Administered 2013-08-14 – 2013-08-18 (×8): 7 [IU] via SUBCUTANEOUS
  Filled 2013-08-14 (×9): qty 0.07

## 2013-08-14 MED ORDER — ALBUTEROL SULFATE (2.5 MG/3ML) 0.083% IN NEBU
2.5000 mg | INHALATION_SOLUTION | Freq: Four times a day (QID) | RESPIRATORY_TRACT | Status: DC | PRN
  Administered 2013-08-16: 2.5 mg via RESPIRATORY_TRACT
  Filled 2013-08-14: qty 3

## 2013-08-14 NOTE — ED Notes (Signed)
Sent from Nursing Home for evaluation for shortness of breath on home oxygen 2L [place]. States shortness of breath for a while. Denies any pain at this time.  Was given a breathing treatment prior to arrival at Nursing home staff report patient had some relief.

## 2013-08-14 NOTE — ED Notes (Signed)
NTG patch removed from right upper chest due to hypotension.

## 2013-08-14 NOTE — ED Provider Notes (Signed)
CSN: [id number]     Arrival date & time 08/14/13  1256 History   First MD Initiated Contact with Patient 08/14/13 1258     Chief Complaint  Patient presents with  . Shortness of Breath     (Consider location/radiation/quality/duration/timing/severity/associated sxs/prior Treatment) Patient is a 73 y.o. female presenting with shortness of breath. The history is provided by the patient and a relative. No language interpreter was used.  Shortness of Breath Associated symptoms: chest pain   Associated symptoms: no abdominal pain, no cough, no fever, no headaches and no vomiting   This is a 72yo WF w/ PMH dementia, recurrent pleural effusion, CHF, A fib, COPD, CAD, stroke w/ dysarthria and residual weakness to R side who presents from [facility] with c/o SOB. Per pt and daughter in law, patient has been increasingly SOB x 3 days. The nursing home sent patient to the ED today because she had increased oxygen requirement (2LPM-->4LPM). Patient c/o chronic dull central chest pain that has been ongoing for weeks. She has h/o recurrent pleural effusion and has been hospitalized multiple times this year for worsening SOB w/ pleural effusion requiring therapeutic thoracenteses (last thoracentesis was during her last admission 2/25-3/2). Pt denies cough, abd pain, N/V, F/C. She has had nonbloodly diarrhea x 3 weeks. Patient is DNR.   Past Medical History  Diagnosis Date  . Pneumonia   . Pleural effusion   . CHF (congestive heart failure)   . Paroxysmal atrial fibrillation   . COPD (chronic obstructive pulmonary disease)   . Stroke   . Dysarthria due to cerebrovascular accident   . 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 Coronary artery disease   . Diabetes mellitus without complication   . GERD (gastroesophageal reflux disease)   . Dementia    Past Surgical History  Procedure Laterality Date  . Thoracostomy    . Thoracentesis     No family history on  file. History  Substance Use Topics  . Smoking status: Never Smoker   . Smokeless tobacco: Not on file  . Alcohol Use: No   OB History   Grav Para Term Preterm Abortions TAB SAB Ect Mult Living                 Review of Systems  Constitutional: Negative for fever and chills.  Eyes: Negative for visual disturbance.  Respiratory: Positive for shortness of breath. Negative for cough.   Cardiovascular: Positive for chest pain and leg swelling. Negative for palpitations.  Gastrointestinal: Negative for nausea, vomiting, abdominal pain and diarrhea.  Genitourinary: Negative for dysuria.  Neurological: Negative for syncope, facial asymmetry, weakness and headaches.  All other systems reviewed and are negative.      Allergies  Baclofen and Clindamycin/lincomycin  Home Medications   Current Outpatient Rx  Name  Route  Sig  Dispense  Refill  . acetaminophen (TYLENOL) 325 MG tablet   Oral   Take 650 mg by mouth every 4 (four) hours as needed (for pain).         Marland Kitchen albuterol (PROVENTIL) (2.5 MG/3ML) 0.083% nebulizer solution   Nebulization   Take 2.5 mg by nebulization every 6 (six) hours as needed for wheezing or shortness of breath.         Marland Kitchen aspirin (ASPIRIN ADULT LOW STRENGTH) 81 MG chewable tablet   Oral   Chew 81 mg by mouth daily.         Marland Kitchen atorvastatin (LIPITOR) 20 MG tablet  Oral   Take 20 mg by mouth at bedtime.         . clonazePAM (KLONOPIN) 0.5 MG tablet   Oral   Take 1 tablet (0.5 mg total) by mouth every morning.   15 tablet   0   . clonazePAM (KLONOPIN) 1 MG tablet   Oral   Take 1 mg by mouth at bedtime.         . docusate sodium (COLACE) 100 MG capsule   Oral   Take 100 mg by mouth 2 (two) times daily.         . famotidine (PEPCID) 20 MG tablet   Oral   Take 20 mg by mouth 2 (two) times daily.         . furosemide (LASIX) 80 MG tablet   Oral   Take 80 mg by mouth daily.         Marland Kitchen. HYDROcodone-acetaminophen (NORCO/VICODIN)  5-325 MG per tablet   Oral   Take 1 tablet by mouth 3 (three) times daily as needed for moderate pain.         Marland Kitchen. insulin aspart (NOVOLOG) 100 UNIT/ML injection      -9 Units, Subcutaneous, 3 times daily with meals CBG < 70: implement hypoglycemia protocol CBG 70 - 120: 0 units CBG 121 - 150: 1 unit CBG 151 - 200: 2 units CBG 201 - 250: 3 units CBG 251 - 300: 5 units CBG 301 - 350: 7 units CBG 351 - 400: 9 units CBG > 400: call MD   10 mL   11   . insulin detemir (LEVEMIR) 100 UNIT/ML injection   Subcutaneous   Inject 7 Units into the skin 2 (two) times daily.         . metoprolol tartrate (LOPRESSOR) 25 MG tablet   Oral   Take 12.5 mg by mouth daily.          . Multiple Vitamins-Minerals (MULTIVITAMINS THER. W/MINERALS) TABS tablet   Oral   Take 1 tablet by mouth daily.         . nitroGLYCERIN (NITRODUR - DOSED IN MG/24 HR) 0.2 mg/hr patch   Transdermal   Place 0.2 mg onto the skin daily.         . predniSONE (DELTASONE) 10 MG tablet   Oral   Take 3 tablets (30 mg total) by mouth daily with breakfast.   90 tablet   0   . senna-docusate (SENOKOT-S) 8.6-50 MG per tablet   Oral   Take 1 tablet by mouth at bedtime.         . sennosides-docusate sodium (SENOKOT-S) 8.6-50 MG tablet   Oral   Take 1 tablet by mouth daily as needed for constipation ((in addition to daily scheduled dose)).         Marland Kitchen. spironolactone (ALDACTONE) 25 MG tablet   Oral   Take 12.5 mg by mouth daily.         . traZODone (DESYREL) 50 MG tablet   Oral   Take 50 mg by mouth at bedtime.         Marland Kitchen. venlafaxine XR (EFFEXOR-XR) 75 MG 24 hr capsule   Oral   Take 75 mg by mouth daily.         Marland Kitchen. warfarin (COUMADIN) 2 MG tablet   Oral   Take 2-4 mg by mouth daily. Take 2mg  Every Mon, Wed, Fri, and Sat. Take 4mg  all other days  BP 114/94  Pulse 76  Temp(Src) 98.1 F (36.7 C) (Oral)  Resp 24  SpO2 91% Physical Exam  Nursing note and vitals  reviewed. Constitutional: She is oriented to person, place, and time. She appears well-developed and well-nourished.  Patient appears to be working to breath, though no accessory muscle use  HENT:  Head: Normocephalic and atraumatic.  Mouth/Throat: Oropharynx is clear and moist.  Eyes: Conjunctivae and EOM are normal. Pupils are equal, round, and reactive to light.  Cardiovascular: Normal rate.   Irregularly irregular  Pulmonary/Chest:  Somewhat increased WOB, speaking in 4 word sentences, no accessory muscle use; on 4LPM oxygen; diminished breath sounds to bilateral bases posteriorly; some scattered crackles anteriorly  Abdominal: Soft. Bowel sounds are normal. She exhibits no distension. There is no tenderness.  Musculoskeletal: She exhibits edema.  1+ pitting edema to BLE  Neurological: She is alert and oriented to person, place, and time. No cranial nerve deficit.  Residual R sided weakness present to R upper and lower extremities; LUE and LLE 5/5 strength; sensation intact to light touch  Skin: Skin is warm and dry. She is not diaphoretic.    ED Course  Procedures (including critical care time) Labs Review Labs Reviewed  CBC WITH DIFFERENTIAL  COMPREHENSIVE METABOLIC PANEL  PROTIME-INR  TROPONIN I  PRO B NATRIURETIC PEPTIDE  I-STAT ARTERIAL BLOOD GAS, ED  I-STAT CG4 LACTIC ACID, ED   Imaging Review Dg Chest Portable 1 View  08/14/2013   CLINICAL DATA:  Left chest pain. Shortness of breath. Hypertension. Diabetic. Ex-smoker.  EXAM: PORTABLE CHEST - 1 VIEW  COMPARISON:  DG CHEST 2 VIEW dated 07/26/2013  FINDINGS: Advanced than vascular calcifications, including the axillas bilaterally. Midline trachea. Cardiomegaly accentuated by AP portable technique. Small right greater than left pleural effusions. Increased on the left. No pneumothorax. Mildly low lung volumes. Mild to moderate interstitial edema. Right greater than left bibasilar airspace disease which is new/increased. Biapical  pleural parenchymal scarring.  IMPRESSION: Congestive heart failure. New or increased left pleural effusion with increased right pleural fluid. Progressive adjacent atelectasis versus infection.   Electronically Signed   By: Jeronimo Greaves M.D.   On: 08/14/2013 13:36     EKG Interpretation   Date/Time:  Friday August 14 2013 13:11:39 EDT Ventricular Rate:  73 PR Interval:    QRS Duration: 149 QT Interval:  484 QTC Calculation: 533 R Axis:   -58 Text Interpretation:  Atrial fibrillation Ventricular premature complex  Right bundle branch block LVH with IVCD and secondary repol abnrm  Prolonged QT interval No significant change was found Confirmed by Manus Gunning   MD, STEPHEN [phone]) on 08/14/2013 1:37:04 PM      MDM   This is a 72yo WF w/ extensive PMH significant for recurrent pleural effusion, CHF, COPD who presents from nursing home with worsening SOB. CXR shows increased L sided pleural effusion, though stable R side pleural effusion (R side still worse than L) as well as interstitial edema and R>L bibasilar airspace (atelectasis vs PNA).  EKG shows rate controlled A fib w/ PVCs, RBBB and long QT (no significant change from prior).  Will give duoneb, solumedrol 125mg  IV, lasix 40mg  IV. CMP/CBC/troponin/INR/lactate/BNP/ABG pending.   2:51 PM ABG showed pH 7.43, pO2 82, pCO2 37. Lactic acid wnl. CXR shows new L pleural effusion w/ stable R effusion with bibasilar airspace disease c/w PNA vs atelectasis. CBC significant for WBC count 11.1, CMP relatively unrevealing (CKD is stable). BNP elevated to 28,889 and troponin elevated to .36.  Will need to admit patient for treatment of possible PNA and management of her b/l pleural effusions.  4:11 PM I spoke with Dr. Eloise Harman, patient's PCP, he agrees to admit patient. I will place temp admit orders now.   Windell Hummingbird, MD 08/14/13 1612  Windell Hummingbird, MD 08/14/13 [phone]

## 2013-08-14 NOTE — ED Notes (Signed)
Notified IV team requesting IV

## 2013-08-14 NOTE — ED Provider Notes (Signed)
I saw and evaluated the patient, reviewed the resident's note and I agree with the findings and plan. If applicable, I agree with the resident's interpretation of the EKG.  If applicable, I was present for critical portions of any procedures performed.  Worsening SOB x 3 days, increased O2 requirement. Hx recurrent pleural effusions and CHF. No fever or cough. Increased WOB without distress.  Diminished throughout with basilar crackles. Nebs, steroids, lasix. Needs diruesis.  EKG unchanged.  Troponin mildly elevated.    Glynn OctaveStephen Naidelin Gugliotta, MD 08/14/13 2122

## 2013-08-14 NOTE — ED Notes (Signed)
Lactic acid results called to Financial tradersecretary Candice for nurse of patient

## 2013-08-14 NOTE — H&P (Addendum)
Ariana [name] is an 73 y.o. female.   Chief Complaint: having trouble breathing HPI:  The patient is a 73 year old woman who is well known to me. She complains of gradually worsening dyspnea over the past 2-3 days associated with mild pleuritic chest pain, no fever, chills, or productive cough. Today pulse oxygen sats were in the low 80s at the NH on [place] oxygen at 4 lpm so she was sent to the ER for evaluation. Workup showed CHF with worsened bilateral pleural effusions. Currrently, her breathing has improved somewhat. She is having a dry mouth on oxygen and atrovent nebs. She is not having substernal chest pain. She had a DNR order at the SNF. Unfortunately, she had declined to let us closely monitor blood labs at the SNF.  Past Medical History  Diagnosis Date  . Pneumonia   . Pleural effusion   . CHF (congestive heart failure)   . Paroxysmal atrial fibrillation   . COPD (chronic obstructive pulmonary disease)   . Stroke   . Dysarthria due to cerebrovascular accident   . 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 Coronary artery disease   . Diabetes mellitus without complication   . GERD (gastroesophageal reflux disease)   . Dementia     Medications Prior to Admission  Medication Sig Dispense Refill  . acetaminophen (TYLENOL) 325 MG tablet Take 650 mg by mouth every 4 (four) hours as needed (for pain).      Marland Kitchen albuterol (PROVENTIL) (2.5 MG/3ML) 0.083% nebulizer solution Take 2.5 mg by nebulization every 6 (six) hours as needed for wheezing or shortness of breath.      Marland Kitchen aspirin (ASPIRIN ADULT LOW STRENGTH) 81 MG chewable tablet Chew 81 mg by mouth daily.      Marland Kitchen atorvastatin (LIPITOR) 20 MG tablet Take 20 mg by mouth at bedtime.      . clonazePAM (KLONOPIN) 0.5 MG tablet Take 1 tablet (0.5 mg total) by mouth every morning.  15 tablet  0  . clonazePAM (KLONOPIN) 1 MG tablet Take 1 mg by mouth at bedtime.      . docusate sodium (COLACE) 100 MG capsule  Take 100 mg by mouth 2 (two) times daily.      . famotidine (PEPCID) 20 MG tablet Take 20 mg by mouth 2 (two) times daily.      . furosemide (LASIX) 80 MG tablet Take 40 mg by mouth daily.       Marland Kitchen HYDROcodone-acetaminophen (NORCO/VICODIN) 5-325 MG per tablet Take 1 tablet by mouth 3 (three) times daily as needed for moderate pain.      Marland Kitchen insulin aspart (NOVOLOG) 100 UNIT/ML injection Inject 0-9 Units into the skin 3 (three) times daily before meals. <120= no insulin, 121-150= 1 unit, 151-200=2 units, 201-250= 3 units 251-300= 5 units, 301-350= 7 units, 351-400= 9 units, >400= call md      . insulin detemir (LEVEMIR) 100 UNIT/ML injection Inject 7 Units into the skin 2 (two) times daily.      . metoprolol tartrate (LOPRESSOR) 25 MG tablet Take 12.5 mg by mouth daily.       . Multiple Vitamins-Minerals (MULTIVITAMINS THER. W/MINERALS) TABS tablet Take 1 tablet by mouth daily.      . nitroGLYCERIN (NITRODUR - DOSED IN MG/24 HR) 0.2 mg/hr patch Place 0.2 mg onto the skin daily.      . predniSONE (DELTASONE) 10 MG tablet Take 3 tablets (30 mg total) by mouth daily with breakfast.  90 tablet  0  . propranolol (INDERAL) 80 MG tablet Take 80 mg by mouth 2 (two) times daily.      . QUEtiapine (SEROQUEL) 100 MG tablet Take 100 mg by mouth at bedtime.      . senna-docusate (SENOKOT-S) 8.6-50 MG per tablet Take 1 tablet by mouth at bedtime. May take 1 additional tablet daily as needed for constipation      . spironolactone (ALDACTONE) 25 MG tablet Take 12.5 mg by mouth daily.      . traZODone (DESYREL) 50 MG tablet Take 50 mg by mouth at bedtime.      . venlafaxine XR (EFFEXOR-XR) 75 MG 24 hr capsule Take 75 mg by mouth daily.      . warfarin (COUMADIN) 1 MG tablet Take 1 mg by mouth daily.        ADDITIONAL HOME MEDICATIONS: no additional meds.   PHYSICIANS INVOLVED IN CARE:   (PCP at the SNF)  Past Surgical History  Procedure Laterality Date  . Thoracostomy    . Thoracentesis      No  family history on file.   Social History:  reports that she has never smoked. She does not have any smokeless tobacco history on file. She reports that she does not drink alcohol or use illicit drugs.  Allergies:  Allergies  Allergen Reactions  . Baclofen Other (See Comments)    Reaction unknown  . Clindamycin/Lincomycin Other (See Comments)    Reaction unknown     ROS: anemia, ankle swelling, arthritis, diabetes, heart palpitation, high blood pressure, kidney disease, shortness of breath and stroke  PHYSICAL EXAM: Blood pressure 90/41, pulse 69, temperature 98 F (36.7 C), temperature source Oral, resp. rate 18, height 5' 2" (1.575 m), SpO2 94.00%. In general she is a chronically ill white woman who was in NAD on [place] oxygen while lying upright in bed. HEENT is significant for a dry mouth, Neck was supple and without JVD or bruit, Chest had bilateral decreased basilar breath sounds without wheezing or crackles, abdomen had normal bowel sounds and no tenderness, extremities had bilateral 2+ leg edema and everted feet at ankles. She was alert and answered questions appropriately with mild dysarthria, she has mild right hemiparesis.  Results for orders placed during the hospital encounter of 08/14/13 (from the past 48 hour(s))  I-STAT ARTERIAL BLOOD GAS, ED     Status: Abnormal   Collection Time    08/14/13  2:28 PM      Result Value Ref Range   pH, Arterial 7.434  7.350 - 7.450   pCO2 arterial 37.4  35.0 - 45.0 mmHg   pO2, Arterial 82.0  80.0 - 100.0 mmHg   Bicarbonate 25.1 (*) 20.0 - 24.0 mEq/L   TCO2 26  0 - 100 mmol/L   O2 Saturation 96.0     Acid-Base Excess 1.0  0.0 - 2.0 mmol/L   Patient temperature 98.6 F     Collection site RADIAL, ALLEN'S TEST ACCEPTABLE     Drawn by Operator     Sample type ARTERIAL    CBC WITH DIFFERENTIAL     Status: Abnormal   Collection Time    08/14/13  2:31 PM      Result Value Ref Range   WBC 11.1 (*) 4.0 - 10.5 K/uL   RBC 4.26  3.87 - 5.11  MIL/uL   Hemoglobin 10.1 (*) 12.0 - 15.0 g/dL   HCT 33.3 (*) 36.0 - 46.0 %   MCV 78.2  78.0 -   100.0 fL   MCH 23.7 (*) 26.0 - 34.0 pg   MCHC 30.3  30.0 - 36.0 g/dL   RDW 17.8 (*) 11.5 - 15.5 %   Platelets 254  150 - 400 K/uL   Neutrophils Relative % 95 (*) 43 - 77 %   Neutro Abs 10.5 (*) 1.7 - 7.7 K/uL   Lymphocytes Relative 4 (*) 12 - 46 %   Lymphs Abs 0.4 (*) 0.7 - 4.0 K/uL   Monocytes Relative 1 (*) 3 - 12 %   Monocytes Absolute 0.1  0.1 - 1.0 K/uL   Eosinophils Relative 0  0 - 5 %   Eosinophils Absolute 0.0  0.0 - 0.7 K/uL   Basophils Relative 0  0 - 1 %   Basophils Absolute 0.0  0.0 - 0.1 K/uL  COMPREHENSIVE METABOLIC PANEL     Status: Abnormal   Collection Time    08/14/13  2:31 PM      Result Value Ref Range   Sodium 144  137 - 147 mEq/L   Potassium 4.0  3.7 - 5.3 mEq/L   Chloride 104  96 - 112 mEq/L   CO2 24  19 - 32 mEq/L   Glucose, Bld 176 (*) 70 - 99 mg/dL   BUN 46 (*) 6 - 23 mg/dL   Creatinine, Ser 1.82 (*) 0.50 - 1.10 mg/dL   Calcium 8.8  8.4 - 10.5 mg/dL   Total Protein 6.5  6.0 - 8.3 g/dL   Albumin 2.7 (*) 3.5 - 5.2 g/dL   AST 27  0 - 37 U/L   ALT 29  0 - 35 U/L   Alkaline Phosphatase 71  39 - 117 U/L   Total Bilirubin 0.3  0.3 - 1.2 mg/dL   GFR calc non Af Amer 27 (*) >90 mL/min   GFR calc Af Amer 31 (*) >90 mL/min   Comment: (NOTE)     The eGFR has been calculated using the CKD EPI equation.     This calculation has not been validated in all clinical situations.     eGFR's persistently <90 mL/min signify possible Chronic Kidney     Disease.  PROTIME-INR     Status: None   Collection Time    08/14/13  2:31 PM      Result Value Ref Range   Prothrombin Time 13.5  11.6 - 15.2 seconds   INR 1.05  0.00 - 1.49  TROPONIN I     Status: Abnormal   Collection Time    08/14/13  2:31 PM      Result Value Ref Range   Troponin I 0.36 (*) <0.30 ng/mL   Comment:            Due to the release kinetics of cTnI,     a negative result within the first hours     of  the onset of symptoms does not rule out     myocardial infarction with certainty.     If myocardial infarction is still suspected,     repeat the test at appropriate intervals.     CRITICAL RESULT CALLED TO, READ BACK BY AND VERIFIED WITH:     Teodoro Kil [id number] 08/14/13 D BRADLEY  PRO B NATRIURETIC PEPTIDE     Status: Abnormal   Collection Time    08/14/13  2:31 PM      Result Value Ref Range   Pro B Natriuretic peptide (BNP) 28889.0 (*) 0 - 125 pg/mL  I-STAT CG4 LACTIC ACID,  ED     Status: None   Collection Time    08/14/13  2:33 PM      Result Value Ref Range   Lactic Acid, Venous 0.80  0.5 - 2.2 mmol/L   Dg Chest Portable 1 View  08/14/2013   CLINICAL DATA:  Left chest pain. Shortness of breath. Hypertension. Diabetic. Ex-smoker.  EXAM: PORTABLE CHEST - 1 VIEW  COMPARISON:  DG CHEST 2 VIEW dated 07/26/2013  FINDINGS: Advanced than vascular calcifications, including the axillas bilaterally. Midline trachea. Cardiomegaly accentuated by AP portable technique. Small right greater than left pleural effusions. Increased on the left. No pneumothorax. Mildly low lung volumes. Mild to moderate interstitial edema. Right greater than left bibasilar airspace disease which is new/increased. Biapical pleural parenchymal scarring.  IMPRESSION: Congestive heart failure. New or increased left pleural effusion with increased right pleural fluid. Progressive adjacent atelectasis versus infection.   Electronically Signed   By: Kyle  Talbot M.D.   On: 08/14/2013 13:36  December 2014 Transthoracic Echocardiogram showed difficult study with preserved LV systolic function with mild AI   Assessment/Plan #1 Acute Respiratory Failure: due to CHF with associated worsened bilateral pleural effusions. Doubt that she has pneumonia given her lack of a productive cough or fever. Will treat with high dose lasix, aldactone and steroids, request bilateral therapeutic thoracenteses, with plan for transfer back to the SNF when  condition has stabilized. #2 Paroxysmal Atrial Fibrillation: stable and will hold coumadin for now  #3 DM2: stable on insulin.   , G 08/14/2013, 8:18 PM    

## 2013-08-14 NOTE — ED Notes (Signed)
Attempted to stick patient times 2 and not successful.  Paged IV team.

## 2013-08-14 NOTE — ED Notes (Signed)
Pt resting quietly at this time.  St's breathing is better.  No complaints voiced.

## 2013-08-15 LAB — CBC
HCT: 27.7 % — ABNORMAL LOW (ref 36.0–46.0)
Hemoglobin: 8.3 g/dL — ABNORMAL LOW (ref 12.0–15.0)
MCH: 23.2 pg — AB (ref 26.0–34.0)
MCHC: 30 g/dL (ref 30.0–36.0)
MCV: 77.4 fL — AB (ref 78.0–100.0)
PLATELETS: 268 10*3/uL (ref 150–400)
RBC: 3.58 MIL/uL — ABNORMAL LOW (ref 3.87–5.11)
RDW: 17.8 % — ABNORMAL HIGH (ref 11.5–15.5)
WBC: 9.9 10*3/uL (ref 4.0–10.5)

## 2013-08-15 LAB — BASIC METABOLIC PANEL
BUN: 48 mg/dL — ABNORMAL HIGH (ref 6–23)
CALCIUM: 8.2 mg/dL — AB (ref 8.4–10.5)
CO2: 25 mEq/L (ref 19–32)
Chloride: 99 mEq/L (ref 96–112)
Creatinine, Ser: 2.03 mg/dL — ABNORMAL HIGH (ref 0.50–1.10)
GFR calc Af Amer: 27 mL/min — ABNORMAL LOW (ref >90)
GFR, EST NON AFRICAN AMERICAN: 24 mL/min — AB (ref >90)
Glucose, Bld: 169 mg/dL — ABNORMAL HIGH (ref 70–99)
Potassium: 4.2 mEq/L (ref 3.7–5.3)
Sodium: 138 mEq/L (ref 137–147)

## 2013-08-15 LAB — GLUCOSE, CAPILLARY
GLUCOSE-CAPILLARY: 170 mg/dL — AB (ref 70–99)
Glucose-Capillary: 174 mg/dL — ABNORMAL HIGH (ref 70–99)
Glucose-Capillary: 149 mg/dL — ABNORMAL HIGH (ref 70–99)
Glucose-Capillary: 152 mg/dL — ABNORMAL HIGH (ref 70–99)
Glucose-Capillary: 115 mg/dL — ABNORMAL HIGH (ref 70–99)
Glucose-Capillary: 156 mg/dL — ABNORMAL HIGH (ref 70–99)

## 2013-08-15 LAB — TROPONIN I: Troponin I: <0.3 ng/mL (ref <0.30)

## 2013-08-15 LAB — PROTIME-INR
INR: 1.19 (ref 0.00–1.49)
Prothrombin Time: 14.8 seconds (ref 11.6–15.2)

## 2013-08-15 LAB — MRSA PCR SCREENING: MRSA BY PCR: NEGATIVE

## 2013-08-15 LAB — PRO B NATRIURETIC PEPTIDE: PRO B NATRI PEPTIDE: 27918 pg/mL — AB (ref 0–125)

## 2013-08-15 NOTE — Progress Notes (Signed)
Subjective: Repeatedly responds "I don't want it" when discussing thoracentesis.  Reports that her breathing has improved.    Objective: Vital signs in last 24 hours: Temp:  [97.8 F (36.6 C)-98.2 F (36.8 C)] 98.2 F (36.8 C) (03/21 0654) Pulse Rate:  [33-102] 77 (03/21 0654) Resp:  [18-24] 19 (03/21 0654) BP: (77-122)/(34-94) 112/84 mmHg (03/21 0654) SpO2:  [91 %-100 %] 92 % (03/21 0654) Weight:  [79.107 kg (174 lb 6.4 oz)] 79.107 kg (174 lb 6.4 oz) (03/21 0654) Weight change:     CBG (last 3)   Recent Labs  08/15/13 0109 08/15/13 0208 08/15/13 0648  GLUCAP 174* 170* 149*    Intake/Output from previous day: 03/20 0701 - 03/21 0700 In: 76.3 [I.V.:76.3] Out: -  Intake/Output this shift:    General appearance: alert and no distress Eyes: no scleral icterus Throat: oropharynx moist without erythema Resp: decreased breath sounds bilateral bases Cardio: regular rate and rhythm GI: soft, non-tender; bowel sounds normal; no masses,  no organomegaly Extremities: no clubbing, cyanosis; 1+ bilateral lower extremity edema  Lab Results:  Recent Labs  08/14/13 1431  NA 144  K 4.0  CL 104  CO2 24  GLUCOSE 176*  BUN 46*  CREATININE 1.82*  CALCIUM 8.8    Recent Labs  08/14/13 1431  AST 27  ALT 29  ALKPHOS 71  BILITOT 0.3  PROT 6.5  ALBUMIN 2.7*    Recent Labs  08/14/13 1431  WBC 11.1*  NEUTROABS 10.5*  HGB 10.1*  HCT 33.3*  MCV 78.2  PLT 254   Lab Results  Component Value Date   INR 1.05 08/14/2013   INR 2.06* 07/27/2013   INR 4.10* 07/26/2013    Recent Labs  08/14/13 1431  TROPONINI 0.36*   No results found for this basename: TSH, T4TOTAL, FREET3, T3FREE, THYROIDAB,  in the last 72 hours No results found for this basename: VITAMINB12, FOLATE, FERRITIN, TIBC, IRON, RETICCTPCT,  in the last 72 hours  Studies/Results: Dg Chest Portable 1 View  08/14/2013   CLINICAL DATA:  Left chest pain. Shortness of breath. Hypertension. Diabetic. Ex-smoker.   EXAM: PORTABLE CHEST - 1 VIEW  COMPARISON:  DG CHEST 2 VIEW dated 07/26/2013  FINDINGS: Advanced than vascular calcifications, including the axillas bilaterally. Midline trachea. Cardiomegaly accentuated by AP portable technique. Small right greater than left pleural effusions. Increased on the left. No pneumothorax. Mildly low lung volumes. Mild to moderate interstitial edema. Right greater than left bibasilar airspace disease which is new/increased. Biapical pleural parenchymal scarring.  IMPRESSION: Congestive heart failure. New or increased left pleural effusion with increased right pleural fluid. Progressive adjacent atelectasis versus infection.   Electronically Signed   By: Jeronimo GreavesKyle  Talbot M.D.   On: 08/14/2013 13:36     Medications: Scheduled: . albuterol  2.5 mg Nebulization Q6H  . aspirin EC  81 mg Oral Daily  . atorvastatin  20 mg Oral QHS  . clonazePAM  0.5 mg Oral q morning - 10a  . clonazePAM  1 mg Oral QHS  . docusate sodium  100 mg Oral BID  . enoxaparin (LOVENOX) injection  30 mg Subcutaneous Q24H  . famotidine  20 mg Oral BID  . furosemide  40 mg Intravenous BID  . insulin aspart  0-9 Units Subcutaneous TID AC  . insulin detemir  7 Units Subcutaneous BID  . metoprolol tartrate  12.5 mg Oral BID  . multivitamin with minerals  1 tablet Oral Daily  . nitroGLYCERIN  0.2 mg Transdermal Daily  .  predniSONE  30 mg Oral Q breakfast  . QUEtiapine  100 mg Oral QHS  . senna-docusate  1 tablet Oral QHS  . sodium chloride  3 mL Intravenous Q12H  . spironolactone  25 mg Oral Daily  . tiotropium  18 mcg Inhalation Daily  . traZODone  50 mg Oral QHS  . venlafaxine XR  75 mg Oral Daily   Continuous: . 0.9 % NaCl with KCl 20 mEq / L 20 mL/hr at 08/14/13 2211    Assessment/Plan: Principal Problem: 1. Acute respiratory failure with hypoxia secondary to Acute diastolic heart failure with Recurrent bilateral pleural effusions- EF normal to mildly decreased on Echo (12/14)- declines  ultrasound-guided thoracentesis.  Continue Lasix IV, Spironolactone with close monitoring of weights, I/Os and renal parameters.  Appears improved today.  Repeat CXR with decubitus films tomorrow. 2. Paroxysmal atrial fibrillation- Coumadin on hold (will resume tomorrow if stable).  INR pending.  Continue rate control. 3. CKD (chronic kidney disease), stage III- monitor with diuresis.  Labs ordered.  4. COPD- continue Spiriva, Prednisone and Albuterol, O2. 5. Diabetes Mellitus- continue Levemir, SSI.  Anticipate hyperglycemia with steroids. 6. Disposition- anticipate discharge to [facility] on Monday if stable.   LOS: 1 day   Martha Clan 08/15/2013, 8:04 AM

## 2013-08-16 ENCOUNTER — Inpatient Hospital Stay (HOSPITAL_COMMUNITY): Payer: Medicare Other

## 2013-08-16 LAB — PROTIME-INR
INR: 1.15 (ref 0.00–1.49)
PROTHROMBIN TIME: 14.5 s (ref 11.6–15.2)

## 2013-08-16 LAB — BASIC METABOLIC PANEL
BUN: 54 mg/dL — ABNORMAL HIGH (ref 6–23)
CHLORIDE: 103 meq/L (ref 96–112)
CO2: 24 meq/L (ref 19–32)
CREATININE: 2.26 mg/dL — AB (ref 0.50–1.10)
Calcium: 8.1 mg/dL — ABNORMAL LOW (ref 8.4–10.5)
GFR calc Af Amer: 24 mL/min — ABNORMAL LOW (ref >90)
GFR calc non Af Amer: 21 mL/min — ABNORMAL LOW (ref >90)
Glucose, Bld: 97 mg/dL (ref 70–99)
Potassium: 4.4 mEq/L (ref 3.7–5.3)
SODIUM: 140 meq/L (ref 137–147)

## 2013-08-16 LAB — CBC
HCT: 26.8 % — ABNORMAL LOW (ref 36.0–46.0)
HEMOGLOBIN: 8.2 g/dL — AB (ref 12.0–15.0)
MCH: 23.9 pg — ABNORMAL LOW (ref 26.0–34.0)
MCHC: 30.6 g/dL (ref 30.0–36.0)
MCV: 78.1 fL (ref 78.0–100.0)
Platelets: 251 10*3/uL (ref 150–400)
RBC: 3.43 MIL/uL — AB (ref 3.87–5.11)
RDW: 17.7 % — ABNORMAL HIGH (ref 11.5–15.5)
WBC: 8.4 10*3/uL (ref 4.0–10.5)

## 2013-08-16 LAB — GLUCOSE, CAPILLARY
GLUCOSE-CAPILLARY: 104 mg/dL — AB (ref 70–99)
GLUCOSE-CAPILLARY: 165 mg/dL — AB (ref 70–99)
Glucose-Capillary: 227 mg/dL — ABNORMAL HIGH (ref 70–99)
Glucose-Capillary: 171 mg/dL — ABNORMAL HIGH (ref 70–99)

## 2013-08-16 NOTE — Progress Notes (Signed)
After cleaning patient up along with Charge RN pt. Attempted to pull herself up in the bed and her I.V. Dislodged. I attempted to restart I.V. In both left and right forearms and anticubitals but unsuccessful. I advised oncoming RN and paged I.V. Team.

## 2013-08-16 NOTE — Progress Notes (Signed)
Subjective: Patient reports that she feels better.  Willing to proceed with thoracentesis now if needed.     Objective: Vital signs in last 24 hours: Temp:  [98 F (36.7 C)-98.2 F (36.8 C)] 98 F (36.7 C) (03/22 0624) Pulse Rate:  [55-69] 64 (03/22 0816) Resp:  [18-20] 18 (03/22 0624) BP: (101-151)/(52-65) 151/63 mmHg (03/22 0816) SpO2:  [75 %-100 %] 97 % (03/22 0734) FiO2 (%):  [2.5 %] 2.5 % (03/21 1404) Weight:  [79.153 kg (174 lb 8 oz)] 79.153 kg (174 lb 8 oz) (03/22 0624) Weight change: 0.045 kg (1.6 oz)    CBG (last 3)   Recent Labs  08/15/13 1637 08/15/13 2116 08/16/13 0603  GLUCAP 115* 156* 104*    Intake/Output from previous day: 03/21 0701 - 03/22 0700 In: 1682 [P.O.:1422; I.V.:260] Out: -  Intake/Output this shift:    General appearance: alert and no distress Eyes: no scleral icterus Throat: oropharynx moist without erythema Resp: decreased breath sounds bilateral bases Cardio: regular rate and rhythm GI: soft, non-tender; bowel sounds normal; no masses,  no organomegaly Extremities: no clubbing, cyanosis; trace edema   Lab Results:  Recent Labs  08/15/13 1030 08/16/13 0657  NA 138 140  K 4.2 4.4  CL 99 103  CO2 25 24  GLUCOSE 169* 97  BUN 48* 54*  CREATININE 2.03* 2.26*  CALCIUM 8.2* 8.1*    Recent Labs  08/14/13 1431  AST 27  ALT 29  ALKPHOS 71  BILITOT 0.3  PROT 6.5  ALBUMIN 2.7*    Recent Labs  08/14/13 1431 08/15/13 1030 08/16/13 0657  WBC 11.1* 9.9 8.4  NEUTROABS 10.5*  --   --   HGB 10.1* 8.3* 8.2*  HCT 33.3* 27.7* 26.8*  MCV 78.2 77.4* 78.1  PLT 254 268 251   Lab Results  Component Value Date   INR 1.15 08/16/2013   INR 1.19 08/15/2013   INR 1.05 08/14/2013    Recent Labs  08/14/13 1431 08/15/13 1100  TROPONINI 0.36* <0.30   BNP    Component Value Date/Time   PROBNP 27918.0* 08/15/2013 1030    Studies/Results: Dg Chest Portable 1 View  08/14/2013   CLINICAL DATA:  Left chest pain. Shortness of  breath. Hypertension. Diabetic. Ex-smoker.  EXAM: PORTABLE CHEST - 1 VIEW  COMPARISON:  DG CHEST 2 VIEW dated 07/26/2013  FINDINGS: Advanced than vascular calcifications, including the axillas bilaterally. Midline trachea. Cardiomegaly accentuated by AP portable technique. Small right greater than left pleural effusions. Increased on the left. No pneumothorax. Mildly low lung volumes. Mild to moderate interstitial edema. Right greater than left bibasilar airspace disease which is new/increased. Biapical pleural parenchymal scarring.  IMPRESSION: Congestive heart failure. New or increased left pleural effusion with increased right pleural fluid. Progressive adjacent atelectasis versus infection.   Electronically Signed   By: Jeronimo GreavesKyle  Talbot M.D.   On: 08/14/2013 13:36     Medications: Scheduled: . albuterol  2.5 mg Nebulization Q6H  . aspirin EC  81 mg Oral Daily  . atorvastatin  20 mg Oral QHS  . clonazePAM  0.5 mg Oral q morning - 10a  . clonazePAM  1 mg Oral QHS  . docusate sodium  100 mg Oral BID  . enoxaparin (LOVENOX) injection  30 mg Subcutaneous Q24H  . famotidine  20 mg Oral BID  . furosemide  40 mg Intravenous BID  . insulin aspart  0-9 Units Subcutaneous TID AC  . insulin detemir  7 Units Subcutaneous BID  . metoprolol tartrate  12.5 mg Oral BID  . multivitamin with minerals  1 tablet Oral Daily  . nitroGLYCERIN  0.2 mg Transdermal Daily  . predniSONE  30 mg Oral Q breakfast  . QUEtiapine  100 mg Oral QHS  . senna-docusate  1 tablet Oral QHS  . sodium chloride  3 mL Intravenous Q12H  . spironolactone  25 mg Oral Daily  . tiotropium  18 mcg Inhalation Daily  . traZODone  50 mg Oral QHS  . venlafaxine XR  75 mg Oral Daily   Continuous: . 0.9 % NaCl with KCl 20 mEq / L 20 mL/hr at 08/14/13 2211    Assessment/Plan: Principal Problem:  1. Acute respiratory failure with hypoxia secondary to Acute diastolic heart failure with Recurrent bilateral pleural effusions- EF normal to mildly  decreased on Echo (12/14)- Continue Lasix IV, Spironolactone with close monitoring of weights, I/Os and renal parameters. Weight unchanged X 24 hours and I/Os poorly recorded due to incontinence.  Repeat CXR with decubitus films today and consider therapeutic thoracentesis if no improvement with diuresis.  2. Paroxysmal atrial fibrillation- Coumadin on hold (will resume tomorrow if no paracentesis needed). Continue rate control.  3. CKD (chronic kidney disease), stage III- monitor with diuresis. 4. COPD with acute exacerbation- continue Spiriva, Prednisone and Albuterol, O2.  5. Diabetes Mellitus- continue Levemir, SSI. Anticipate hyperglycemia with steroids.  6. Disposition- anticipate discharge to [facility] in 1-2 days if stable (pending possible thoracentesis).   LOS: 2 days   Ariana [name] 08/16/2013, 8:38 AM

## 2013-08-17 ENCOUNTER — Inpatient Hospital Stay (HOSPITAL_COMMUNITY): Payer: Medicare Other

## 2013-08-17 DIAGNOSIS — I5031 Acute diastolic (congestive) heart failure: Secondary | ICD-10-CM | POA: Diagnosis not present

## 2013-08-17 DIAGNOSIS — R0602 Shortness of breath: Secondary | ICD-10-CM | POA: Diagnosis not present

## 2013-08-17 LAB — CBC
HCT: 27.4 % — ABNORMAL LOW (ref 36.0–46.0)
Hemoglobin: 8.4 g/dL — ABNORMAL LOW (ref 12.0–15.0)
MCH: 24.1 pg — ABNORMAL LOW (ref 26.0–34.0)
MCHC: 30.7 g/dL (ref 30.0–36.0)
MCV: 78.7 fL (ref 78.0–100.0)
Platelets: 241 10*3/uL (ref 150–400)
RBC: 3.48 MIL/uL — ABNORMAL LOW (ref 3.87–5.11)
RDW: 17.6 % — ABNORMAL HIGH (ref 11.5–15.5)
WBC: 10.2 10*3/uL (ref 4.0–10.5)

## 2013-08-17 LAB — PROTIME-INR
INR: 0.98 (ref 0.00–1.49)
Prothrombin Time: 12.8 seconds (ref 11.6–15.2)

## 2013-08-17 LAB — BASIC METABOLIC PANEL
BUN: 58 mg/dL — ABNORMAL HIGH (ref 6–23)
CO2: 24 mEq/L (ref 19–32)
Calcium: 8 mg/dL — ABNORMAL LOW (ref 8.4–10.5)
Chloride: 102 mEq/L (ref 96–112)
Creatinine, Ser: 2.17 mg/dL — ABNORMAL HIGH (ref 0.50–1.10)
GFR, EST AFRICAN AMERICAN: 25 mL/min — AB (ref >90)
GFR, EST NON AFRICAN AMERICAN: 22 mL/min — AB (ref >90)
Glucose, Bld: 139 mg/dL — ABNORMAL HIGH (ref 70–99)
POTASSIUM: 4.4 meq/L (ref 3.7–5.3)
SODIUM: 139 meq/L (ref 137–147)

## 2013-08-17 LAB — GLUCOSE, CAPILLARY
GLUCOSE-CAPILLARY: 208 mg/dL — AB (ref 70–99)
GLUCOSE-CAPILLARY: 344 mg/dL — AB (ref 70–99)
GLUCOSE-CAPILLARY: 112 mg/dL — AB (ref 70–99)
Glucose-Capillary: 136 mg/dL — ABNORMAL HIGH (ref 70–99)

## 2013-08-17 LAB — PRO B NATRIURETIC PEPTIDE: PRO B NATRI PEPTIDE: 22098 pg/mL — AB (ref 0–125)

## 2013-08-17 MED ORDER — ALBUTEROL SULFATE (2.5 MG/3ML) 0.083% IN NEBU: 2.5000 mg | INHALATION_SOLUTION | Freq: Two times a day (BID) | RESPIRATORY_TRACT | Status: DC

## 2013-08-17 NOTE — Progress Notes (Signed)
Subjective: Moderate dyspnea when I came into the room with her on room air, improved to mild dyspnea with [place] oxygen restarted.   Objective: Vital signs in last 24 hours: Temp:  [97.9 F (36.6 C)-98.4 F (36.9 C)] 98.4 F (36.9 C) (03/23 0442) Pulse Rate:  [64-79] 71 (03/23 0442) Resp:  [18-19] 18 (03/23 0442) BP: (100-151)/(56-89) 100/56 mmHg (03/23 0442) SpO2:  [95 %-98 %] 96 % (03/23 0442) Weight:  [81.965 kg (180 lb 11.2 oz)] 81.965 kg (180 lb 11.2 oz) (03/23 0442) Weight change: 2.812 kg (6 lb 3.2 oz)   Intake/Output from previous day: 03/22 0701 - 03/23 0700 In: 720 [P.O.:720] Out: -    General appearance: alert, cooperative and mild distress Resp: decreased breath sounds bilateral lung bases Cardio: regular rate and rhythm and with 2/6 SEM GI: soft, non-tender; bowel sounds normal; no masses,  no organomegaly Extremities: extremities normal, atraumatic, no cyanosis or edema  Lab Results:  Recent Labs  08/16/13 0657 08/17/13 0458  WBC 8.4 10.2  HGB 8.2* 8.4*  HCT 26.8* 27.4*  PLT 251 241   BMET  Recent Labs  08/16/13 0657 08/17/13 0458  NA 140 139  K 4.4 4.4  CL 103 102  CO2 24 24  GLUCOSE 97 139*  BUN 54* 58*  CREATININE 2.26* 2.17*  CALCIUM 8.1* 8.0*   CMET CMP     Component Value Date/Time   NA 139 08/17/2013 0458   K 4.4 08/17/2013 0458   CL 102 08/17/2013 0458   CO2 24 08/17/2013 0458   GLUCOSE 139* 08/17/2013 0458   BUN 58* 08/17/2013 0458   CREATININE 2.17* 08/17/2013 0458   CALCIUM 8.0* 08/17/2013 0458   PROT 6.5 08/14/2013 1431   ALBUMIN 2.7* 08/14/2013 1431   AST 27 08/14/2013 1431   ALT 29 08/14/2013 1431   ALKPHOS 71 08/14/2013 1431   BILITOT 0.3 08/14/2013 1431   GFRNONAA 22* 08/17/2013 0458   GFRAA 25* 08/17/2013 0458    CBG (last 3)   Recent Labs  08/16/13 1146 08/16/13 1843 08/16/13 2150  GLUCAP 165* 227* 171*    INR RESULTS:   Lab Results  Component Value Date   INR 0.98 08/17/2013   INR 1.15 08/16/2013   INR 1.19  08/15/2013     Studies/Results: Dg Chest 2 View  08/17/2013   CLINICAL DATA:  Pleural effusions.  EXAM: CHEST  2 VIEW  COMPARISON:  DG CHEST BILATERAL DECUBITUS dated 08/16/2013; DG CHEST 1V PORT dated 08/14/2013  FINDINGS: Cardiomegaly with pulmonary vascular prominence and interstitial prominence and bilateral pleural effusions. Similar findings noted on prior study. Atelectasis and/or infiltrate is in the lung bases. No pneumothorax. Prior lumbar spine fusion.  IMPRESSION: Congestive heart failure with pulmonary edema and bilateral pleural effusions, no significant change from prior exam.   Electronically Signed   By: Maisie Fushomas  Register   On: 08/17/2013 07:19   Dg Chest Bilateral Decubitus  08/17/2013   CLINICAL DATA:  Pleural effusions.  EXAM: CHEST - BILATERAL DECUBITUS VIEW  COMPARISON:  DG CHEST 1V PORT dated 08/14/2013  FINDINGS: Bilateral layering pleural effusions are present. Changes of congestive heart failure with pulmonary edema noted. No pneumothorax.  IMPRESSION: 1. Layering pleural effusions. 2. Congestive heart failure pulmonary edema.   Electronically Signed   By: Maisie Fushomas  Register   On: 08/17/2013 07:20    Medications: I have reviewed the patient's current medications.  Assessment/Plan: #1 Dyspnea: due to CHF with bilateral pleural effusions, needs thoracentesis for comfort which we will try  to obtain today.Likely discharge to the SNF tomorrow. #2 Acute renal insufficiency: improved creatinine level versus yesterday and will need to be monitored closely. #3 DM2: stable sugars on SSI   LOS: 3 days   Placida Cambre G 08/17/2013, 7:47 AM

## 2013-08-17 NOTE — Procedures (Signed)
Successful US guided right thoracentesis. Yielded 1.2 liters of clear yellow fluid. Pt tolerated procedure well. No immediate complications.  Specimen was not sent for labs. CXR ordered.  Pattricia BossMORGAN, Kamilah Correia D PA-C 08/17/2013 2:38 PM

## 2013-08-17 NOTE — Progress Notes (Signed)
Patient was grimacing and when asked if she was in pain patient stated that she was uncomfortable and aching everywhere. PRN Vicodin administered as ordered and patient repositioned. Patient was also SOB with expiratory wheezing.  PRN Albuterol Neb given. Patient currently resting comfortably, will continue to monitor. Ariana [name], Mounir Skipper M

## 2013-08-17 NOTE — Progress Notes (Signed)
Pt in bed with son at the bedside.  A&0x2 name and place.  Reoriented as needed.  Call bell at reach.  Instructed call for assistance as needed.  Verbalized understanding.

## 2013-08-18 LAB — BASIC METABOLIC PANEL
BUN: 55 mg/dL — AB (ref 6–23)
CALCIUM: 8.3 mg/dL — AB (ref 8.4–10.5)
CO2: 24 mEq/L (ref 19–32)
Chloride: 104 mEq/L (ref 96–112)
Creatinine, Ser: 1.89 mg/dL — ABNORMAL HIGH (ref 0.50–1.10)
GFR calc Af Amer: 30 mL/min — ABNORMAL LOW (ref >90)
GFR calc non Af Amer: 26 mL/min — ABNORMAL LOW (ref >90)
GLUCOSE: 96 mg/dL (ref 70–99)
POTASSIUM: 4.5 meq/L (ref 3.7–5.3)
SODIUM: 141 meq/L (ref 137–147)

## 2013-08-18 LAB — GLUCOSE, CAPILLARY
GLUCOSE-CAPILLARY: 96 mg/dL (ref 70–99)
Glucose-Capillary: 259 mg/dL — ABNORMAL HIGH (ref 70–99)
Glucose-Capillary: 133 mg/dL — ABNORMAL HIGH (ref 70–99)

## 2013-08-18 LAB — CBC
HCT: 27.9 % — ABNORMAL LOW (ref 36.0–46.0)
HEMOGLOBIN: 8.6 g/dL — AB (ref 12.0–15.0)
MCH: 24 pg — AB (ref 26.0–34.0)
MCHC: 30.8 g/dL (ref 30.0–36.0)
MCV: 77.9 fL — ABNORMAL LOW (ref 78.0–100.0)
Platelets: 232 10*3/uL (ref 150–400)
RBC: 3.58 MIL/uL — ABNORMAL LOW (ref 3.87–5.11)
RDW: 17.3 % — AB (ref 11.5–15.5)
WBC: 9.6 10*3/uL (ref 4.0–10.5)

## 2013-08-18 LAB — PROTIME-INR
INR: 1.14 (ref 0.00–1.49)
Prothrombin Time: 14.4 seconds (ref 11.6–15.2)

## 2013-08-18 MED ORDER — TIOTROPIUM BROMIDE MONOHYDRATE 18 MCG IN CAPS: 18.0000 ug | ORAL_CAPSULE | Freq: Every day | RESPIRATORY_TRACT | Status: AC

## 2013-08-18 MED ORDER — FAMOTIDINE 20 MG PO TABS
20.0000 mg | ORAL_TABLET | Freq: Every day | ORAL | Status: DC
  Administered 2013-08-18: 20 mg via ORAL
  Filled 2013-08-18: qty 1

## 2013-08-18 MED ORDER — METOPROLOL TARTRATE 25 MG PO TABS: 12.5000 mg | ORAL_TABLET | Freq: Two times a day (BID) | ORAL | Status: DC

## 2013-08-18 MED ORDER — SPIRONOLACTONE 25 MG PO TABS: 25.0000 mg | ORAL_TABLET | Freq: Every day | ORAL | Status: AC

## 2013-08-18 MED ORDER — ALBUTEROL SULFATE (2.5 MG/3ML) 0.083% IN NEBU: 2.5000 mg | INHALATION_SOLUTION | Freq: Two times a day (BID) | RESPIRATORY_TRACT | Status: AC

## 2013-08-18 MED ORDER — FUROSEMIDE 80 MG PO TABS
80.0000 mg | ORAL_TABLET | Freq: Every day | ORAL | Status: DC
Start: 2013-08-18 — End: 2013-08-24

## 2013-08-18 MED ORDER — WARFARIN SODIUM 2 MG PO TABS: 2.0000 mg | ORAL_TABLET | Freq: Every day | ORAL | Status: DC

## 2013-08-18 NOTE — Progress Notes (Signed)
Report called to Joy, nurse at [facility] that will be receiving pt.  Verbalized understanding.  Will  Continue to monitor.  On coming nurse made aware.  Amanda PeaNellie Tiarna Koppen, Charity fundraiserN.

## 2013-08-18 NOTE — Clinical Social Work Psychosocial (Signed)
I have read and reviewed BSW intern's note and agree with content. Plan return to [facility] when medically stable.  Lorri Frederickonna T. West PughCrowder, LCSWA  [phone]

## 2013-08-18 NOTE — Progress Notes (Signed)
PTAR here to [name], Berle MullLynn Trevis Eden RN

## 2013-08-18 NOTE — Discharge Summary (Signed)
Physician Discharge Summary  Patient ID: Ariana [name] MRN: [id number] DOB/AGE: 09/08/1940 73 y.o.  Admit date: 08/14/2013 Discharge date: 08/18/2013   Discharge Diagnoses:  Principal Problem:   Acute respiratory failure with hypoxia Active Problems:   Recurrent pleural effusion on right   Paroxysmal atrial fibrillation   Dyspnea   Acute diastolic heart failure   Dysarthria due to cerebrovascular accident   CKD (chronic kidney disease), stage III   Pleural effusion   Discharged Condition: fair  Hospital Course: The patient is a 73 year old woman who is well known to me. She complains of gradually worsening dyspnea over the past 2-3 days associated with mild pleuritic chest pain, no fever, chills, or productive cough. Today pulse oxygen sats were in the low 80s at the NH on [place] oxygen at 4 lpm so she was sent to the ER for evaluation. Workup showed CHF with worsened bilateral pleural effusions. Currrently, her breathing has improved somewhat. She is having a dry mouth on oxygen and atrovent nebs. She is not having substernal chest pain. She had a DNR order at the SNF. Unfortunately, she had declined to let us closely monitor blood labs at the SNF.   Workup in the emergency room showed an elevated BNP test and chest x-ray consistent with acute congestive heart failure. She had a mildly elevated troponin I level that resolved overnight. EKG did not show acute abnormalities. She was treated with nebulizers and high-dose diuretics. In addition, she had a right-sided thoracentesis done with over 1 L of fluid removed it did not appear purulent. On the day of discharge she noted that her breathing was much improved and she was able to eat most of her breakfast. She's not having problems with chest pain, palpitations, productive cough, abdominal pain, or leg edema. The plan will be to have her return to the skilled nursing facility today on higher dose of diuretic treatment and Aldactone with close  monitoring of her electrolytes and renal function and body weight. She did not have an echocardiogram done as she had had one during a previous admission in December 2014.    Consults: None  Significant Diagnostic Studies:  Dg Chest 1 View  08/17/2013   CLINICAL DATA:  Post right-sided thoracentesis.  EXAM: CHEST - 1 VIEW  COMPARISON:  08/16/2013  FINDINGS: Decrease in size of right-sided pleural effusion. No gross pneumothorax.  Artifact extends over right lung apex.  Cardiomegaly. Pulmonary vascular congestion. Left-sided pleural effusion. Bibasilar atelectasis. Limited for excluding basilar infiltrate given the pleural fluid and atelectasis. Appearance unchanged.  Calcified aorta.  IMPRESSION: Decrease in size of right-sided pleural effusion post thoracentesis. No pneumothorax detected. Please see above.   Electronically Signed   By: Bridgett Larsson M.D.   On: 08/17/2013 15:11   Dg Chest 2 View  08/17/2013   CLINICAL DATA:  Pleural effusions.  EXAM: CHEST  2 VIEW  COMPARISON:  DG CHEST BILATERAL DECUBITUS dated 08/16/2013; DG CHEST 1V PORT dated 08/14/2013  FINDINGS: Cardiomegaly with pulmonary vascular prominence and interstitial prominence and bilateral pleural effusions. Similar findings noted on prior study. Atelectasis and/or infiltrate is in the lung bases. No pneumothorax. Prior lumbar spine fusion.  IMPRESSION: Congestive heart failure with pulmonary edema and bilateral pleural effusions, no significant change from prior exam.   Electronically Signed   By: Maisie Fus  Register   On: 08/17/2013 07:19   Dg Chest Bilateral Decubitus  08/17/2013   CLINICAL DATA:  Pleural effusions.  EXAM: CHEST - BILATERAL DECUBITUS VIEW  COMPARISON:  DG CHEST 1V PORT dated 08/14/2013  FINDINGS: Bilateral layering pleural effusions are present. Changes of congestive heart failure with pulmonary edema noted. No pneumothorax.  IMPRESSION: 1. Layering pleural effusions. 2. Congestive heart failure pulmonary edema.    Electronically Signed   By: Maisie Fus  Register   On: 08/17/2013 07:20   US Thoracentesis Asp Pleural Space W/img Guide  08/17/2013   CLINICAL DATA:  Congestive heart failure, right pleural effusion, shortness of breath, request for thoracentesis.  EXAM: ULTRASOUND GUIDED right THORACENTESIS  COMPARISON:  None.  PROCEDURE: An ultrasound guided thoracentesis was thoroughly discussed with the patient and questions answered. The benefits, risks, alternatives and complications were also discussed. The patient understands and wishes to proceed with the procedure. Written consent was obtained.  Ultrasound was performed to localize and mark an adequate pocket of fluid in the right chest. The area was then prepped and draped in the normal sterile fashion. 1% Lidocaine was used for local anesthesia. Under ultrasound guidance a 19 gauge Yueh catheter was introduced. Thoracentesis was performed. The catheter was removed and a dressing applied.  Complications:  None.  FINDINGS: A total of approximately 1.2 liters of yellow fluid was removed. A fluid sample was not sent for laboratory analysis.  IMPRESSION: Successful ultrasound guided right thoracentesis yielding 1.2 liters of pleural fluid.  Read By:  Pattricia Boss PA-C   Electronically Signed   By: Richarda Overlie M.D.   On: 08/17/2013 15:31    Labs: Lab Results  Component Value Date   WBC 9.6 08/18/2013   HGB 8.6* 08/18/2013   HCT 27.9* 08/18/2013   MCV 77.9* 08/18/2013   PLT 232 08/18/2013     Recent Labs Lab 08/14/13 1431  08/18/13 0520  NA 144  < > 141  K 4.0  < > 4.5  CL 104  < > 104  CO2 24  < > 24  BUN 46*  < > 55*  CREATININE 1.82*  < > 1.89*  CALCIUM 8.8  < > 8.3*  PROT 6.5  --   --   BILITOT 0.3  --   --   ALKPHOS 71  --   --   ALT 29  --   --   AST 27  --   --   GLUCOSE 176*  < > 96  < > = values in this interval not displayed.     Lab Results  Component Value Date   INR 1.14 08/18/2013   INR 0.98 08/17/2013   INR 1.15 08/16/2013      Recent Results (from the past 240 hour(s))  MRSA PCR SCREENING     Status: None   Collection Time    08/15/13  7:52 AM      Result Value Ref Range Status   MRSA by PCR NEGATIVE  NEGATIVE Final   Comment:            The GeneXpert MRSA Assay (FDA     approved for NASAL specimens     only), is one component of a     comprehensive MRSA colonization     surveillance program. It is not     intended to diagnose MRSA     infection nor to guide or     monitor treatment for     MRSA infections.    May 08, 2013 Transthoracic Echo results: poor acoustic windows, Overall LV ejection fraction appeared normal, with normal LV cavity size, Severe LVH, mild AI, mild MR, LAE, RV not well seen  EKG results: atrial fibrillation with PVCs, right BBB, LVH pattern  Discharge Exam: Blood pressure 130/86, pulse 68, temperature 98.2 F (36.8 C), temperature source Oral, resp. rate 20, height 5\' 2"  (1.575 m), weight 81.058 kg (178 lb 11.2 oz), SpO2 100.00%.  Physical Exam: In general, she is a mildly overweight white woman who was in no apparent distress while sitting upright on nasal cannula oxygen having breakfast. HEENT exam was within normal limits, Neck was supple and without JVD or bruit, chest had minimal right base crackles and decreased breath sounds in the left lung base. Heart had a regular rate and rhythm without significant murmur. Abdomen was benign, Extremities had bilateral trace leg edema. She was alert and well oriented and could answer questions reasonably well with only mild dysarthria.   Disposition: She will be discharged from the hospital to return to the SNF for continued treatment of physical deconditioning and acute on chronic CHF from diastolic dysfunction. At the SNF we will gradually taper down her prednisone dose.   Discharge Orders   Future Orders Complete By Expires   Call MD for:  As directed    Comments:     Call for fever, chills, worsening difficulty breathing,  chest pain, or other concerning symptoms.   Diet - low sodium heart healthy  As directed    Discharge instructions  As directed    Comments:     We will need to check body weights every Monday, Wednesday, and Friday and have results sent to Dr. Eloise Harman every Friday. Will need to check a basic metabolic panel lab every Thursday, and can use foot vein for blood draw if necessary. Will need to check an INR test this Friday (08/21/13). Will need to be on nasal cannula oxygen at 2 liters per minute at all times.   Increase activity slowly  As directed        Medication List    STOP taking these medications       propranolol 80 MG tablet  Commonly known as:  INDERAL      TAKE these medications       acetaminophen 325 MG tablet  Commonly known as:  TYLENOL  Take 650 mg by mouth every 4 (four) hours as needed (for pain).     albuterol (2.5 MG/3ML) 0.083% nebulizer solution  Commonly known as:  PROVENTIL  Take 2.5 mg by nebulization every 6 (six) hours as needed for wheezing or shortness of breath.     albuterol (2.5 MG/3ML) 0.083% nebulizer solution  Commonly known as:  PROVENTIL  Take 3 mLs (2.5 mg total) by nebulization 2 (two) times daily.     ASPIRIN ADULT LOW STRENGTH 81 MG chewable tablet  Generic drug:  aspirin  Chew 81 mg by mouth daily.     atorvastatin 20 MG tablet  Commonly known as:  LIPITOR  Take 20 mg by mouth at bedtime.     clonazePAM 1 MG tablet  Commonly known as:  KLONOPIN  Take 1 mg by mouth at bedtime.     clonazePAM 0.5 MG tablet  Commonly known as:  KLONOPIN  Take 1 tablet (0.5 mg total) by mouth every morning.     docusate sodium 100 MG capsule  Commonly known as:  COLACE  Take 100 mg by mouth 2 (two) times daily.     famotidine 20 MG tablet  Commonly known as:  PEPCID  Take 20 mg by mouth 2 (two) times daily.     furosemide 80 MG  tablet  Commonly known as:  LASIX  Take 1 tablet (80 mg total) by mouth daily.     HYDROcodone-acetaminophen  5-325 MG per tablet  Commonly known as:  NORCO/VICODIN  Take 1 tablet by mouth 3 (three) times daily as needed for moderate pain.     insulin aspart 100 UNIT/ML injection  Commonly known as:  novoLOG  Inject 0-9 Units into the skin 3 (three) times daily before meals. <120= no insulin, 121-150= 1 unit, 151-200=2 units, 201-250= 3 units 251-300= 5 units, 301-350= 7 units, 351-400= 9 units, >400= call md     insulin detemir 100 UNIT/ML injection  Commonly known as:  LEVEMIR  Inject 7 Units into the skin 2 (two) times daily.     metoprolol tartrate 25 MG tablet  Commonly known as:  LOPRESSOR  Take 0.5 tablets (12.5 mg total) by mouth 2 (two) times daily.     multivitamins ther. w/minerals Tabs tablet  Take 1 tablet by mouth daily.     nitroGLYCERIN 0.2 mg/hr patch  Commonly known as:  NITRODUR - Dosed in mg/24 hr  Place 0.2 mg onto the skin daily.     predniSONE 10 MG tablet  Commonly known as:  DELTASONE  Take 3 tablets (30 mg total) by mouth daily with breakfast.     QUEtiapine 100 MG tablet  Commonly known as:  SEROQUEL  Take 100 mg by mouth at bedtime.     senna-docusate 8.6-50 MG per tablet  Commonly known as:  Senokot-S  Take 1 tablet by mouth at bedtime. May take 1 additional tablet daily as needed for constipation     spironolactone 25 MG tablet  Commonly known as:  ALDACTONE  Take 1 tablet (25 mg total) by mouth daily.     tiotropium 18 MCG inhalation capsule  Commonly known as:  SPIRIVA  Place 1 capsule (18 mcg total) into inhaler and inhale daily.     traZODone 50 MG tablet  Commonly known as:  DESYREL  Take 50 mg by mouth at bedtime.     venlafaxine XR 75 MG 24 hr capsule  Commonly known as:  EFFEXOR-XR  Take 75 mg by mouth daily.     warfarin 2 MG tablet  Commonly known as:  COUMADIN  Take 1 tablet (2 mg total) by mouth daily.         Signed: Garlan FillersPATERSON,Euretha Najarro G 08/18/2013, 8:38 AM

## 2013-08-18 NOTE — Progress Notes (Signed)
UR completed Ariana Dadisman K. Zurich Carreno, RN, BSN, MSHL, CCM  08/18/2013 2:46 PM

## 2013-08-18 NOTE — Care Management Note (Addendum)
  Page 1 of 1   08/19/2013     10:39:56 AM   CARE MANAGEMENT NOTE 08/19/2013  Patient:  Ariana [name],Ariana [name]   Account Number:  [id number]  Date Initiated:  08/18/2013  Documentation initiated by:  Donato SchultzHUTCHINSON,Tejuan Gholson  Subjective/Objective Assessment:   Admitted with leural effusion     Action/Plan:   CM to follow for disposition needs   Anticipated DC Date:  08/18/2013   Anticipated DC Plan:  HOME/SELF CARE         Choice offered to / List presented to:             Status of service:  Completed, signed off Medicare Important Message given?   (If response is "NO", the following Medicare IM given date fields will be blank) Date Medicare IM given:   Date Additional Medicare IM given:    Discharge Disposition:  SKILLED NURSING FACILITY  Per UR Regulation:  Reviewed for med. necessity/level of care/duration of stay  If discussed at Long Length of Stay Meetings, dates discussed:    Comments:  Disposition:  D/c to SNF/[facility] on 08/18/2013 Kenyette Gundy RN, BSN, MSHL, CCM 08/18/2013

## 2013-08-18 NOTE — Progress Notes (Signed)
OK per MD for d/c today back to [facility] via EMS.  Ok per [place]- Admissions Director of [facility].  CSW left messages for patient's son on both his cell phone and work number regarding d/c with request to call back.  CSW has not yet heard back from son.  DC plan completed; nursing notified to call report to facility.  CSW signing off.  Lorri Frederickonna T. West PughCrowder, LCSWA  [phone]

## 2013-08-18 NOTE — Clinical Social Work Psychosocial (Signed)
Clinical Social Work Department BRIEF PSYCHOSOCIAL ASSESSMENT 08/18/2013  Patient:  Ariana [name],Ariana [name]     Account Number:  [id number]     Admit date:  08/14/2013  Clinical Social Worker:  Donnella Sham, CLINICAL SOCIAL WORKER  Date/Time:  08/18/2013 10:56 AM  Referred by:  Physician  Date Referred:  08/18/2013 Referred for  Psychosocial assessment   Other Referral:   Interview type:  Patient Other interview type:    PSYCHOSOCIAL DATA Living Status:  FACILITY Admitted from facility:  [facility] Level of care:  [place] Primary support name:  Pricilla Larsson ([phone]) Bunnie Pion (Daughter-in-law) Primary support relationship to patient:  CHILD, ADULT Degree of support available:   Corena Pilgrim [phone])    CURRENT CONCERNS Current Concerns  Post-Acute Placement   Other Concerns:    SOCIAL WORK ASSESSMENT / PLAN student social worker met with pt at bedside. pt appeared to be alert and oriented, but seemed confused.  pt confirmed that she is from [place]. Student social worker asked pt what level of care she was receiving and pt wasn't sure. Facility contacted, pt is at skilled level of care. pt confirmed that at d/c she would like to return. Facility is also willing to take pt back at d/c. pt stated she is 02 dependent and uses it at the facility. Pt stated that her son, Zen Marcello Moores [phone]) is her primary support. She gave permission for student social worker to call. Student social worker contcted daughter-in-law and she stated that the plan for her mother-in-law is to go back to [facility]. fl2 is on the chart for MDs signature   Assessment/plan status:  Psychosocial Support/Ongoing Assessment of Needs Other assessment/ plan:   Information/referral to community resources:    PATIENT'S/FAMILY'S RESPONSE TO PLAN OF CARE: Public house manager met with pt at bedside. pt appeared to be alert and  oriented, but confused at times. pt confirmed she would like to go back to [facility] at d/c. Facility is also willing to accept pt back.    Donnella Sham, [place] Intern [phone]

## 2013-08-18 NOTE — Care Management Note (Deleted)
    Page 1 of 1   08/18/2013     2:43:43 PM   CARE MANAGEMENT NOTE 08/18/2013  Patient:  Ariana [name],Ariana [name]   Account Number:  [id number]  Date Initiated:  08/18/2013  Documentation initiated by:  Donato SchultzHUTCHINSON,CRYSTAL  Subjective/Objective Assessment:   Admitted with leural effusion     Action/Plan:   CM to follow for disposition needs   Anticipated DC Date:  08/18/2013   Anticipated DC Plan:  HOME/SELF CARE         Choice offered to / List presented to:             Status of service:  Completed, signed off Medicare Important Message given?   (If response is "NO", the following Medicare IM given date fields will be blank) Date Medicare IM given:   Date Additional Medicare IM given:    Discharge Disposition:  HOME/SELF CARE  Per UR Regulation:  Reviewed for med. necessity/level of care/duration of stay  If discussed at Long Length of Stay Meetings, dates discussed:    Comments:

## 2013-08-24 ENCOUNTER — Emergency Department (HOSPITAL_COMMUNITY): Payer: Medicare Other

## 2013-08-24 ENCOUNTER — Inpatient Hospital Stay (HOSPITAL_COMMUNITY)
Admission: EM | Admit: 2013-08-24 | Discharge: 2013-09-25 | DRG: 871 | Disposition: E | Payer: Medicare Other | Source: Skilled Nursing Facility | Attending: Internal Medicine | Admitting: Internal Medicine

## 2013-08-24 ENCOUNTER — Encounter (HOSPITAL_COMMUNITY): Payer: Self-pay | Admitting: Emergency Medicine

## 2013-08-24 DIAGNOSIS — Z515 Encounter for palliative care: Secondary | ICD-10-CM

## 2013-08-24 DIAGNOSIS — I5031 Acute diastolic (congestive) heart failure: Secondary | ICD-10-CM

## 2013-08-24 DIAGNOSIS — J9601 Acute respiratory failure with hypoxia: Secondary | ICD-10-CM

## 2013-08-24 DIAGNOSIS — IMO0002 Reserved for concepts with insufficient information to code with codable children: Secondary | ICD-10-CM

## 2013-08-24 DIAGNOSIS — I480 Paroxysmal atrial fibrillation: Secondary | ICD-10-CM | POA: Diagnosis present

## 2013-08-24 DIAGNOSIS — T380X5A Adverse effect of glucocorticoids and synthetic analogues, initial encounter: Secondary | ICD-10-CM | POA: Diagnosis not present

## 2013-08-24 DIAGNOSIS — I4891 Unspecified atrial fibrillation: Secondary | ICD-10-CM | POA: Diagnosis present

## 2013-08-24 DIAGNOSIS — I2510 Atherosclerotic heart disease of native coronary artery without angina pectoris: Secondary | ICD-10-CM | POA: Diagnosis present

## 2013-08-24 DIAGNOSIS — E875 Hyperkalemia: Secondary | ICD-10-CM | POA: Diagnosis not present

## 2013-08-24 DIAGNOSIS — D649 Anemia, unspecified: Secondary | ICD-10-CM

## 2013-08-24 DIAGNOSIS — J441 Chronic obstructive pulmonary disease with (acute) exacerbation: Secondary | ICD-10-CM | POA: Diagnosis present

## 2013-08-24 DIAGNOSIS — N183 Chronic kidney disease, stage 3 unspecified: Secondary | ICD-10-CM

## 2013-08-24 DIAGNOSIS — Z66 Do not resuscitate: Secondary | ICD-10-CM | POA: Diagnosis present

## 2013-08-24 DIAGNOSIS — E119 Type 2 diabetes mellitus without complications: Secondary | ICD-10-CM

## 2013-08-24 DIAGNOSIS — E872 Acidosis, unspecified: Secondary | ICD-10-CM | POA: Diagnosis not present

## 2013-08-24 DIAGNOSIS — G9340 Encephalopathy, unspecified: Secondary | ICD-10-CM | POA: Diagnosis not present

## 2013-08-24 DIAGNOSIS — J9600 Acute respiratory failure, unspecified whether with hypoxia or hypercapnia: Secondary | ICD-10-CM | POA: Diagnosis present

## 2013-08-24 DIAGNOSIS — N289 Disorder of kidney and ureter, unspecified: Secondary | ICD-10-CM | POA: Diagnosis present

## 2013-08-24 DIAGNOSIS — D638 Anemia in other chronic diseases classified elsewhere: Secondary | ICD-10-CM | POA: Diagnosis present

## 2013-08-24 DIAGNOSIS — Z7982 Long term (current) use of aspirin: Secondary | ICD-10-CM

## 2013-08-24 DIAGNOSIS — A419 Sepsis, unspecified organism: Principal | ICD-10-CM

## 2013-08-24 DIAGNOSIS — I5033 Acute on chronic diastolic (congestive) heart failure: Secondary | ICD-10-CM | POA: Diagnosis present

## 2013-08-24 DIAGNOSIS — Z79899 Other long term (current) drug therapy: Secondary | ICD-10-CM

## 2013-08-24 DIAGNOSIS — K219 Gastro-esophageal reflux disease without esophagitis: Secondary | ICD-10-CM | POA: Diagnosis present

## 2013-08-24 DIAGNOSIS — E8809 Other disorders of plasma-protein metabolism, not elsewhere classified: Secondary | ICD-10-CM | POA: Diagnosis present

## 2013-08-24 DIAGNOSIS — F0390 Unspecified dementia without behavioral disturbance: Secondary | ICD-10-CM

## 2013-08-24 DIAGNOSIS — E2749 Other adrenocortical insufficiency: Secondary | ICD-10-CM | POA: Diagnosis present

## 2013-08-24 DIAGNOSIS — Z7901 Long term (current) use of anticoagulants: Secondary | ICD-10-CM

## 2013-08-24 DIAGNOSIS — R6520 Severe sepsis without septic shock: Secondary | ICD-10-CM

## 2013-08-24 DIAGNOSIS — I69922 Dysarthria following unspecified cerebrovascular disease: Secondary | ICD-10-CM

## 2013-08-24 DIAGNOSIS — J9611 Chronic respiratory failure with hypoxia: Secondary | ICD-10-CM

## 2013-08-24 DIAGNOSIS — J189 Pneumonia, unspecified organism: Secondary | ICD-10-CM

## 2013-08-24 DIAGNOSIS — I509 Heart failure, unspecified: Secondary | ICD-10-CM | POA: Diagnosis present

## 2013-08-24 DIAGNOSIS — I9589 Other hypotension: Secondary | ICD-10-CM | POA: Diagnosis not present

## 2013-08-24 DIAGNOSIS — Z794 Long term (current) use of insulin: Secondary | ICD-10-CM

## 2013-08-24 DIAGNOSIS — J90 Pleural effusion, not elsewhere classified: Secondary | ICD-10-CM

## 2013-08-24 DIAGNOSIS — I69959 Hemiplegia and hemiparesis following unspecified cerebrovascular disease affecting unspecified side: Secondary | ICD-10-CM

## 2013-08-24 DIAGNOSIS — F411 Generalized anxiety disorder: Secondary | ICD-10-CM | POA: Diagnosis not present

## 2013-08-24 LAB — I-STAT ARTERIAL BLOOD GAS, ED
BICARBONATE: 24.4 meq/L — AB (ref 20.0–24.0)
O2 SAT: 97 %
PO2 ART: 91 mmHg (ref 80.0–100.0)
TCO2: 25 mmol/L (ref 0–100)
pCO2 arterial: 36.3 mmHg (ref 35.0–45.0)
pH, Arterial: 7.436 (ref 7.350–7.450)

## 2013-08-24 LAB — I-STAT CHEM 8, ED
BUN: 43 mg/dL — AB (ref 6–23)
CALCIUM ION: 1.15 mmol/L (ref 1.13–1.30)
CREATININE: 2.2 mg/dL — AB (ref 0.50–1.10)
Chloride: 106 mEq/L (ref 96–112)
GLUCOSE: 133 mg/dL — AB (ref 70–99)
HCT: 29 % — ABNORMAL LOW (ref 36.0–46.0)
Hemoglobin: 9.9 g/dL — ABNORMAL LOW (ref 12.0–15.0)
Potassium: 3.7 mEq/L (ref 3.7–5.3)
Sodium: 142 mEq/L (ref 137–147)
TCO2: 26 mmol/L (ref 0–100)

## 2013-08-24 LAB — PROTIME-INR
INR: 2.14 — ABNORMAL HIGH (ref 0.00–1.49)
PROTHROMBIN TIME: 23.2 s — AB (ref 11.6–15.2)

## 2013-08-24 LAB — CBC WITH DIFFERENTIAL/PLATELET
Basophils Absolute: 0 10*3/uL (ref 0.0–0.1)
Basophils Relative: 0 % (ref 0–1)
Eosinophils Absolute: 0 10*3/uL (ref 0.0–0.7)
Eosinophils Relative: 0 % (ref 0–5)
HEMATOCRIT: 29.5 % — AB (ref 36.0–46.0)
HEMOGLOBIN: 8.9 g/dL — AB (ref 12.0–15.0)
LYMPHS ABS: 0.5 10*3/uL — AB (ref 0.7–4.0)
LYMPHS PCT: 4 % — AB (ref 12–46)
MCH: 22.8 pg — ABNORMAL LOW (ref 26.0–34.0)
MCHC: 30.2 g/dL (ref 30.0–36.0)
MCV: 75.6 fL — ABNORMAL LOW (ref 78.0–100.0)
MONOS PCT: 7 % (ref 3–12)
Monocytes Absolute: 1 10*3/uL (ref 0.1–1.0)
NEUTROS ABS: 11.7 10*3/uL — AB (ref 1.7–7.7)
Neutrophils Relative %: 88 % — ABNORMAL HIGH (ref 43–77)
Platelets: ADEQUATE 10*3/uL (ref 150–400)
RBC: 3.9 MIL/uL (ref 3.87–5.11)
RDW: 17.3 % — ABNORMAL HIGH (ref 11.5–15.5)
WBC: 13.3 10*3/uL — AB (ref 4.0–10.5)

## 2013-08-24 LAB — COMPREHENSIVE METABOLIC PANEL
ALT: 24 U/L (ref 0–35)
AST: 27 U/L (ref 0–37)
Albumin: 2.2 g/dL — ABNORMAL LOW (ref 3.5–5.2)
Alkaline Phosphatase: 61 U/L (ref 39–117)
BUN: 45 mg/dL — AB (ref 6–23)
CHLORIDE: 103 meq/L (ref 96–112)
CO2: 24 meq/L (ref 19–32)
CREATININE: 1.84 mg/dL — AB (ref 0.50–1.10)
Calcium: 8.7 mg/dL (ref 8.4–10.5)
GFR, EST AFRICAN AMERICAN: 31 mL/min — AB (ref >90)
GFR, EST NON AFRICAN AMERICAN: 26 mL/min — AB (ref >90)
GLUCOSE: 130 mg/dL — AB (ref 70–99)
Potassium: 3.9 mEq/L (ref 3.7–5.3)
Sodium: 143 mEq/L (ref 137–147)
Total Protein: 5.8 g/dL — ABNORMAL LOW (ref 6.0–8.3)

## 2013-08-24 LAB — I-STAT CG4 LACTIC ACID, ED: Lactic Acid, Venous: 1.68 mmol/L (ref 0.5–2.2)

## 2013-08-24 LAB — PRO B NATRIURETIC PEPTIDE: PRO B NATRI PEPTIDE: 37282 pg/mL — AB (ref 0–125)

## 2013-08-24 MED ORDER — ACETAMINOPHEN 325 MG PO TABS
650.0000 mg | ORAL_TABLET | Freq: Four times a day (QID) | ORAL | Status: DC | PRN
  Administered 2013-08-24: 650 mg via ORAL
  Filled 2013-08-24: qty 2

## 2013-08-24 MED ORDER — INSULIN ASPART 100 UNIT/ML ~~LOC~~ SOLN: 0.0000 [IU] | Freq: Three times a day (TID) | SUBCUTANEOUS | Status: DC

## 2013-08-24 MED ORDER — FAMOTIDINE 20 MG PO TABS
20.0000 mg | ORAL_TABLET | Freq: Two times a day (BID) | ORAL | Status: DC
  Administered 2013-08-24 – 2013-08-26 (×4): 20 mg via ORAL
  Filled 2013-08-24 (×6): qty 1

## 2013-08-24 MED ORDER — ONDANSETRON HCL 4 MG/2ML IJ SOLN
4.0000 mg | Freq: Four times a day (QID) | INTRAMUSCULAR | Status: DC | PRN
  Administered 2013-08-26: 4 mg via INTRAVENOUS
  Filled 2013-08-24: qty 2

## 2013-08-24 MED ORDER — METHYLPREDNISOLONE SODIUM SUCC 125 MG IJ SOLR
80.0000 mg | Freq: Four times a day (QID) | INTRAMUSCULAR | Status: DC
  Administered 2013-08-24 – 2013-08-25 (×2): 80 mg via INTRAVENOUS
  Filled 2013-08-24 (×6): qty 1.28

## 2013-08-24 MED ORDER — ALBUTEROL SULFATE (2.5 MG/3ML) 0.083% IN NEBU
5.0000 mg | INHALATION_SOLUTION | Freq: Once | RESPIRATORY_TRACT | Status: AC
  Administered 2013-08-24: 5 mg via RESPIRATORY_TRACT
  Filled 2013-08-24: qty 6

## 2013-08-24 MED ORDER — METOPROLOL SUCCINATE ER 25 MG PO TB24
25.0000 mg | ORAL_TABLET | Freq: Two times a day (BID) | ORAL | Status: DC
  Administered 2013-08-24: 25 mg via ORAL
  Filled 2013-08-24 (×3): qty 1

## 2013-08-24 MED ORDER — ALBUTEROL SULFATE (2.5 MG/3ML) 0.083% IN NEBU
2.5000 mg | INHALATION_SOLUTION | Freq: Two times a day (BID) | RESPIRATORY_TRACT | Status: DC
  Administered 2013-08-24 – 2013-08-28 (×9): 2.5 mg via RESPIRATORY_TRACT
  Filled 2013-08-24 (×9): qty 3

## 2013-08-24 MED ORDER — WARFARIN - PHYSICIAN DOSING INPATIENT: Freq: Every day | Status: DC

## 2013-08-24 MED ORDER — VENLAFAXINE HCL ER 75 MG PO CP24
75.0000 mg | ORAL_CAPSULE | Freq: Every day | ORAL | Status: DC
  Administered 2013-08-24 – 2013-08-27 (×4): 75 mg via ORAL
  Filled 2013-08-24 (×6): qty 1

## 2013-08-24 MED ORDER — DOCUSATE SODIUM 100 MG PO CAPS
100.0000 mg | ORAL_CAPSULE | Freq: Two times a day (BID) | ORAL | Status: DC
  Administered 2013-08-24 – 2013-08-27 (×4): 100 mg via ORAL
  Filled 2013-08-24 (×4): qty 1

## 2013-08-24 MED ORDER — SODIUM CHLORIDE 0.9 % IV BOLUS (SEPSIS)
500.0000 mL | Freq: Once | INTRAVENOUS | Status: AC
  Administered 2013-08-24: 500 mL via INTRAVENOUS

## 2013-08-24 MED ORDER — ACETAMINOPHEN 650 MG RE SUPP: 650.0000 mg | Freq: Four times a day (QID) | RECTAL | Status: DC | PRN

## 2013-08-24 MED ORDER — POTASSIUM CHLORIDE IN NACL 20-0.9 MEQ/L-% IV SOLN
INTRAVENOUS | Status: DC
  Administered 2013-08-24: 10 mL/h via INTRAVENOUS
  Filled 2013-08-24 (×2): qty 1000

## 2013-08-24 MED ORDER — DEXTROSE 5 % IV SOLN
1.0000 g | INTRAVENOUS | Status: DC
  Administered 2013-08-25: 1 g via INTRAVENOUS
  Filled 2013-08-24 (×2): qty 1

## 2013-08-24 MED ORDER — IPRATROPIUM BROMIDE 0.02 % IN SOLN
0.5000 mg | Freq: Once | RESPIRATORY_TRACT | Status: AC
  Administered 2013-08-24: 0.5 mg via RESPIRATORY_TRACT
  Filled 2013-08-24: qty 2.5

## 2013-08-24 MED ORDER — WARFARIN SODIUM 4 MG PO TABS
4.0000 mg | ORAL_TABLET | Freq: Every day | ORAL | Status: DC
  Administered 2013-08-24 – 2013-08-25 (×2): 4 mg via ORAL
  Filled 2013-08-24 (×2): qty 1

## 2013-08-24 MED ORDER — HYDROCODONE-ACETAMINOPHEN 5-325 MG PO TABS
1.0000 | ORAL_TABLET | Freq: Three times a day (TID) | ORAL | Status: DC | PRN
  Administered 2013-08-25 – 2013-08-26 (×3): 1 via ORAL
  Filled 2013-08-24 (×3): qty 1

## 2013-08-24 MED ORDER — TRAZODONE HCL 50 MG PO TABS
50.0000 mg | ORAL_TABLET | Freq: Every day | ORAL | Status: DC
  Administered 2013-08-24: 50 mg via ORAL
  Filled 2013-08-24 (×2): qty 1

## 2013-08-24 MED ORDER — CLONAZEPAM 0.5 MG PO TABS: 0.5000 mg | ORAL_TABLET | Freq: Every morning | ORAL | Status: DC

## 2013-08-24 MED ORDER — NITROGLYCERIN 0.2 MG/HR TD PT24
0.2000 mg | MEDICATED_PATCH | Freq: Every day | TRANSDERMAL | Status: DC
  Filled 2013-08-24 (×4): qty 1

## 2013-08-24 MED ORDER — ALUM & MAG HYDROXIDE-SIMETH 200-200-20 MG/5ML PO SUSP: 30.0000 mL | Freq: Four times a day (QID) | ORAL | Status: DC | PRN

## 2013-08-24 MED ORDER — ADULT MULTIVITAMIN W/MINERALS CH
1.0000 | ORAL_TABLET | Freq: Every day | ORAL | Status: DC
  Administered 2013-08-24 – 2013-08-27 (×4): 1 via ORAL
  Filled 2013-08-24 (×5): qty 1

## 2013-08-24 MED ORDER — QUETIAPINE FUMARATE 100 MG PO TABS
100.0000 mg | ORAL_TABLET | Freq: Every day | ORAL | Status: DC
  Administered 2013-08-24: 100 mg via ORAL
  Filled 2013-08-24 (×2): qty 1

## 2013-08-24 MED ORDER — VANCOMYCIN HCL IN DEXTROSE 750-5 MG/150ML-% IV SOLN
750.0000 mg | INTRAVENOUS | Status: DC
  Administered 2013-08-25: 750 mg via INTRAVENOUS
  Filled 2013-08-24 (×2): qty 150

## 2013-08-24 MED ORDER — SPIRONOLACTONE 25 MG PO TABS
25.0000 mg | ORAL_TABLET | Freq: Every day | ORAL | Status: DC
  Administered 2013-08-24: 25 mg via ORAL
  Filled 2013-08-24 (×2): qty 1

## 2013-08-24 MED ORDER — SPIRONOLACTONE 25 MG PO TABS
25.0000 mg | ORAL_TABLET | Freq: Every day | ORAL | Status: DC
  Filled 2013-08-24: qty 1

## 2013-08-24 MED ORDER — INSULIN DETEMIR 100 UNIT/ML ~~LOC~~ SOLN
7.0000 [IU] | Freq: Two times a day (BID) | SUBCUTANEOUS | Status: DC
  Administered 2013-08-24 – 2013-08-27 (×6): 7 [IU] via SUBCUTANEOUS
  Filled 2013-08-24 (×7): qty 0.07

## 2013-08-24 MED ORDER — ATORVASTATIN CALCIUM 20 MG PO TABS
20.0000 mg | ORAL_TABLET | Freq: Every day | ORAL | Status: DC
  Administered 2013-08-24 – 2013-08-26 (×3): 20 mg via ORAL
  Filled 2013-08-24 (×4): qty 1

## 2013-08-24 MED ORDER — DEXTROSE 5 % IV SOLN
1.0000 g | Freq: Once | INTRAVENOUS | Status: AC
  Administered 2013-08-24: 1 g via INTRAVENOUS
  Filled 2013-08-24: qty 1

## 2013-08-24 MED ORDER — VANCOMYCIN HCL IN DEXTROSE 1-5 GM/200ML-% IV SOLN
1000.0000 mg | INTRAVENOUS | Status: AC
  Administered 2013-08-24: 1000 mg via INTRAVENOUS
  Filled 2013-08-24: qty 200

## 2013-08-24 MED ORDER — ASPIRIN 81 MG PO CHEW
81.0000 mg | CHEWABLE_TABLET | Freq: Every day | ORAL | Status: DC
  Administered 2013-08-24 – 2013-08-27 (×4): 81 mg via ORAL
  Filled 2013-08-24 (×4): qty 1

## 2013-08-24 MED ORDER — OXYCODONE HCL 5 MG PO TABS: 5.0000 mg | ORAL_TABLET | ORAL | Status: DC | PRN

## 2013-08-24 MED ORDER — POLYSACCHARIDE IRON COMPLEX 150 MG PO CAPS
150.0000 mg | ORAL_CAPSULE | Freq: Every day | ORAL | Status: DC
  Administered 2013-08-24 – 2013-08-27 (×4): 150 mg via ORAL
  Filled 2013-08-24 (×4): qty 1

## 2013-08-24 MED ORDER — CLONAZEPAM 1 MG PO TABS
1.0000 mg | ORAL_TABLET | Freq: Two times a day (BID) | ORAL | Status: DC
  Filled 2013-08-24: qty 1

## 2013-08-24 MED ORDER — SODIUM CHLORIDE 0.9 % IJ SOLN
3.0000 mL | Freq: Two times a day (BID) | INTRAMUSCULAR | Status: DC
  Administered 2013-08-24: 23:00:00 via INTRAVENOUS
  Administered 2013-08-25 – 2013-08-26 (×2): 3 mL via INTRAVENOUS
  Administered 2013-08-26: 10 mL via INTRAVENOUS

## 2013-08-24 MED ORDER — ALBUTEROL SULFATE (2.5 MG/3ML) 0.083% IN NEBU
2.5000 mg | INHALATION_SOLUTION | Freq: Four times a day (QID) | RESPIRATORY_TRACT | Status: DC | PRN
  Administered 2013-08-24 – 2013-08-26 (×2): 2.5 mg via RESPIRATORY_TRACT
  Filled 2013-08-24 (×2): qty 3

## 2013-08-24 MED ORDER — ONDANSETRON HCL 4 MG PO TABS: 4.0000 mg | ORAL_TABLET | Freq: Four times a day (QID) | ORAL | Status: DC | PRN

## 2013-08-24 MED ORDER — SENNOSIDES-DOCUSATE SODIUM 8.6-50 MG PO TABS
1.0000 | ORAL_TABLET | Freq: Every day | ORAL | Status: DC
  Administered 2013-08-24 – 2013-08-26 (×3): 1 via ORAL
  Filled 2013-08-24 (×4): qty 1

## 2013-08-24 MED ORDER — METHYLPREDNISOLONE SODIUM SUCC 125 MG IJ SOLR
125.0000 mg | Freq: Once | INTRAMUSCULAR | Status: AC
  Administered 2013-08-24: 125 mg via INTRAVENOUS
  Filled 2013-08-24: qty 2

## 2013-08-24 MED ORDER — POTASSIUM CHLORIDE IN NACL 20-0.9 MEQ/L-% IV SOLN
INTRAVENOUS | Status: DC
  Filled 2013-08-24: qty 1000

## 2013-08-24 MED ORDER — TIOTROPIUM BROMIDE MONOHYDRATE 18 MCG IN CAPS
18.0000 ug | ORAL_CAPSULE | Freq: Every day | RESPIRATORY_TRACT | Status: DC
  Administered 2013-08-25 – 2013-08-27 (×3): 18 ug via RESPIRATORY_TRACT
  Filled 2013-08-24: qty 5

## 2013-08-24 MED ORDER — ALBUTEROL SULFATE (2.5 MG/3ML) 0.083% IN NEBU
INHALATION_SOLUTION | RESPIRATORY_TRACT | Status: AC
  Filled 2013-08-24: qty 3

## 2013-08-24 MED ORDER — FUROSEMIDE 10 MG/ML IJ SOLN
40.0000 mg | Freq: Two times a day (BID) | INTRAMUSCULAR | Status: DC
  Filled 2013-08-24 (×2): qty 4

## 2013-08-24 MED ORDER — ACETAMINOPHEN 325 MG PO TABS: 650.0000 mg | ORAL_TABLET | ORAL | Status: DC | PRN

## 2013-08-24 NOTE — ED Notes (Signed)
Provider view chest xray results at bedside.

## 2013-08-24 NOTE — Progress Notes (Signed)
ANTIBIOTIC CONSULT NOTE - INITIAL  Pharmacy Consult for Vancomycin and Cefepime x 8 days Indication: HCAP  Allergies  Allergen Reactions  . Baclofen Other (See Comments)    Reaction unknown  . Clindamycin/Lincomycin Other (See Comments)    Reaction unknown    Patient Measurements:    Ht: 62 in  Wt: 81 kg  Vital Signs: Temp: 98.7 F (37.1 C) (03/30 0914) Temp src: Oral (03/30 0914) BP: 71/46 mmHg (03/30 0914) Pulse Rate: 91 (03/30 0914) Intake/Output from previous day:   Intake/Output from this shift:    Labs:  Recent Labs  07/31/2013 1001  HGB 9.9*  CREATININE 2.20*   The CrCl is unknown because both a height and weight (above a minimum accepted value) are required for this calculation. No results found for this basename: VANCOTROUGH, Leodis BinetVANCOPEAK, VANCORANDOM, GENTTROUGH, GENTPEAK, GENTRANDOM, TOBRATROUGH, TOBRAPEAK, TOBRARND, AMIKACINPEAK, AMIKACINTROU, AMIKACIN,  in the last 72 hours   Microbiology: Recent Results (from the past 720 hour(s))  MRSA PCR SCREENING     Status: None   Collection Time    08/15/13  7:52 AM      Result Value Ref Range Status   MRSA by PCR NEGATIVE  NEGATIVE Final   Comment:            The GeneXpert MRSA Assay (FDA     approved for NASAL specimens     only), is one component of a     comprehensive MRSA colonization     surveillance program. It is not     intended to diagnose MRSA     infection nor to guide or     monitor treatment for     MRSA infections.    Medical History: Past Medical History  Diagnosis Date  . Pneumonia   . Pleural effusion   . CHF (congestive heart failure)   . Paroxysmal atrial fibrillation   . COPD (chronic obstructive pulmonary disease)   . Stroke   . Dysarthria due to cerebrovascular accident   . 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 Coronary artery disease   . Diabetes mellitus without complication   . GERD (gastroesophageal reflux disease)   . Dementia      Medications:  See electronic med rec  Assessment: 73 y.o. female presents from [facility] with SOB, decreased O2 sats, productive cough with brown sputum. Code sepsis level 2 called. To begin broad spectrum antibiotics x 8 days for HCAP. SCr 2.2, estimated CrCl 25 ml/min. Recent hospitalization 3/20-3/24 for dyspnea due to CHF (no abx during this admit).  Goal of Therapy:  Vancomycin trough level 15-20 mcg/ml  Plan:  1. Cefepime 1gm IV q24h x 8 days. First dose STAT. 2. Vancomycin 1gm STAT then 750mg  IV q24h x 8 days total. 3. Will f/u renal function, pt's clinical condition, micro data, vanc trough prn  Christoper Fabianaron Delainey Winstanley, PharmD, BCPS Clinical pharmacist, pager [phone] 08/08/2013,10:18 AM

## 2013-08-24 NOTE — ED Notes (Signed)
Son, Ariana [name] wanted to leave contact number of [phone]

## 2013-08-24 NOTE — H&P (Signed)
Ariana [name] is an 73 y.o. female.   Chief Complaint: Having trouble breathing HPI:  The patient is a 73 year old Caucasian woman who is well-known to me as I service as her attending physician at her skilled nursing facility. She has had several hospital admissions because of acute worsening of chronic dyspnea. 2 days ago I saw her at the skilled nursing facility with increased dyspnea, so her prednisone dosing, albuterol nebulizers, and diuretic treatment was increased. Regardless of this, her dyspnea progressed and became associated with fever and chills this morning. At the skilled nursing facility her pulse oxygen saturation level was in the 80s on nasal cannula oxygen at 2 L per minute and increased to the high 80s at 4 L per minute with significant dyspnea and fever, so she was transported to the emergency room for evaluation. In the emergency room her blood pressure systolic was initially in the 60-70 range with fever, and this improved with IV fluids and pulse dose corticosteroids. She has been given broad-spectrum antibiotics by the emergency room attending physician. She feels slightly better than at the time of admission, but still has moderate dyspnea despite supplemental oxygen. She has had a mildly productive cough as well. She has not had significant substernal chest pain. She also denies having problems with coughing with eating or drinking fluids. During her last hospital admission she had a moderate right pleural effusion with therapeutic thoracentesis done. She continues to maintain that she would like a DO NOT RESUSCITATE order here. Past Medical History  Diagnosis Date  . Pneumonia   . Pleural effusion   . CHF (congestive heart failure)   . Paroxysmal atrial fibrillation   . COPD (chronic obstructive pulmonary disease)   . Stroke   . Dysarthria due to cerebrovascular accident   . 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 Coronary artery  disease   . Diabetes mellitus without complication   . GERD (gastroesophageal reflux disease)   . Dementia      (Not in a hospital admission)  ADDITIONAL HOME MEDICATIONS: No additional medications  PHYSICIANS INVOLVED IN CARE: Leanna Battles (SNF attending)  Past Surgical History  Procedure Laterality Date  . Thoracostomy    . Thoracentesis    . Breast surgery      History reviewed. No pertinent family history.   Social History:  reports that she has never smoked. She does not have any smokeless tobacco history on file. She reports that she does not drink alcohol or use illicit drugs.  Allergies:  Allergies  Allergen Reactions  . Baclofen Other (See Comments)    Reaction unknown  . Clindamycin/Lincomycin Other (See Comments)    Reaction unknown     ROS: anemia, ankle swelling, arthritis, diabetes, high blood pressure, kidney disease, shortness of breath and COPD, CHF  PHYSICAL EXAM: Blood pressure 111/83, pulse 99, temperature 100.2 F (37.9 C), temperature source Oral, resp. rate 22, SpO2 99.00%. In general, the patient is a mildly overweight elderly woman with moderate dyspnea while sitting elevated in bed on nasal cannula oxygen. HEENT exam was within normal limits, neck was supple without jugular venous distention or carotid bruit, chest had bilateral scattered wheezing and use of accessory muscles of respiration with decreased breath sounds in the right base. Heart had an irregularly irregular rhythm without significant murmur or gallop, abdomen had normal bowel sounds no tenderness, she had bilateral 1+ leg edema. She was alert and answered questions appropriately with very mild dysarthria.  Results  for orders placed during the hospital encounter of 07/26/2013 (from the past 48 hour(s))  PRO B NATRIURETIC PEPTIDE     Status: Abnormal   Collection Time    08/02/2013  9:45 AM      Result Value Ref Range   Pro B Natriuretic peptide (BNP) 37282.0 (*) 0 - 125 pg/mL   CBC WITH DIFFERENTIAL     Status: Abnormal   Collection Time    08/23/2013  9:45 AM      Result Value Ref Range   WBC 13.3 (*) 4.0 - 10.5 K/uL   RBC 3.90  3.87 - 5.11 MIL/uL   Hemoglobin 8.9 (*) 12.0 - 15.0 g/dL   HCT 29.5 (*) 36.0 - 46.0 %   MCV 75.6 (*) 78.0 - 100.0 fL   MCH 22.8 (*) 26.0 - 34.0 pg   MCHC 30.2  30.0 - 36.0 g/dL   RDW 17.3 (*) 11.5 - 15.5 %   Platelets    150 - 400 K/uL   Value: PLATELET CLUMPS NOTED ON SMEAR, COUNT APPEARS ADEQUATE   Neutrophils Relative % 88 (*) 43 - 77 %   Neutro Abs 11.7 (*) 1.7 - 7.7 K/uL   Lymphocytes Relative 4 (*) 12 - 46 %   Lymphs Abs 0.5 (*) 0.7 - 4.0 K/uL   Monocytes Relative 7  3 - 12 %   Monocytes Absolute 1.0  0.1 - 1.0 K/uL   Eosinophils Relative 0  0 - 5 %   Eosinophils Absolute 0.0  0.0 - 0.7 K/uL   Basophils Relative 0  0 - 1 %   Basophils Absolute 0.0  0.0 - 0.1 K/uL  COMPREHENSIVE METABOLIC PANEL     Status: Abnormal   Collection Time    08/10/2013  9:45 AM      Result Value Ref Range   Sodium 143  137 - 147 mEq/L   Potassium 3.9  3.7 - 5.3 mEq/L   Chloride 103  96 - 112 mEq/L   CO2 24  19 - 32 mEq/L   Glucose, Bld 130 (*) 70 - 99 mg/dL   BUN 45 (*) 6 - 23 mg/dL   Creatinine, Ser 1.84 (*) 0.50 - 1.10 mg/dL   Calcium 8.7  8.4 - 10.5 mg/dL   Total Protein 5.8 (*) 6.0 - 8.3 g/dL   Albumin 2.2 (*) 3.5 - 5.2 g/dL   AST 27  0 - 37 U/L   ALT 24  0 - 35 U/L   Alkaline Phosphatase 61  39 - 117 U/L   Total Bilirubin <0.2 (*) 0.3 - 1.2 mg/dL   GFR calc non Af Amer 26 (*) >90 mL/min   GFR calc Af Amer 31 (*) >90 mL/min   Comment: (NOTE)     The eGFR has been calculated using the CKD EPI equation.     This calculation has not been validated in all clinical situations.     eGFR's persistently <90 mL/min signify possible Chronic Kidney     Disease.  PROTIME-INR     Status: Abnormal   Collection Time    08/13/2013  9:45 AM      Result Value Ref Range   Prothrombin Time 23.2 (*) 11.6 - 15.2 seconds   INR 2.14 (*) 0.00 - 1.49   I-STAT CG4 LACTIC ACID, ED     Status: None   Collection Time    08/25/2013  9:58 AM      Result Value Ref Range   Lactic Acid, Venous 1.68  0.5 - 2.2 mmol/L  I-STAT CHEM 8, ED     Status: Abnormal   Collection Time    08/04/2013 10:01 AM      Result Value Ref Range   Sodium 142  137 - 147 mEq/L   Potassium 3.7  3.7 - 5.3 mEq/L   Chloride 106  96 - 112 mEq/L   BUN 43 (*) 6 - 23 mg/dL   Creatinine, Ser 2.20 (*) 0.50 - 1.10 mg/dL   Glucose, Bld 133 (*) 70 - 99 mg/dL   Calcium, Ion 1.15  1.13 - 1.30 mmol/L   TCO2 26  0 - 100 mmol/L   Hemoglobin 9.9 (*) 12.0 - 15.0 g/dL   HCT 29.0 (*) 36.0 - 46.0 %  I-STAT ARTERIAL BLOOD GAS, ED     Status: Abnormal   Collection Time    08/23/2013 10:45 AM      Result Value Ref Range   pH, Arterial 7.436  7.350 - 7.450   pCO2 arterial 36.3  35.0 - 45.0 mmHg   pO2, Arterial 91.0  80.0 - 100.0 mmHg   Bicarbonate 24.4 (*) 20.0 - 24.0 mEq/L   TCO2 25  0 - 100 mmol/L   O2 Saturation 97.0     Patient temperature 98.6 F     Collection site RADIAL, ALLEN'S TEST ACCEPTABLE     Drawn by Operator     Sample type ARTERIAL     Dg Chest Portable 1 View  08/23/2013   CLINICAL DATA:  Shortness of breath, weakness, sepsis  EXAM: PORTABLE CHEST - 1 VIEW  COMPARISON:  08/17/2013  FINDINGS: Cardiomegaly again noted. Central vascular congestion without pulmonary edema. Small to moderate right pleural effusion. Small left pleural effusion. Bilateral basilar atelectasis or infiltrate.  IMPRESSION: Central vascular congestion without pulmonary edema. Small to moderate right pleural effusion with right basilar atelectasis or infiltrate. Small left pleural effusion with left basilar atelectasis or infiltrate.   Electronically Signed   By: Lahoma Crocker M.D.   On: 08/19/2013 09:46     Assessment/Plan #1 Dyspnea: She has very difficult to manage dyspnea. It is unclear what component of her dyspnea is from congestive heart failure versus recurrent pleural effusions, pneumonia, or  COPD. At the skilled nursing facility she had her prednisone dose increased from 30 mg daily to 60 mg daily 2 days ago with increased nebulizer treatment which was not effective. In addition she had an increase in her Lasix and Aldactone dosing with slight rise in her serum creatinine level. She chronically runs a high pro BNP level. She is on Coumadin because of atrial fibrillation and her INR was therapeutic. We will continue current treatment with broad-spectrum antibiotics, pulse dose corticosteroids, and frequent albuterol nebulizer treatments. Will also recheck a transthoracic echocardiogram. We'll obtain an ultrasound of the thorax and consider repeat thoracenteses. I will record as an evaluation by pulmonary consultant as well. We will also request a speech pathologist evaluation to see if dysphagia and aspiration is contributing to her symptoms. #2 Diabetes Mellitus, Type II: Stable on insulin and she'll be continued on sliding scale insulin the hospital. #3 Sepsis Syndrome: Improved with IV fluids and stress dose corticosteroids. We will reduce her IV fluid infusion rate as she has evidence of CHF. #4 Anemia: Chronic with a low MCV and respiratory compromise that limits workup.  Circe Chilton G 07/31/2013, 6:41 PM

## 2013-08-24 NOTE — ED Provider Notes (Addendum)
CSN: [id number]     Arrival date & time 09/17/13  0900 History   First MD Initiated Contact with Patient 2013-09-17 [phone]     Chief Complaint  Patient presents with  . Shortness of Breath     (Consider location/radiation/quality/duration/timing/severity/associated sxs/prior Treatment) The history is provided by medical records, the EMS personnel, the nursing home and the patient. The history is limited by the condition of the patient.    Ariana [name] 73 y/o female followed by Dr. Ivery Quale who [facility] chief complaint of shortness of breath.  Patient's shortness of breath began around 5 AM this morning her oxygen saturations were 81% on room air.  She complains of productive cough of brown sputum for the past 3 days.  She is currently 90% on 4 L.  Past Medical History  Diagnosis Date  . Pneumonia   . Pleural effusion   . CHF (congestive heart failure)   . Paroxysmal atrial fibrillation   . COPD (chronic obstructive pulmonary disease)   . Stroke   . Dysarthria due to cerebrovascular accident   . 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 Coronary artery disease   . Diabetes mellitus without complication   . GERD (gastroesophageal reflux disease)   . Dementia    Past Surgical History  Procedure Laterality Date  . Thoracostomy    . Thoracentesis    . Breast surgery     History reviewed. No pertinent family history. History  Substance Use Topics  . Smoking status: Never Smoker   . Smokeless tobacco: Not on file  . Alcohol Use: No   OB History   Grav Para Term Preterm Abortions TAB SAB Ect Mult Living                 Review of Systems  Unable to perform ROS: Acuity of condition      Allergies  Baclofen and Clindamycin/lincomycin  Home Medications   Current Outpatient Rx  Name  Route  Sig  Dispense  Refill  . acetaminophen (TYLENOL) 325 MG tablet   Oral   Take 650 mg by mouth every 4  (four) hours as needed (for pain).         Marland Kitchen albuterol (PROVENTIL) (2.5 MG/3ML) 0.083% nebulizer solution   Nebulization   Take 2.5 mg by nebulization every 6 (six) hours as needed for wheezing or shortness of breath.         Marland Kitchen albuterol (PROVENTIL) (2.5 MG/3ML) 0.083% nebulizer solution   Nebulization   Take 3 mLs (2.5 mg total) by nebulization 2 (two) times daily.   75 mL   12   . aspirin (ASPIRIN ADULT LOW STRENGTH) 81 MG chewable tablet   Oral   Chew 81 mg by mouth daily.         Marland Kitchen atorvastatin (LIPITOR) 20 MG tablet   Oral   Take 20 mg by mouth at bedtime.         . clonazePAM (KLONOPIN) 0.5 MG tablet   Oral   Take 1 tablet (0.5 mg total) by mouth every morning.   15 tablet   0   . clonazePAM (KLONOPIN) 1 MG tablet   Oral   Take 1 mg by mouth at bedtime.         . docusate sodium (COLACE) 100 MG capsule   Oral   Take 100 mg by mouth 2 (two) times daily.         Marland Kitchen  famotidine (PEPCID) 20 MG tablet   Oral   Take 20 mg by mouth 2 (two) times daily.         . furosemide (LASIX) 80 MG tablet   Oral   Take 1 tablet (80 mg total) by mouth daily.   30 tablet   12   . HYDROcodone-acetaminophen (NORCO/VICODIN) 5-325 MG per tablet   Oral   Take 1 tablet by mouth 3 (three) times daily as needed for moderate pain.         Marland Kitchen insulin aspart (NOVOLOG) 100 UNIT/ML injection   Subcutaneous   Inject 0-9 Units into the skin 3 (three) times daily before meals. <120= no insulin, 121-150= 1 unit, 151-200=2 units, 201-250= 3 units 251-300= 5 units, 301-350= 7 units, 351-400= 9 units, >400= call md         . insulin detemir (LEVEMIR) 100 UNIT/ML injection   Subcutaneous   Inject 7 Units into the skin 2 (two) times daily.         . metoprolol tartrate (LOPRESSOR) 25 MG tablet   Oral   Take 0.5 tablets (12.5 mg total) by mouth 2 (two) times daily.   60 tablet   12   . Multiple Vitamins-Minerals (MULTIVITAMINS THER. W/MINERALS) TABS tablet   Oral   Take 1  tablet by mouth daily.         . nitroGLYCERIN (NITRODUR - DOSED IN MG/24 HR) 0.2 mg/hr patch   Transdermal   Place 0.2 mg onto the skin daily.         . predniSONE (DELTASONE) 10 MG tablet   Oral   Take 3 tablets (30 mg total) by mouth daily with breakfast.   90 tablet   0   . QUEtiapine (SEROQUEL) 100 MG tablet   Oral   Take 100 mg by mouth at bedtime.         . senna-docusate (SENOKOT-S) 8.6-50 MG per tablet   Oral   Take 1 tablet by mouth at bedtime. May take 1 additional tablet daily as needed for constipation         . spironolactone (ALDACTONE) 25 MG tablet   Oral   Take 1 tablet (25 mg total) by mouth daily.   30 tablet   12   . tiotropium (SPIRIVA) 18 MCG inhalation capsule   Inhalation   Place 1 capsule (18 mcg total) into inhaler and inhale daily.   30 capsule   12   . traZODone (DESYREL) 50 MG tablet   Oral   Take 50 mg by mouth at bedtime.         Marland Kitchen venlafaxine XR (EFFEXOR-XR) 75 MG 24 hr capsule   Oral   Take 75 mg by mouth daily.         Marland Kitchen warfarin (COUMADIN) 2 MG tablet   Oral   Take 1 tablet (2 mg total) by mouth daily.   30 tablet   12    BP 71/46  Pulse 91  Temp(Src) 98.7 F (37.1 C) (Oral)  Resp 18  SpO2 97% Physical Exam  Nursing note and vitals reviewed. Constitutional: She is oriented to person, place, and time. She appears distressed.  HENT:  Head: Normocephalic and atraumatic.  Eyes: Conjunctivae are normal. No scleral icterus.  Neck: Normal range of motion. No JVD present.  Cardiovascular: Normal rate, regular rhythm and normal heart sounds.  Exam reveals no gallop and no friction rub.   No murmur heard. Pulmonary/Chest: She is in respiratory distress. She has  no wheezes.  Abdominal: Soft. Bowel sounds are normal. She exhibits no distension and no mass. There is no tenderness. There is no guarding.  Neurological: She is alert and oriented to person, place, and time.  Skin: Skin is warm and dry.    ED Course   Procedures (including critical care time) Labs Review Labs Reviewed  URINE CULTURE  PRO B NATRIURETIC PEPTIDE  CBC WITH DIFFERENTIAL  COMPREHENSIVE METABOLIC PANEL  URINALYSIS, ROUTINE W REFLEX MICROSCOPIC  PROTIME-INR  I-STAT TROPOININ, ED   Imaging Review Dg Chest Portable 1 View  07/26/2013   CLINICAL DATA:  Shortness of breath, weakness, sepsis  EXAM: PORTABLE CHEST - 1 VIEW  COMPARISON:  08/17/2013  FINDINGS: Cardiomegaly again noted. Central vascular congestion without pulmonary edema. Small to moderate right pleural effusion. Small left pleural effusion. Bilateral basilar atelectasis or infiltrate.  IMPRESSION: Central vascular congestion without pulmonary edema. Small to moderate right pleural effusion with right basilar atelectasis or infiltrate. Small left pleural effusion with left basilar atelectasis or infiltrate.   Electronically Signed   By: Natasha MeadLiviu  Pop M.D.   On: 08/10/2013 09:46     EKG Interpretation   Date/Time:  Monday August 24 2013 09:09:19 EDT Ventricular Rate:  89 PR Interval:  61 QRS Duration: 138 QT Interval:  416 QTC Calculation: 506 R Axis:   -69 Text Interpretation:  Atrial fibrillation Multiple premature complexes,  vent  Short PR interval RBBB and LAFB Probable LVH with secondary repol  abnrm Probable posterior infarct, acute No significant change since last  tracing Confirmed by [facility]  [facility], WHITNEY ([id number]) on 07/27/2013 9:17:36 AM      MDM   Final diagnoses:  HCAP (healthcare-associated pneumonia)  Sepsis    11:01 AM BP 131/100  Pulse 106  Temp(Src) 100.1 F (37.8 C) (Rectal)  Resp 26  SpO2 93% Filed Vitals:   08/18/2013 1815 08/23/2013 1819 08/18/2013 1830 08/04/2013 1845  BP: 111/83 111/83 135/56 130/97  Pulse: 104 99 101 98  Temp:      TempSrc:      Resp: 22 22 28 24   SpO2: 92% 99% 93% 96%    Patient with what appears to be HCAP. Leukocytosis, elevated creatinine Pressures have responded to fluids with initial at 61/40, improved  with fluids I have spoken with Dr. Jarold MottoPatterson who will admit the patient for HCAP.  The patient appears reasonably stabilized for admission considering the current resources, flow, and capabilities available in the ED at this time, and I doubt any other [facility] requiring further screening and/or treatment in the ED prior to admission.   Arthor Captainbigail Dayna Geurts, PA-C 08/19/2013 1915  Arthor CaptainAbigail Raylan Troiani, PA-C 09/01/13 0900

## 2013-08-24 NOTE — ED Notes (Signed)
Carb modified diet ordered for pt per telephone order from Dr. Jarold MottoPatterson.

## 2013-08-24 NOTE — Progress Notes (Signed)
Upon entering patient room, patient sats of 89% on 6L [place].  Was given Albuterol treatment and placed on a 50% Venti mask after treatment was completed.  Sats now 97%.  RT will continue to monitor.

## 2013-08-24 NOTE — ED Notes (Addendum)
Per EMS, pt has been experiencing shortness of breath since 0500 this morning. Pt is a resident of [facility] and per nursing staff, O2 saturation levels were 81% on room air this morning. Pt has complained of a productive cough with brown sputum X3 days. Pt is not coughing on arrival. Pt now has an O2 saturation level of 98% on 4L. EMS also reported chest wall tenderness.

## 2013-08-24 NOTE — ED Provider Notes (Signed)
Medical screening examination/treatment/procedure(s) were conducted as a shared visit with non-physician practitioner(s) and myself.  I personally evaluated the patient during the encounter.   EKG Interpretation   Date/Time:  Monday August 24 2013 09:09:19 EDT Ventricular Rate:  89 PR Interval:  61 QRS Duration: 138 QT Interval:  416 QTC Calculation: 506 R Axis:   -69 Text Interpretation:  Atrial fibrillation Multiple premature complexes,  vent  Short PR interval RBBB and LAFB Probable LVH with secondary repol  abnrm Probable posterior infarct, acute No significant change since last  tracing Confirmed by [facility]  MD, Alphonzo LemmingsWHITNEY ([id number]) on 07/26/2013 9:17:36 AM      Pt with recent admission for CHF who presents with SOB, fever and cough with new evidence of HAP by CXR with leukocytosis.  Pt wheezing improved after albuterol/atrovent.  Pt started on abx.  Gwyneth SproutWhitney Enda Santo, MD 08/08/2013 2139

## 2013-08-24 NOTE — Progress Notes (Signed)
Upon patient assessment, I noticed a nitroglycerin patch to left upper chest dated 3/29. Patch removed due to low previous blood pressures. Ariana [name], [name] A

## 2013-08-24 NOTE — ED Notes (Addendum)
Called Pharmacy to have antibiotics STAT not in pyxis due to need of verification.

## 2013-08-25 ENCOUNTER — Inpatient Hospital Stay (HOSPITAL_COMMUNITY): Payer: Medicare Other

## 2013-08-25 DIAGNOSIS — J90 Pleural effusion, not elsewhere classified: Secondary | ICD-10-CM

## 2013-08-25 DIAGNOSIS — A419 Sepsis, unspecified organism: Secondary | ICD-10-CM

## 2013-08-25 DIAGNOSIS — I059 Rheumatic mitral valve disease, unspecified: Secondary | ICD-10-CM

## 2013-08-25 DIAGNOSIS — J9600 Acute respiratory failure, unspecified whether with hypoxia or hypercapnia: Secondary | ICD-10-CM

## 2013-08-25 LAB — COMPREHENSIVE METABOLIC PANEL
ALT: 19 U/L (ref 0–35)
AST: 23 U/L (ref 0–37)
Albumin: 1.8 g/dL — ABNORMAL LOW (ref 3.5–5.2)
Alkaline Phosphatase: 51 U/L (ref 39–117)
BUN: 45 mg/dL — AB (ref 6–23)
CHLORIDE: 104 meq/L (ref 96–112)
CO2: 21 meq/L (ref 19–32)
CREATININE: 1.75 mg/dL — AB (ref 0.50–1.10)
Calcium: 7.9 mg/dL — ABNORMAL LOW (ref 8.4–10.5)
GFR calc non Af Amer: 28 mL/min — ABNORMAL LOW (ref >90)
GFR, EST AFRICAN AMERICAN: 33 mL/min — AB (ref >90)
Glucose, Bld: 196 mg/dL — ABNORMAL HIGH (ref 70–99)
Potassium: 4.2 mEq/L (ref 3.7–5.3)
Sodium: 140 mEq/L (ref 137–147)
Total Protein: 5 g/dL — ABNORMAL LOW (ref 6.0–8.3)

## 2013-08-25 LAB — URINE MICROSCOPIC-ADD ON

## 2013-08-25 LAB — GLUCOSE, CAPILLARY
GLUCOSE-CAPILLARY: 229 mg/dL — AB (ref 70–99)
Glucose-Capillary: 113 mg/dL — ABNORMAL HIGH (ref 70–99)
Glucose-Capillary: 191 mg/dL — ABNORMAL HIGH (ref 70–99)
Glucose-Capillary: 196 mg/dL — ABNORMAL HIGH (ref 70–99)
Glucose-Capillary: 201 mg/dL — ABNORMAL HIGH (ref 70–99)
Glucose-Capillary: 241 mg/dL — ABNORMAL HIGH (ref 70–99)

## 2013-08-25 LAB — TROPONIN I
TROPONIN I: 0.4 ng/mL — AB (ref <0.30)
Troponin I: 0.57 ng/mL (ref <0.30)
Troponin I: 0.49 ng/mL (ref <0.30)
Troponin I: 0.38 ng/mL (ref <0.30)

## 2013-08-25 LAB — CBC
HCT: 27.6 % — ABNORMAL LOW (ref 36.0–46.0)
Hemoglobin: 8.1 g/dL — ABNORMAL LOW (ref 12.0–15.0)
MCH: 22.4 pg — ABNORMAL LOW (ref 26.0–34.0)
MCHC: 29.3 g/dL — ABNORMAL LOW (ref 30.0–36.0)
MCV: 76.5 fL — ABNORMAL LOW (ref 78.0–100.0)
PLATELETS: 201 10*3/uL (ref 150–400)
RBC: 3.61 MIL/uL — ABNORMAL LOW (ref 3.87–5.11)
RDW: 17.2 % — ABNORMAL HIGH (ref 11.5–15.5)
WBC: 10.7 10*3/uL — AB (ref 4.0–10.5)

## 2013-08-25 LAB — URINALYSIS, ROUTINE W REFLEX MICROSCOPIC
Bilirubin Urine: NEGATIVE
Glucose, UA: 250 mg/dL — AB
Hgb urine dipstick: NEGATIVE
Ketones, ur: NEGATIVE mg/dL
Leukocytes, UA: NEGATIVE
NITRITE: NEGATIVE
Specific Gravity, Urine: 1.021 (ref 1.005–1.030)
UROBILINOGEN UA: 0.2 mg/dL (ref 0.0–1.0)
pH: 5.5 (ref 5.0–8.0)

## 2013-08-25 LAB — PRO B NATRIURETIC PEPTIDE: Pro B Natriuretic peptide (BNP): 35647 pg/mL — ABNORMAL HIGH (ref 0–125)

## 2013-08-25 LAB — LACTIC ACID, PLASMA: LACTIC ACID, VENOUS: 4.2 mmol/L — AB (ref 0.5–2.2)

## 2013-08-25 LAB — PROTIME-INR
INR: 2.86 — ABNORMAL HIGH (ref 0.00–1.49)
Prothrombin Time: 29 seconds — ABNORMAL HIGH (ref 11.6–15.2)

## 2013-08-25 LAB — MAGNESIUM: Magnesium: 1.9 mg/dL (ref 1.5–2.5)

## 2013-08-25 LAB — PHOSPHORUS: Phosphorus: 5.2 mg/dL — ABNORMAL HIGH (ref 2.3–4.6)

## 2013-08-25 LAB — CORTISOL: Cortisol, Plasma: 33.9 ug/dL

## 2013-08-25 LAB — PROCALCITONIN: Procalcitonin: 0.32 ng/mL

## 2013-08-25 MED ORDER — HYDROCORTISONE NA SUCCINATE PF 100 MG IJ SOLR
50.0000 mg | Freq: Four times a day (QID) | INTRAMUSCULAR | Status: DC
  Administered 2013-08-25 – 2013-08-26 (×7): 50 mg via INTRAVENOUS
  Administered 2013-08-27 (×2): via INTRAVENOUS
  Administered 2013-08-27 – 2013-08-28 (×3): 50 mg via INTRAVENOUS
  Filled 2013-08-25 (×13): qty 1

## 2013-08-25 MED ORDER — SPIRONOLACTONE 12.5 MG HALF TABLET
12.5000 mg | ORAL_TABLET | Freq: Every day | ORAL | Status: DC
  Filled 2013-08-25: qty 1

## 2013-08-25 MED ORDER — FUROSEMIDE 10 MG/ML IJ SOLN
20.0000 mg | Freq: Two times a day (BID) | INTRAMUSCULAR | Status: DC
  Administered 2013-08-25: 20 mg via INTRAVENOUS
  Filled 2013-08-25 (×3): qty 2

## 2013-08-25 MED ORDER — FUROSEMIDE 10 MG/ML IJ SOLN
INTRAMUSCULAR | Status: AC
  Filled 2013-08-25: qty 2

## 2013-08-25 MED ORDER — SODIUM CHLORIDE 0.9 % IV BOLUS (SEPSIS)
500.0000 mL | Freq: Once | INTRAVENOUS | Status: AC
  Administered 2013-08-25: 500 mL via INTRAVENOUS

## 2013-08-25 MED ORDER — INSULIN ASPART 100 UNIT/ML ~~LOC~~ SOLN
0.0000 [IU] | SUBCUTANEOUS | Status: DC
  Administered 2013-08-25 (×2): 5 [IU] via SUBCUTANEOUS
  Administered 2013-08-25: 8 [IU] via SUBCUTANEOUS
  Administered 2013-08-25 – 2013-08-26 (×3): 3 [IU] via SUBCUTANEOUS
  Administered 2013-08-26: 5 [IU] via SUBCUTANEOUS
  Administered 2013-08-26 – 2013-08-27 (×2): 2 [IU] via SUBCUTANEOUS

## 2013-08-25 MED ORDER — PHENYLEPHRINE HCL 10 MG/ML IJ SOLN
30.0000 ug/min | INTRAMUSCULAR | Status: DC
  Filled 2013-08-25: qty 4

## 2013-08-25 NOTE — Evaluation (Signed)
Clinical/Bedside Swallow Evaluation Patient Details  Name: Ariana [name] MRN: [id number] Date of Birth: 01-12-1941  Today's Date: 08/25/2013 Time: 0900-0920 SLP Time Calculation (min): 20 min  Past Medical History:  Past Medical History  Diagnosis Date  . Pneumonia   . Pleural effusion   . CHF (congestive heart failure)   . Paroxysmal atrial fibrillation   . COPD (chronic obstructive pulmonary disease)   . Stroke   . Dysarthria due to cerebrovascular accident   . 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 Coronary artery disease   . Diabetes mellitus without complication   . GERD (gastroesophageal reflux disease)   . Dementia    Past Surgical History:  Past Surgical History  Procedure Laterality Date  . Thoracostomy    . Thoracentesis    . Breast surgery     HPI:  [name] presented 3/30 with a 2 day history of progressive dyspnea, hypoxia & fever. Was hypotensive with some response to fluids, lactate 1.7 and admitted for what was thought to be severe sepsis. Pt with a history of CVA, dysarthria. Has been seen by [facility] for similar admissions, basic respiratory and aspriation precautiosn suggested. No MBS on file.    Assessment / Plan / Recommendation Clinical Impression  Pt demonstrates significantly impaired endurance and respiration during a meal. She immediately becomes short of breath with the effort required to self feed and masticate. Given oral motor sensory deficits, impaired respiration and occasional cough during meal, suspect at least some penetration/aspiration occuring. Recommend MBS to determine severity of dysphagia and if any compensatory strategies may be helpful. Will pursue MBS later today.     Aspiration Risk  Moderate    Diet Recommendation Dysphagia 3 (Mechanical Soft);Thin liquid   Liquid Administration via: Cup;No straw Medication Administration: Whole meds with liquid Supervision: Staff to assist with  self feeding Compensations: Slow rate;Small sips/bites Postural Changes and/or Swallow Maneuvers: Seated upright 90 degrees    Other  Recommendations Recommended Consults: MBS Oral Care Recommendations: Oral care BID   Follow Up Recommendations  Skilled Nursing facility    Frequency and Duration        Pertinent Vitals/Pain NA    [facility] Swallow Goals     Swallow Study Prior Functional Status       General HPI: [name] presented 3/30 with a 2 day history of progressive dyspnea, hypoxia & fever. Was hypotensive with some response to fluids, lactate 1.7 and admitted for what was thought to be severe sepsis. Pt with a history of CVA, dysarthria. Has been seen by [facility] for similar admissions, basic respiratory and aspriation precautiosn suggested. No MBS on file.  Type of Study: Bedside swallow evaluation Diet Prior to this Study: Regular;Thin liquids Temperature Spikes Noted: No Respiratory Status: Room air History of Recent Intubation: No Behavior/Cognition: Alert;Cooperative;Pleasant mood Oral Cavity - Dentition: Edentulous;Dentures, not available Self-Feeding Abilities: Able to feed self;Needs set up Patient Positioning: Upright in bed Baseline Vocal Quality: Low vocal intensity;Hoarse Volitional Cough: Congested Volitional Swallow: Able to elicit    Oral/Motor/Sensory Function Overall Oral Motor/Sensory Function: Impaired Labial ROM: Reduced right Labial Symmetry: Abnormal symmetry right Labial Strength: Reduced Labial Sensation: Reduced Lingual ROM: Within Functional Limits Facial ROM: Reduced right Facial Symmetry: Right droop   Ice Chips     Thin Liquid Thin Liquid: Impaired Presentation: Cup;Self Fed Pharyngeal  Phase Impairments: Cough - Delayed    Nectar Thick Nectar Thick Liquid: Not tested   Honey Thick  Honey Thick Liquid: Not tested   Puree Puree: Impaired Presentation: Spoon;Self Fed Oral Phase Functional Implications: Right lateral sulci pocketing    Solid   GO    Solid: Impaired Presentation: Self Fed Oral Phase Impairments: Impaired mastication Oral Phase Functional Implications: Right lateral sulci pocketing Pharyngeal Phase Impairments: Cough - Delayed      Ariana Ditty, MA CCC-SLP [phone]  Ariana [name], Ariana [name] 08/25/2013,9:35 AM

## 2013-08-25 NOTE — Procedures (Signed)
Objective Swallowing Evaluation: Modified Barium Swallowing Study  Patient Details  Name: Zetta Stoneman MRN: [id number] Date of Birth: 1941-03-23  Today's Date: 08/25/2013 Time: 4782-9562 SLP Time Calculation (min): 24 min  Past Medical History:  Past Medical History  Diagnosis Date  . Pneumonia   . Pleural effusion   . CHF (congestive heart failure)   . Paroxysmal atrial fibrillation   . COPD (chronic obstructive pulmonary disease)   . Stroke   . Dysarthria due to cerebrovascular accident   . 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 Coronary artery disease   . Diabetes mellitus without complication   . GERD (gastroesophageal reflux disease)   . Dementia    Past Surgical History:  Past Surgical History  Procedure Laterality Date  . Thoracostomy    . Thoracentesis    . Breast surgery     HPI:  [name] presented 3/30 with a 2 day history of progressive dyspnea, hypoxia & fever. Was hypotensive with some response to fluids, lactate 1.7 and admitted for what was thought to be severe sepsis. Pt with a history of CVA, dysarthria. Has been seen by SLp for similar admissions, basic respiratory and aspriation precautiosn suggested. No MBS on file.      Assessment / Plan / Recommendation Clinical Impression  Dysphagia Diagnosis: Mild pharyngeal phase dysphagia Clinical impression: Pt presents wia mild to moderate oropharyngeal dysphagia with incomplete airway closure during the swallow, leading to consistent deep flash penetration of thin liquids. Occasional aspiration or penetration events likely with this presentation. Suspect airway closure is reduced due to high respiratory need and pt struggle to keep airway closed for brief apneic period during swallowing. SLP instructed pt in a chin tuck, with a straw to ease of positioning, which reduced depth of penetration, but did not prevent it completely. Hopefully this strategy will reduce risk  of possible aspiration events. Recommend pt conitnue a dys 3 (mech soft) diet and thin liqudis, but with a chin tuck. SLP will follow for carry over.     Treatment Recommendation  Therapy as outlined in treatment plan below    Diet Recommendation Dysphagia 3 (Mechanical Soft);Thin liquid   Liquid Administration via: Straw Medication Administration: Whole meds with puree Supervision: Patient able to self feed;Intermittent supervision to cue for compensatory strategies Compensations: Slow rate;Small sips/bites Postural Changes and/or Swallow Maneuvers: Chin tuck;Seated upright 90 degrees    Other  Recommendations Oral Care Recommendations: Oral care BID   Follow Up Recommendations  Skilled Nursing facility    Frequency and Duration min 2x/week  2 weeks   Pertinent Vitals/Pain NA    SLP Swallow Goals     General HPI: [name] presented 3/30 with a 2 day history of progressive dyspnea, hypoxia & fever. Was hypotensive with some response to fluids, lactate 1.7 and admitted for what was thought to be severe sepsis. Pt with a history of CVA, dysarthria. Has been seen by SLp for similar admissions, basic respiratory and aspriation precautiosn suggested. No MBS on file.  Type of Study: Modified Barium Swallowing Study Reason for Referral: Objectively evaluate swallowing function Diet Prior to this Study: Dysphagia 3 (soft);Thin liquids Temperature Spikes Noted: No Respiratory Status: Nasal cannula History of Recent Intubation: No Behavior/Cognition: Alert;Cooperative;Pleasant mood Oral Cavity - Dentition: Edentulous;Dentures, not available Oral Motor / Sensory Function: Impaired - see Bedside swallow eval Self-Feeding Abilities: Able to feed self Patient Positioning: Upright in chair Baseline Vocal Quality: Low vocal intensity;Hoarse Volitional  Cough: Congested Volitional Swallow: Able to elicit Anatomy: Within functional limits Pharyngeal Secretions: Not observed secondary  MBS    Reason for Referral Objectively evaluate swallowing function   Oral Phase Oral Preparation/Oral Phase Oral Phase: Impaired Oral - Thin Oral - Thin Cup: Within functional limits Oral - Thin Straw: Within functional limits Oral - Solids Oral - Puree: Within functional limits Oral - Mechanical Soft: Impaired mastication;Weak lingual manipulation;Pocketing in anterior sulcus;Right pocketing in lateral sulci;Delayed oral transit (pt cleared independently)   Pharyngeal Phase Pharyngeal Phase Pharyngeal Phase: Impaired Pharyngeal - Thin Pharyngeal - Thin Cup: Reduced airway/laryngeal closure;Penetration/Aspiration during swallow Penetration/Aspiration details (thin cup): Material enters airway, CONTACTS cords then ejected out Pharyngeal - Thin Straw: Reduced airway/laryngeal closure;Penetration/Aspiration during swallow Penetration/Aspiration details (thin straw): Material enters airway, CONTACTS cords then ejected out Pharyngeal - Solids Pharyngeal - Puree: Delayed swallow initiation;Premature spillage to valleculae Pharyngeal - Mechanical Soft: Delayed swallow initiation;Premature spillage to valleculae Pharyngeal - Pill: Delayed swallow initiation  Cervical Esophageal Phase    GO             Harlon DittyBonnie Sarah-Jane Nazario, MA CCC-SLP [phone]  Claudine MoutonDeBlois, Giavanni Odonovan Caroline 08/25/2013, 2:13 PM

## 2013-08-25 NOTE — Progress Notes (Signed)
ANTICOAGULATION CONSULT NOTE - Initial Consult  Pharmacy Consult for coumadin Indication: atrial fibrillation  Allergies  Allergen Reactions  . Baclofen Other (See Comments)    Reaction unknown  . Clindamycin/Lincomycin Other (See Comments)    Reaction unknown    Patient Measurements: Weight: 179 lb 10.8 oz (81.5 kg)   Vital Signs: Temp: 97.5 F (36.4 C) (03/31 0731) Temp src: Oral (03/31 0731) BP: 107/67 mmHg (03/31 0815) Pulse Rate: 109 (03/31 0815)  Labs:  Recent Labs  2014-04-28 0945 2014-04-28 1001 08/25/13 0246 08/25/13 0525  HGB 8.9* 9.9*  --  8.1*  HCT 29.5* 29.0*  --  27.6*  PLT PLATELET CLUMPS NOTED ON SMEAR, COUNT APPEARS ADEQUATE  --   --  201  LABPROT 23.2*  --   --  29.0*  INR 2.14*  --   --  2.86*  CREATININE 1.84* 2.20*  --  1.75*  TROPONINI  --   --  0.57* 0.49*    The CrCl is unknown because both a height and weight (above a minimum accepted value) are required for this calculation.   Medical History: Past Medical History  Diagnosis Date  . Pneumonia   . Pleural effusion   . CHF (congestive heart failure)   . Paroxysmal atrial fibrillation   . COPD (chronic obstructive pulmonary disease)   . Stroke   . Dysarthria due to cerebrovascular accident   . 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 Coronary artery disease   . Diabetes mellitus without complication   . GERD (gastroesophageal reflux disease)   . Dementia    Assessment: Patient is a 73 y. o F on coumadin PTA for Afib. SNF's record indicated that she was on 4mg  daily at their facility and this was resumed on admission.  INR increased sharply from 2.14 to 2.86 today after 4 mg dose given last night.  OK with Dr. Kendrick FriesMcQuaid for pharmacy to manage her coumadin.  Unfortunately, coumadin was scheduled to be given at 10 AM and RN already gave patient her dose this morning.  Will d/c standing coumadin order, f/u with AM labs, and order dose based on  INR.  Goal of Therapy:  INR 2-3    Plan:  1) d/c standing coumadin  2) f/u with AM labs  Naftula Donahue P 08/25/2013,9:58 AM

## 2013-08-25 NOTE — Progress Notes (Signed)
Subjective: I appreciate input from pulmonary and CCM consultants. She has been on prednisone 30 mg daily for over 2 weeks. Today she notes that dyspnea is somewhat improved. She is not having chest pain or significant productive cough. At the SNF I had been using lasix 80 mg po bid and aldactone 25 qd  With nitrodur to treat CHF while monitoring BMETs closely, in an attempt to prevent reaccumulation of pleural effusions and worsened CHF. We have also been monitoring body weights closely and she had only gained about 1 pound since last hospital discharge.   Objective: Vital signs in last 24 hours: Temp:  [96.4 F (35.8 C)-100.2 F (37.9 C)] 97.5 F (36.4 C) (03/31 0731) Pulse Rate:  [41-109] 109 (03/31 0815) Resp:  [18-30] 23 (03/31 0815) BP: (61-153)/(34-120) 107/67 mmHg (03/31 0815) SpO2:  [84 %-100 %] 95 % (03/31 0815) FiO2 (%):  [50 %] 50 % (03/31 0815) Weight:  [81.5 kg (179 lb 10.8 oz)] 81.5 kg (179 lb 10.8 oz) (03/30 2200) Weight change:    Intake/Output from previous day: 03/30 0701 - 03/31 0700 In: 700.7 [P.O.:240; I.V.:152.3; IV Piggyback:308.3] Out: 205 [Urine:205]   General appearance: alert, cooperative and mild distress Resp: bilateral diffuse wheezing with decreased breath sounds in the right lung base Cardio: irregularly irregular rhythm GI: soft, non-tender; bowel sounds normal; no masses,  no organomegaly Extremities: bilateral 1+ leg edema  Lab Results:  Recent Labs  07/29/2013 0945 08/04/2013 1001 08/25/13 0525  WBC 13.3*  --  10.7*  HGB 8.9* 9.9* 8.1*  HCT 29.5* 29.0* 27.6*  PLT PLATELET CLUMPS NOTED ON SMEAR, COUNT APPEARS ADEQUATE  --  201   BMET  Recent Labs  07/29/2013 0945 08/01/2013 1001 08/25/13 0525  NA 143 142 140  K 3.9 3.7 4.2  CL 103 106 104  CO2 24  --  21  GLUCOSE 130* 133* 196*  BUN 45* 43* 45*  CREATININE 1.84* 2.20* 1.75*  CALCIUM 8.7  --  7.9*   CMET CMP     Component Value Date/Time   NA 140 08/25/2013 0525   K 4.2  08/25/2013 0525   CL 104 08/25/2013 0525   CO2 21 08/25/2013 0525   GLUCOSE 196* 08/25/2013 0525   BUN 45* 08/25/2013 0525   CREATININE 1.75* 08/25/2013 0525   CALCIUM 7.9* 08/25/2013 0525   PROT 5.0* 08/25/2013 0525   ALBUMIN 1.8* 08/25/2013 0525   AST 23 08/25/2013 0525   ALT 19 08/25/2013 0525   ALKPHOS 51 08/25/2013 0525   BILITOT <0.2* 08/25/2013 0525   GFRNONAA 28* 08/25/2013 0525   GFRAA 33* 08/25/2013 0525    CBG (last 3)  No results found for this basename: GLUCAP,  in the last 72 hours  INR RESULTS:   Lab Results  Component Value Date   INR 2.86* 08/25/2013   INR 2.14* 08/23/2013   INR 1.14 08/18/2013     Studies/Results: [place] Chest  08/25/2013   CLINICAL DATA:  Evaluate for pleural effusion.  EXAM: CHEST ULTRASOUND  COMPARISON:  08/17/2013  FINDINGS: Bilateral pleural effusions without visible septation or debris. The effusion on the right is moderate volume when correlated with chest x-ray. The effusion on the left is small volume. No visible loculation. Extensive atelectasis of the right lower lobe.  IMPRESSION: Moderate right and small left pleural effusion. No visible septation or other complex feature.   Electronically Signed   By: Tiburcio Pea M.D.   On: 08/25/2013 06:57   Dg Chest [facility] [address]  08/25/2013   CLINICAL DATA:  Hypoxemia.  EXAM: PORTABLE CHEST - 1 VIEW  COMPARISON:  DG CHEST 1V PORT dated 2014/03/06  FINDINGS: The cardiac silhouette remains moderate to severely enlarged, mediastinal silhouette is nonsuspicious, moderately calcified aortic knob. Slightly increasing moderate interstitial prominence with bibasilar patchy airspace opacities and small to moderate right, small left pleural effusion. No pneumothorax.  Vascular calcifications in the upper extremities. Osseous structures are nonsuspicious ; left posterior lateral remote rib fractures. Lumbar instrumentation with thoracic scoliosis.  IMPRESSION: Stable cardiomegaly with apparent worsening interstitial edema.  Bibasilar airspace opacities likely reflect atelectasis and/or confluent edema. Shifting pleural effusions, small to moderate on the right, small left.   Electronically Signed   By: Awilda Metroourtnay  Bloomer   On: 08/25/2013 05:23   Dg Chest Portable 1 View  2014/03/06   CLINICAL DATA:  Shortness of breath, weakness, sepsis  EXAM: PORTABLE CHEST - 1 VIEW  COMPARISON:  08/17/2013  FINDINGS: Cardiomegaly again noted. Central vascular congestion without pulmonary edema. Small to moderate right pleural effusion. Small left pleural effusion. Bilateral basilar atelectasis or infiltrate.  IMPRESSION: Central vascular congestion without pulmonary edema. Small to moderate right pleural effusion with right basilar atelectasis or infiltrate. Small left pleural effusion with left basilar atelectasis or infiltrate.   Electronically Signed   By: Natasha MeadLiviu  Pop M.D.   On: 02015/10/10 09:46    Medications: I have reviewed the patient's current medications.  Assessment/Plan: #1 Dyspnea: some interval improvement although BP which was elevated last evening is now somewhat low. 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 It is difficult to determine her intravascular fluid volume. Will try to do so via daily BNP tests as well as intake and output measurements. Today will check echo and chest US for pleural effusions. Will also check cultures and procalcitonin levels. If there is any difficulty [facility] call MD then I can be contacted via cell number [phone]. #2 DM2: stable on SSI   LOS: 1 day   Dung Prien G 08/25/2013, 8:39 AM

## 2013-08-25 NOTE — Progress Notes (Signed)
Patient hypotensive with no options for support. Internal Medicine paged several times starting at 0130 with no response. Response from IM intern at 0200. Was told this patient is not in their assignment. Internal Medicine MD Eloise HarmanPaterson, from skilled nursing center left no number for contact. Blood pressure increased spontaneously, no other changes noted. Will plan to contact E-link MD if any further hypotension issues. Jacqulynn CadetPotter, Joli Koob A

## 2013-08-25 NOTE — Consult Note (Addendum)
PULMONARY / CRITICAL CARE MEDICINE   Name: Ariana [name] MRN: [id number] DOB: 11-Aug-1940    ADMISSION DATE:  09-13-13 CONSULTATION DATE:  2013/09/13  REFERRING MD :  Eloise Harman PRIMARY SERVICE: Internal Med  CHIEF COMPLAINT:  sepsis  BRIEF PATIENT DESCRIPTION: [name] presented 3/30 with a 2 day history of progressive dyspnea, hypoxia & fever. Was hypotensive with some response to fluids, lactate 1.7  and admitted for what was thought to be severe sepsis. Early morning 3/31 remained hypotensive and PCCM was asked to see.    SIGNIFICANT EVENTS / STUDIES:  3/30 - Admitted  LINES / TUBES: PIV  CULTURES: Blood 3/30>>> Urine 3/30 >>>  ANTIBIOTICS: Cefepime 3/30 >>> Vancomycin 3/30 >>>  HISTORY OF PRESENT ILLNESS:  73 year old female with PMH as below, which includes a-fib, COPD, CHF (normal EF as of 04/2013), and CVA. Presenting from SNF 3/30 where she had a 2 day history of dyspnea. She was seen by her physician at the [facility] 3/28 and he adjusted her medications, 3/30 her dyspnea had not improved and she also had fevers. She was brought to the ED where she was found to be hypotensive and was presumed to be septic. She was admitted and treated with antibiotics. Early AM 3/31 she remained hypotensive and was given some IV fluid, which she did not respond to. PCCM asked to see.   PAST MEDICAL HISTORY :  Past Medical History  Diagnosis Date  . Pneumonia   . Pleural effusion   . CHF (congestive heart failure)   . Paroxysmal atrial fibrillation   . COPD (chronic obstructive pulmonary disease)   . Stroke   . Dysarthria due to cerebrovascular accident   . 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 Coronary artery disease   . Diabetes mellitus without complication   . GERD (gastroesophageal reflux disease)   . Dementia    Past Surgical History  Procedure Laterality Date  . Thoracostomy    . Thoracentesis    . Breast surgery     Prior  to Admission medications   Medication Sig Start Date End Date Taking? Authorizing Provider  acetaminophen (TYLENOL) 325 MG tablet Take 650 mg by mouth every 4 (four) hours as needed (for pain).   Yes Historical Provider, MD  albuterol (PROVENTIL) (2.5 MG/3ML) 0.083% nebulizer solution Take 2.5 mg by nebulization every 6 (six) hours as needed for wheezing or shortness of breath.   Yes Historical Provider, MD  albuterol (PROVENTIL) (2.5 MG/3ML) 0.083% nebulizer solution Take 3 mLs (2.5 mg total) by nebulization 2 (two) times daily. 08/18/13  Yes Jarome Matin, MD  aspirin (ASPIRIN ADULT LOW STRENGTH) 81 MG chewable tablet Chew 81 mg by mouth daily.   Yes Historical Provider, MD  atorvastatin (LIPITOR) 20 MG tablet Take 20 mg by mouth at bedtime.   Yes Historical Provider, MD  clonazePAM (KLONOPIN) 0.5 MG tablet Take 1 tablet (0.5 mg total) by mouth every morning. 06/17/13  Yes Shanker Levora Dredge, MD  clonazePAM (KLONOPIN) 1 MG tablet Take 1 mg by mouth 2 (two) times daily.    Yes Historical Provider, MD  docusate sodium (COLACE) 100 MG capsule Take 100 mg by mouth 2 (two) times daily.   Yes Historical Provider, MD  famotidine (PEPCID) 20 MG tablet Take 20 mg by mouth 2 (two) times daily.   Yes Historical Provider, MD  furosemide (LASIX) 80 MG tablet Take 80 mg by mouth 2 (two) times daily.   Yes  Historical Provider, MD  HYDROcodone-acetaminophen (NORCO/VICODIN) 5-325 MG per tablet Take 1 tablet by mouth 3 (three) times daily as needed for moderate pain.   Yes Historical Provider, MD  insulin aspart (NOVOLOG) 100 UNIT/ML injection Inject 0-9 Units into the skin 3 (three) times daily before meals. <120= no insulin, 121-150= 1 unit, 151-200=2 units, 201-250= 3 units 251-300= 5 units, 301-350= 7 units, 351-400= 9 units, >400= call md   Yes Historical Provider, MD  insulin detemir (LEVEMIR) 100 UNIT/ML injection Inject 7 Units into the skin 2 (two) times daily.   Yes Historical Provider, MD  iron  polysaccharides (NU-IRON) 150 MG capsule Take 150 mg by mouth daily.   Yes Historical Provider, MD  metoprolol succinate (TOPROL-XL) 25 MG 24 hr tablet Take 25 mg by mouth 2 (two) times daily.   Yes Historical Provider, MD  Multiple Vitamins-Minerals (MULTIVITAMINS THER. W/MINERALS) TABS tablet Take 1 tablet by mouth daily.   Yes Historical Provider, MD  nitroGLYCERIN (NITRODUR - DOSED IN MG/24 HR) 0.2 mg/hr patch Place 0.2 mg onto the skin daily.   Yes Historical Provider, MD  oseltamivir (TAMIFLU) 75 MG capsule Take 75 mg by mouth daily.   Yes Historical Provider, MD  predniSONE (DELTASONE) 10 MG tablet Take 60 mg by mouth daily with breakfast.   Yes Historical Provider, MD  QUEtiapine (SEROQUEL) 100 MG tablet Take 100 mg by mouth at bedtime.   Yes Historical Provider, MD  senna-docusate (SENOKOT-S) 8.6-50 MG per tablet Take 1 tablet by mouth at bedtime.    Yes Historical Provider, MD  spironolactone (ALDACTONE) 25 MG tablet Take 1 tablet (25 mg total) by mouth daily. 08/18/13  Yes Jarome Matin, MD  tiotropium (SPIRIVA) 18 MCG inhalation capsule Place 1 capsule (18 mcg total) into inhaler and inhale daily. 08/18/13  Yes Jarome Matin, MD  traZODone (DESYREL) 50 MG tablet Take 50 mg by mouth at bedtime.   Yes Historical Provider, MD  venlafaxine XR (EFFEXOR-XR) 75 MG 24 hr capsule Take 75 mg by mouth daily.   Yes Historical Provider, MD  warfarin (COUMADIN) 4 MG tablet Take 4 mg by mouth daily.   Yes Historical Provider, MD   Allergies  Allergen Reactions  . Baclofen Other (See Comments)    Reaction unknown  . Clindamycin/Lincomycin Other (See Comments)    Reaction unknown    FAMILY HISTORY:  History reviewed. No pertinent family history. SOCIAL HISTORY:  reports that she has never smoked. She does not have any smokeless tobacco history on file. She reports that she does not drink alcohol or use illicit drugs.  REVIEW OF SYSTEMS:  Unable as patient lethargic  SUBJECTIVE:   VITAL  SIGNS: Temp:  [96.4 F (35.8 C)-100.2 F (37.9 C)] 96.4 F (35.8 C) (03/31 0400) Pulse Rate:  [41-106] 63 (03/31 0400) Resp:  [18-30] 22 (03/31 0400) BP: (61-153)/(34-120) 83/57 mmHg (03/31 0400) SpO2:  [84 %-100 %] 99 % (03/31 0400) FiO2 (%):  [50 %] 50 % (03/31 0000) Weight:  [81.5 kg (179 lb 10.8 oz)] 81.5 kg (179 lb 10.8 oz) (03/30 2200) HEMODYNAMICS:   VENTILATOR SETTINGS: Vent Mode:  [-]  FiO2 (%):  [50 %] 50 % INTAKE / OUTPUT: Intake/Output     03/30 0701 - 03/31 0700   I.V. (mL/kg) 92.3 (1.1)   IV Piggyback 308.3   Total Intake(mL/kg) 400.7 (4.9)   Net +400.7       Urine Occurrence 3 x     PHYSICAL EXAMINATION: General:  Acutely ill appearing female in no  apparent distress Neuro:  somnolent , alert interactive per RN prior to giving seroquel HEENT:  NCAT Cardiovascular:  Irregular rhythm  Lungs:  Coarse throughout, Left inspiratory wheeze Abdomen:  Soft, non-distended Musculoskeletal:  No acute deformity Skin:  Intact, +1 edema BLE  LABS:  CBC  Recent Labs Lab 2013/08/28 0945 28-Aug-2013 1001  WBC 13.3*  --   HGB 8.9* 9.9*  HCT 29.5* 29.0*  PLT PLATELET CLUMPS NOTED ON SMEAR, COUNT APPEARS ADEQUATE  --    Coag's  Recent Labs Lab 2013-08-28 0945  INR 2.14*   BMET  Recent Labs Lab August 28, 2013 0945 2013-08-28 1001  NA 143 142  K 3.9 3.7  CL 103 106  CO2 24  --   BUN 45* 43*  CREATININE 1.84* 2.20*  GLUCOSE 130* 133*   Electrolytes  Recent Labs Lab 2013-08-28 0945  CALCIUM 8.7   Sepsis Markers  Recent Labs Lab 08/28/2013 0958  LATICACIDVEN 1.68   ABG  Recent Labs Lab 08-28-13 1045  PHART 7.436  PCO2ART 36.3  PO2ART 91.0   Liver Enzymes  Recent Labs Lab 08-28-2013 0945  AST 27  ALT 24  ALKPHOS 61  BILITOT <0.2*  ALBUMIN 2.2*   Cardiac Enzymes  Recent Labs Lab 08/28/2013 0945 08/25/13 0246  TROPONINI  --  0.57*  PROBNP 37282.0*  --    Glucose  Recent Labs Lab 08/18/13 0558 08/18/13 1056 08/18/13 1649  GLUCAP 96  259* 133*    Imaging Dg Chest Port 1 View  08/25/2013   CLINICAL DATA:  Hypoxemia.  EXAM: PORTABLE CHEST - 1 VIEW  COMPARISON:  DG CHEST 1V PORT dated 2013/08/28  FINDINGS: The cardiac silhouette remains moderate to severely enlarged, mediastinal silhouette is nonsuspicious, moderately calcified aortic knob. Slightly increasing moderate interstitial prominence with bibasilar patchy airspace opacities and small to moderate right, small left pleural effusion. No pneumothorax.  Vascular calcifications in the upper extremities. Osseous structures are nonsuspicious ; left posterior lateral remote rib fractures. Lumbar instrumentation with thoracic scoliosis.  IMPRESSION: Stable cardiomegaly with apparent worsening interstitial edema. Bibasilar airspace opacities likely reflect atelectasis and/or confluent edema. Shifting pleural effusions, small to moderate on the right, small left.   Electronically Signed   By: Awilda Metro   On: 08/25/2013 05:23   Dg Chest Portable 1 View  08/28/2013   CLINICAL DATA:  Shortness of breath, weakness, sepsis  EXAM: PORTABLE CHEST - 1 VIEW  COMPARISON:  08/17/2013  FINDINGS: Cardiomegaly again noted. Central vascular congestion without pulmonary edema. Small to moderate right pleural effusion. Small left pleural effusion. Bilateral basilar atelectasis or infiltrate.  IMPRESSION: Central vascular congestion without pulmonary edema. Small to moderate right pleural effusion with right basilar atelectasis or infiltrate. Small left pleural effusion with left basilar atelectasis or infiltrate.   Electronically Signed   By: Natasha Mead M.D.   On: August 28, 2013 09:46    ASSESSMENT / PLAN:  PULMONARY A: Pulmonary edema H/o 'COPD' -never smoker Pleural effusions - transudative 05/2013 P:   Supplemental O2 as needed, Goal SpO2 > 92% Follow CXR Will hold diuresis until BP can support as she is not in acute respiratory distress Would question diagnosis of COPD here, unable to get a  detailed smoking history    CARDIOVASCULAR A:  Hypotension, nml lactate Acute on Chronic diastolic CHF  P:  Will tolerate SBP goal > 80 if end organ perfusion intact Will hold pressors for now, medications likley contributing to hypotension. Her POA is ok with pressors if needed.  Strict I&O -  insert foley Trend lactate Check BNP Hold metoprolol Hold sedating meds . RENAL A:   CKD (baseline creat 1.8)  P:   Hold IVF Monitor BMP  GASTROINTESTINAL A:   No acute issue  P:   Supportive Care  HEMATOLOGIC A:   Anemia ? Chronic disease  P:  Monitor CBC Transfuse for only Hb <7 Not sure she is a good candidate for long term coumadin  INFECTIOUS A:   Sepsis unlikely no clear source, absent fevers, WBC normal   P:   Continue empiric antibiotic, low threshold to d/c pending cultures and lactate. Can use procalcitonin to guide Monitor WBC and fever curve  ENDOCRINE A:   Recent steroid use for presumed COPD exacerbation - Unclear duration DM P:   Check cortisol, use stress dose steroids. Check CBG, SSI Levemir  NEUROLOGIC A:  Acute encephalopathy  - likely secondary to sedating PM medications.  P:   Hold sedating medications.   Joneen RoachPaul Hoffman, ACNP [facility] Pager [phone] or [phone]   Summary - Not convinced low BP is due to sepsis, reasonable to treat as adrenal insufficiency & tolerate low SBP readings as long as perfusion OK. Hold diuretics 7 sedating meds until BP improved Care limitations appropriate    I have personally obtained a history, examined the patient, evaluated laboratory and imaging results, formulated the assessment and plan and placed orders. CRITICAL CARE: The patient is critically ill with multiple organ systems failure and requires high complexity decision making for assessment and support, frequent evaluation and titration of therapies, application of advanced monitoring technologies and extensive  interpretation of multiple databases. Critical Care Time devoted to patient care services described in this note is 45 minutes.   Cyril Mourningakesh Alva MD. Tonny BollmanFCCP. [facility] Pager [phone] If no response call [phone]

## 2013-08-25 NOTE — Progress Notes (Addendum)
eLink Physician-Brief Progress Note Patient Name: Ariana [name] DOB: 03/26/1941 MRN: [id number]  Date of Service  08/25/2013   HPI/Events of Note   After 500cc bolus , sbp 75, MAP 54, HR 70 with Afib. Not in distress. On 50% FAce mask all night  CXR yesterday looks wet with bNP 37K and trop 0.5- seel labs below   eICU Interventions  Repeat 250cc fluid bolus saline Stat cxr Stat BNP, troponin, cbc, bmet, lactic, pct Order ECHO Will have bedside PCCM NP evaluate       Ariana [name] 08/25/2013, 4:52 AM   PULMONARY  Recent Labs Lab 14-Jun-2013 1001 14-Jun-2013 1045  PHART  --  7.436  PCO2ART  --  36.3  PO2ART  --  91.0  HCO3  --  24.4*  TCO2 26 25  O2SAT  --  97.0    CBC  Recent Labs Lab 08/18/13 0520 14-Jun-2013 0945 14-Jun-2013 1001  HGB 8.6* 8.9* 9.9*  HCT 27.9* 29.5* 29.0*  WBC 9.6 13.3*  --   PLT 232 PLATELET CLUMPS NOTED ON SMEAR, COUNT APPEARS ADEQUATE  --     COAGULATION  Recent Labs Lab 08/18/13 0520 14-Jun-2013 0945  INR 1.14 2.14*    CARDIAC   Recent Labs Lab 08/25/13 0246  TROPONINI 0.57*    Recent Labs Lab 14-Jun-2013 0945  PROBNP 37282.0*     CHEMISTRY  Recent Labs Lab 08/18/13 0520 14-Jun-2013 0945 14-Jun-2013 1001  NA 141 143 142  K 4.5 3.9 3.7  CL 104 103 106  CO2 24 24  --   GLUCOSE 96 130* 133*  BUN 55* 45* 43*  CREATININE 1.89* 1.84* 2.20*  CALCIUM 8.3* 8.7  --    The CrCl is unknown because both a height and weight (above a minimum accepted value) are required for this calculation.   LIVER  Recent Labs Lab 08/18/13 0520 14-Jun-2013 0945  AST  --  27  ALT  --  24  ALKPHOS  --  61  BILITOT  --  <0.2*  PROT  --  5.8*  ALBUMIN  --  2.2*  INR 1.14 2.14*     INFECTIOUS  Recent Labs Lab 14-Jun-2013 0958  LATICACIDVEN 1.68     ENDOCRINE CBG (last 3)  No results found for this basename: GLUCAP,  in the last 72 hours       IMAGING x48h  Dg Chest Portable 1 View  02-03-14   CLINICAL DATA:  Shortness of  breath, weakness, sepsis  EXAM: PORTABLE CHEST - 1 VIEW  COMPARISON:  08/17/2013  FINDINGS: Cardiomegaly again noted. Central vascular congestion without pulmonary edema. Small to moderate right pleural effusion. Small left pleural effusion. Bilateral basilar atelectasis or infiltrate.  IMPRESSION: Central vascular congestion without pulmonary edema. Small to moderate right pleural effusion with right basilar atelectasis or infiltrate. Small left pleural effusion with left basilar atelectasis or infiltrate.   Electronically Signed   By: Natasha MeadLiviu  Pop M.D.   On: 009-09-15 09:46

## 2013-08-25 NOTE — Progress Notes (Signed)
[facility] PCCM  Partner's note reviewed, patient seen and examined  BP improved with stress dose steroids Cortisol pending Lactic acid normal Not septic   Has CHF, would avoid further IVF  OK to transfer back to SDU, [facility] from [facility] standpoint  Yolonda KidaMCQUAID, DOUGLAS [place] PCCM Pager: [phone] Cell: [phone] If no response, call [phone]

## 2013-08-25 NOTE — Progress Notes (Signed)
eLink Physician-Brief Progress Note Patient Name: Ariana AmatoShirley [name] DOB: 1940/08/16 MRN: [id number]  Date of Service  08/25/2013   HPI/Events of Note   SBP 80s, MAP 50s, HR 55, Adm: sepsis dx. ECHO dec 2014: EF looks normal  eICU Interventions  500cc saline bolus    Hypotension  - major  [name] 08/25/2013, 3:33 AM

## 2013-08-25 NOTE — Progress Notes (Signed)
Patient remains hypotensive. Dr. Marchelle Gearingamaswamy from E-link called. 500 NS bolus order. Will continue to monitor. Ariana [name], [name] A

## 2013-08-25 NOTE — Progress Notes (Signed)
Patient remains hypotensive after 500 cc ns bolus. E-link md called. Orders given. See mar. Additional 250 cc ns bolus given. Jacqulynn CadetPotter, Jordany Russett A

## 2013-08-26 DIAGNOSIS — I5031 Acute diastolic (congestive) heart failure: Secondary | ICD-10-CM

## 2013-08-26 DIAGNOSIS — J9610 Chronic respiratory failure, unspecified whether with hypoxia or hypercapnia: Secondary | ICD-10-CM

## 2013-08-26 DIAGNOSIS — E119 Type 2 diabetes mellitus without complications: Secondary | ICD-10-CM

## 2013-08-26 DIAGNOSIS — N183 Chronic kidney disease, stage 3 unspecified: Secondary | ICD-10-CM

## 2013-08-26 DIAGNOSIS — I4891 Unspecified atrial fibrillation: Secondary | ICD-10-CM

## 2013-08-26 DIAGNOSIS — F0390 Unspecified dementia without behavioral disturbance: Secondary | ICD-10-CM

## 2013-08-26 DIAGNOSIS — R0902 Hypoxemia: Secondary | ICD-10-CM

## 2013-08-26 LAB — LACTIC ACID, PLASMA: LACTIC ACID, VENOUS: 1.7 mmol/L (ref 0.5–2.2)

## 2013-08-26 LAB — GLUCOSE, CAPILLARY
GLUCOSE-CAPILLARY: 105 mg/dL — AB (ref 70–99)
GLUCOSE-CAPILLARY: 192 mg/dL — AB (ref 70–99)
Glucose-Capillary: 131 mg/dL — ABNORMAL HIGH (ref 70–99)
Glucose-Capillary: 208 mg/dL — ABNORMAL HIGH (ref 70–99)
Glucose-Capillary: 197 mg/dL — ABNORMAL HIGH (ref 70–99)
Glucose-Capillary: 146 mg/dL — ABNORMAL HIGH (ref 70–99)

## 2013-08-26 LAB — BASIC METABOLIC PANEL
BUN: 55 mg/dL — AB (ref 6–23)
CHLORIDE: 98 meq/L (ref 96–112)
CO2: 20 meq/L (ref 19–32)
CREATININE: 2.22 mg/dL — AB (ref 0.50–1.10)
Calcium: 8.3 mg/dL — ABNORMAL LOW (ref 8.4–10.5)
GFR calc non Af Amer: 21 mL/min — ABNORMAL LOW (ref >90)
GFR, EST AFRICAN AMERICAN: 24 mL/min — AB (ref >90)
GLUCOSE: 121 mg/dL — AB (ref 70–99)
Potassium: 5 mEq/L (ref 3.7–5.3)
Sodium: 136 mEq/L — ABNORMAL LOW (ref 137–147)

## 2013-08-26 LAB — PRO B NATRIURETIC PEPTIDE: PRO B NATRI PEPTIDE: 31681 pg/mL — AB (ref 0–125)

## 2013-08-26 LAB — URINE CULTURE
CULTURE: NO GROWTH
Colony Count: NO GROWTH

## 2013-08-26 LAB — PROTIME-INR
INR: 4.69 — ABNORMAL HIGH (ref 0.00–1.49)
Prothrombin Time: 42.3 seconds — ABNORMAL HIGH (ref 11.6–15.2)

## 2013-08-26 MED ORDER — BUDESONIDE 0.25 MG/2ML IN SUSP
0.5000 mg | Freq: Two times a day (BID) | RESPIRATORY_TRACT | Status: DC
Start: 2013-08-26 — End: 2013-08-29
  Administered 2013-08-26 – 2013-08-28 (×6): 0.5 mg via RESPIRATORY_TRACT
  Filled 2013-08-26 (×9): qty 4

## 2013-08-26 MED ORDER — QUETIAPINE FUMARATE 100 MG PO TABS
100.0000 mg | ORAL_TABLET | Freq: Every day | ORAL | Status: DC
  Administered 2013-08-26: 100 mg via ORAL
  Filled 2013-08-26 (×2): qty 1

## 2013-08-26 MED ORDER — TRAZODONE HCL 50 MG PO TABS
50.0000 mg | ORAL_TABLET | Freq: Every day | ORAL | Status: DC
  Administered 2013-08-26: 50 mg via ORAL
  Filled 2013-08-26 (×2): qty 1

## 2013-08-26 MED ORDER — FAMOTIDINE 10 MG PO TABS
10.0000 mg | ORAL_TABLET | Freq: Two times a day (BID) | ORAL | Status: DC
  Administered 2013-08-26 – 2013-08-27 (×2): 10 mg via ORAL
  Filled 2013-08-26 (×3): qty 1

## 2013-08-26 MED ORDER — ARFORMOTEROL TARTRATE 15 MCG/2ML IN NEBU
15.0000 ug | INHALATION_SOLUTION | Freq: Two times a day (BID) | RESPIRATORY_TRACT | Status: DC
  Administered 2013-08-26 – 2013-08-28 (×6): 15 ug via RESPIRATORY_TRACT
  Filled 2013-08-26 (×9): qty 2

## 2013-08-26 MED ORDER — SODIUM CHLORIDE 0.9 % IV SOLN
INTRAVENOUS | Status: DC
  Administered 2013-08-26 – 2013-08-27 (×2): via INTRAVENOUS

## 2013-08-26 MED ORDER — FUROSEMIDE 10 MG/ML IJ SOLN
40.0000 mg | Freq: Four times a day (QID) | INTRAMUSCULAR | Status: AC
  Administered 2013-08-26: 40 mg via INTRAVENOUS
  Filled 2013-08-26: qty 4

## 2013-08-26 MED ORDER — DOXYCYCLINE HYCLATE 100 MG PO TABS
100.0000 mg | ORAL_TABLET | Freq: Two times a day (BID) | ORAL | Status: DC
  Administered 2013-08-26 – 2013-08-27 (×3): 100 mg via ORAL
  Filled 2013-08-26 (×4): qty 1

## 2013-08-26 NOTE — Consult Note (Signed)
PULMONARY / CRITICAL CARE MEDICINE   Name: Ariana AmatoShirley [name] MRN: [id number] DOB: February 01, 1941    ADMISSION DATE:  08/23/2013 CONSULTATION DATE:  08/13/2013  REFERRING MD :  Eloise HarmanPaterson PRIMARY SERVICE: Internal Med  CHIEF COMPLAINT:  sepsis  BRIEF PATIENT DESCRIPTION: [name] presented 3/30 with a 2 day history of progressive dyspnea, hypoxia & fever. Was hypotensive with some response to fluids, lactate 1.7  and admitted for what was thought to be severe sepsis. Early morning 3/31 remained hypotensive and PCCM was asked to see.    SIGNIFICANT EVENTS / STUDIES:  3/30 - Admitted  LINES / TUBES: PIV  CULTURES: Blood 3/30>>> Urine 3/30 >>>  ANTIBIOTICS: Cefepime 3/30 >>> 4/1 Vancomycin 3/30 >>> 4/1 Doxycycline 4/1 >>    SUBJECTIVE:   VITAL SIGNS: Temp:  [97.4 F (36.3 C)-99.2 F (37.3 C)] 98.3 F (36.8 C) (04/01 0815) Pulse Rate:  [68-125] 89 (04/01 0800) Resp:  [12-27] 24 (04/01 0800) BP: (69-136)/(45-102) 107/90 mmHg (04/01 0800) SpO2:  [89 %-100 %] 89 % (04/01 0800) FiO2 (%):  [50 %] 50 % (03/31 2013) HEMODYNAMICS:   VENTILATOR SETTINGS: Vent Mode:  [-]  FiO2 (%):  [50 %] 50 % INTAKE / OUTPUT: Intake/Output     03/31 0701 - 04/01 0700 04/01 0701 - 04/02 0700   P.O. 1120    I.V. (mL/kg) 260 (3.2) 10 (0.1)   IV Piggyback 200    Total Intake(mL/kg) 1580 (19.4) 10 (0.1)   Urine (mL/kg/hr) 695 (0.4) 20 (0.1)   Total Output 695 20   Net +885 -10        Stool Occurrence 2 x      PHYSICAL EXAMINATION: General:  Chronically ill appearing, no distress HEENT: NCAT, EOMi PULM; wheezing and rhonchi bilaterally CV: Irreg irreg, no gallop AB: BS+, soft, nontende Ext: warm, edema noted Neuro: Awake, oriented to place, year, situation  LABS:  CBC  Recent Labs Lab 08/05/2013 0945 08/23/2013 1001 08/25/13 0525  WBC 13.3*  --  10.7*  HGB 8.9* 9.9* 8.1*  HCT 29.5* 29.0* 27.6*  PLT PLATELET CLUMPS NOTED ON SMEAR, COUNT APPEARS ADEQUATE  --  201    Coag's  Recent Labs Lab 08/18/2013 0945 08/25/13 0525 08/26/13 0220  INR 2.14* 2.86* 4.69*   BMET  Recent Labs Lab 08/23/2013 0945 08/07/2013 1001 08/25/13 0525 08/26/13 0220  NA 143 142 140 136*  K 3.9 3.7 4.2 5.0  CL 103 106 104 98  CO2 24  --  21 20  BUN 45* 43* 45* 55*  CREATININE 1.84* 2.20* 1.75* 2.22*  GLUCOSE 130* 133* 196* 121*   Electrolytes  Recent Labs Lab 08/06/2013 0945 08/25/13 0525 08/26/13 0220  CALCIUM 8.7 7.9* 8.3*  MG  --  1.9  --   PHOS  --  5.2*  --    Sepsis Markers  Recent Labs Lab 08/11/2013 0958 08/25/13 0525 08/25/13 1108  LATICACIDVEN 1.68  --  4.2*  PROCALCITON  --  0.32  --    ABG  Recent Labs Lab 08/01/2013 1045  PHART 7.436  PCO2ART 36.3  PO2ART 91.0   Liver Enzymes  Recent Labs Lab 07/31/2013 0945 08/25/13 0525  AST 27 23  ALT 24 19  ALKPHOS 61 51  BILITOT <0.2* <0.2*  ALBUMIN 2.2* 1.8*   Cardiac Enzymes  Recent Labs Lab 08/25/2013 0945  08/25/13 0525 08/25/13 1255 08/25/13 2211 08/26/13 0220  TROPONINI  --   < > 0.49* 0.40* 0.38*  --   PROBNP 37282.0*  --  35647.0*  --   --  31681.0*  < > = values in this interval not displayed. Glucose  Recent Labs Lab 08/25/13 0925 08/25/13 1151 08/25/13 1626 08/25/13 1923 08/25/13 2343 08/26/13 0404  GLUCAP 196* 241* 201* 229* 113* 131*    Imaging  CXR 3/31 > pulmonary edema with bilateral effusions  ASSESSMENT / PLAN:  PULMONARY A: Acute respiratory failure due to Pulmonary edema +/- COPD exacerbation H/o 'COPD' -never smoker Pleural effusions - transudative 05/2013 P:   Supplemental O2 as needed, Goal SpO2 > 92% Follow CXR Diurese No thoracentesis with elevated INR Add pulmicort    CARDIOVASCULAR A:  Hypotension, lactic acidosis > felt due to relative AI and med related Acute on Chronic diastolic CHF Afib P:  Repeat lactic acid Diurese Wean stress dose steroids Out of bed Agree with cardiology consult . RENAL A:   CKD (baseline creat  1.8)  P:   Hold IVF Monitor BMP  GASTROINTESTINAL A:   No acute issue  P:   Supportive Care  HEMATOLOGIC A:   Anemia ? Chronic disease  P:  Monitor CBC Transfuse for only Hb <7 Not a good candidate for long term coumadin  INFECTIOUS A:   Possibly AE COPD but does not appear septic   P:   D/C IV antibiotics Use doxycycline for AE COPD  ENDOCRINE A:   Presumed AI due to recent steroid use for presumed COPD exacerbation DM P:   Continue stress dose steroids. Check CBG, SSI Levemir  NEUROLOGIC A:  Acute encephalopathy  - likely secondary to sedating PM medications.  P:   Hold benzo Restart seroquel and trazodone to re-establish sleep wake cycle   Yolonda Kida PCCM Pager: [phone] Cell: [phone] If no response, call [phone]

## 2013-08-26 NOTE — Progress Notes (Signed)
ANTICOAGULATION CONSULT NOTE - Follow Up Consult  Pharmacy Consult for coumadin Indication: atrial fibrillation  Allergies  Allergen Reactions  . Baclofen Other (See Comments)    Reaction unknown  . Clindamycin/Lincomycin Other (See Comments)    Reaction unknown    Patient Measurements: Height: 5\' 2"  (157.5 cm) Weight: 179 lb 10.8 oz (81.5 kg) IBW/kg (Calculated) : 50.1   Vital Signs: Temp: 98.3 F (36.8 C) (04/01 0815) Temp src: Oral (04/01 0815) BP: 107/90 mmHg (04/01 0800) Pulse Rate: 89 (04/01 0800)  Labs:  Recent Labs  08/13/2013 0945 07/31/2013 1001  08/25/13 0525 08/25/13 1255 08/25/13 2211 08/26/13 0220  HGB 8.9* 9.9*  --  8.1*  --   --   --   HCT 29.5* 29.0*  --  27.6*  --   --   --   PLT PLATELET CLUMPS NOTED ON SMEAR, COUNT APPEARS ADEQUATE  --   --  201  --   --   --   LABPROT 23.2*  --   --  29.0*  --   --  42.3*  INR 2.14*  --   --  2.86*  --   --  4.69*  CREATININE 1.84* 2.20*  --  1.75*  --   --  2.22*  TROPONINI  --   --   < > 0.49* 0.40* 0.38*  --   < > = values in this interval not displayed.  Estimated Creatinine Clearance: 23 ml/min (by C-G formula based on Cr of 2.22).  Assessment: Patient is a 73 y.o F on coumadin for Afib.  INR increased sharply from 2.86 to 4.69 with dose given yesterday.  No bleeding documented.  Goal of Therapy:  INR 2-3    Plan:  1) hold coumadin today  Jatavion Peaster P 08/26/2013,8:30 AM

## 2013-08-26 NOTE — Progress Notes (Signed)
Subjective: Has dyspnea with any conversation with pulse oxygen sat drops to the 80s with talking.  Objective: Vital signs in last 24 hours: Temp:  [97.4 F (36.3 C)-99.2 F (37.3 C)] 98.4 F (36.9 C) (04/01 0400) Pulse Rate:  [68-125] 89 (04/01 0700) Resp:  [12-27] 20 (04/01 0700) BP: (69-136)/(45-102) 105/63 mmHg (04/01 0700) SpO2:  [93 %-100 %] 93 % (04/01 0713) FiO2 (%):  [50 %] 50 % (03/31 2013) Weight:  [81.5 kg (179 lb 10.8 oz)] 81.5 kg (179 lb 10.8 oz) (03/31 1000) Weight change: 0 kg (0 lb)   Intake/Output from previous day: 03/31 0701 - 04/01 0700 In: 1570 [P.O.:1120; I.V.:250; IV Piggyback:200] Out: 675 [Urine:675]   General appearance: alert, cooperative and mild distress Resp: bilateral scattered wheezing Cardio: regular rate and rhythm and irregularly irregular rhythm GI: soft, non-tender; bowel sounds normal; no masses,  no organomegaly Extremities: bilateral 1+ leg edema  Lab Results:  Recent Labs  09/08/13 0945 09-08-13 1001 08/25/13 0525  WBC 13.3*  --  10.7*  HGB 8.9* 9.9* 8.1*  HCT 29.5* 29.0* 27.6*  PLT PLATELET CLUMPS NOTED ON SMEAR, COUNT APPEARS ADEQUATE  --  201   BMET  Recent Labs  08/25/13 0525 08/26/13 0220  NA 140 136*  K 4.2 5.0  CL 104 98  CO2 21 20  GLUCOSE 196* 121*  BUN 45* 55*  CREATININE 1.75* 2.22*  CALCIUM 7.9* 8.3*   CMET CMP     Component Value Date/Time   NA 136* 08/26/2013 0220   K 5.0 08/26/2013 0220   CL 98 08/26/2013 0220   CO2 20 08/26/2013 0220   GLUCOSE 121* 08/26/2013 0220   BUN 55* 08/26/2013 0220   CREATININE 2.22* 08/26/2013 0220   CALCIUM 8.3* 08/26/2013 0220   PROT 5.0* 08/25/2013 0525   ALBUMIN 1.8* 08/25/2013 0525   AST 23 08/25/2013 0525   ALT 19 08/25/2013 0525   ALKPHOS 51 08/25/2013 0525   BILITOT <0.2* 08/25/2013 0525   GFRNONAA 21* 08/26/2013 0220   GFRAA 24* 08/26/2013 0220    CBG (last 3)   Recent Labs  08/25/13 1923 08/25/13 2343 08/26/13 0404  GLUCAP 229* 113* 131*    INR RESULTS:   Lab  Results  Component Value Date   INR 4.69* 08/26/2013   INR 2.86* 08/25/2013   INR 2.14* 09/08/2013     Studies/Results: [place] Chest  08/25/2013   CLINICAL DATA:  Evaluate for pleural effusion.  EXAM: CHEST ULTRASOUND  COMPARISON:  08/17/2013  FINDINGS: Bilateral pleural effusions without visible septation or debris. The effusion on the right is moderate volume when correlated with chest x-ray. The effusion on the left is small volume. No visible loculation. Extensive atelectasis of the right lower lobe.  IMPRESSION: Moderate right and small left pleural effusion. No visible septation or other complex feature.   Electronically Signed   By: Tiburcio Pea M.D.   On: 08/25/2013 06:57   Dg Chest Port 1 View  08/25/2013   CLINICAL DATA:  Hypoxemia.  EXAM: PORTABLE CHEST - 1 VIEW  COMPARISON:  DG CHEST 1V PORT dated 2013/09/08  FINDINGS: The cardiac silhouette remains moderate to severely enlarged, mediastinal silhouette is nonsuspicious, moderately calcified aortic knob. Slightly increasing moderate interstitial prominence with bibasilar patchy airspace opacities and small to moderate right, small left pleural effusion. No pneumothorax.  Vascular calcifications in the upper extremities. Osseous structures are nonsuspicious ; left posterior lateral remote rib fractures. Lumbar instrumentation with thoracic scoliosis.  IMPRESSION: Stable cardiomegaly with apparent worsening  interstitial edema. Bibasilar airspace opacities likely reflect atelectasis and/or confluent edema. Shifting pleural effusions, small to moderate on the right, small left.   Electronically Signed   By: Awilda Metroourtnay  Bloomer   On: 08/25/2013 05:23   Dg Chest Portable 1 View  08/21/2013   CLINICAL DATA:  Shortness of breath, weakness, sepsis  EXAM: PORTABLE CHEST - 1 VIEW  COMPARISON:  08/17/2013  FINDINGS: Cardiomegaly again noted. Central vascular congestion without pulmonary edema. Small to moderate right pleural effusion. Small left pleural  effusion. Bilateral basilar atelectasis or infiltrate.  IMPRESSION: Central vascular congestion without pulmonary edema. Small to moderate right pleural effusion with right basilar atelectasis or infiltrate. Small left pleural effusion with left basilar atelectasis or infiltrate.   Electronically Signed   By: Natasha MeadLiviu  Pop M.D.   On: 08/09/2013 09:46   Dg Swallowing Func-speech Pathology  08/25/2013   Ariana [name]     08/25/2013  2:14 PM Objective Swallowing Evaluation: Modified Barium Swallowing Study   Patient Details  Name: Ariana [name]: [id number] Date of Birth: 01-Apr-1941  Today's Date: 08/25/2013 Time: [id number] SLP Time Calculation (min): 24 min  Past Medical History:  Past Medical History  Diagnosis Date  . Pneumonia   . Pleural effusion   . CHF (congestive heart failure)   . Paroxysmal atrial fibrillation   . COPD (chronic obstructive pulmonary disease)   . Stroke   . Dysarthria due to cerebrovascular accident   . Hemiparesis affecting right side as late effect of  cerebrovascular accident   . Coronary artery disease   . Diabetes mellitus without complication   . GERD (gastroesophageal reflux disease)   . Dementia    Past Surgical History:  Past Surgical History  Procedure Laterality Date  . Thoracostomy    . Thoracentesis    . Breast surgery     HPI:  [name] presented 3/30 with a 2 day history of  progressive dyspnea, hypoxia & fever. Was hypotensive with some  response to fluids, lactate 1.7 and admitted for what was thought  to be severe sepsis. Pt with a history of CVA, dysarthria. Has  been seen by SLp for similar admissions, basic respiratory and  aspriation precautiosn suggested. No MBS on file.      Assessment / Plan / Recommendation Clinical Impression  Dysphagia Diagnosis: Mild pharyngeal phase dysphagia Clinical impression: Pt presents wia mild to moderate  oropharyngeal dysphagia with incomplete airway closure during the  swallow, leading to consistent deep flash  penetration of thin  liquids. Occasional aspiration or penetration events likely with  this presentation. Suspect airway closure is reduced due to high  respiratory need and pt struggle to keep airway closed for brief  apneic period during swallowing. SLP instructed pt in a chin  tuck, with a straw to ease of positioning, which reduced depth of  penetration, but did not prevent it completely. Hopefully this  strategy will reduce risk of possible aspiration events.  Recommend pt conitnue a dys 3 (mech soft) diet and thin liqudis,  but with a chin tuck. SLP will follow for carry over.     Treatment Recommendation  Therapy as outlined in treatment plan below    Diet Recommendation Dysphagia 3 (Mechanical Soft);Thin liquid   Liquid Administration via: Straw Medication Administration: Whole meds with puree Supervision: Patient able to self feed;Intermittent supervision  to cue for compensatory strategies Compensations: Slow rate;Small sips/bites Postural Changes and/or Swallow Maneuvers: Chin tuck;Seated  upright 90 degrees    Other  Recommendations Oral Care Recommendations: Oral care BID   Follow Up Recommendations  Skilled Nursing facility    Frequency and Duration min 2x/week  2 weeks   Pertinent Vitals/Pain NA    SLP Swallow Goals     General HPI: [name] presented 3/30 with a 2 day history  of progressive dyspnea, hypoxia & fever. Was hypotensive with  some response to fluids, lactate 1.7 and admitted for what was  thought to be severe sepsis. Pt with a history of CVA,  dysarthria. Has been seen by SLp for similar admissions, basic  respiratory and aspriation precautiosn suggested. No MBS on file.   Type of Study: Modified Barium Swallowing Study Reason for Referral: Objectively evaluate swallowing function Diet Prior to this Study: Dysphagia 3 (soft);Thin liquids Temperature Spikes Noted: No Respiratory Status: Nasal cannula History of Recent Intubation: No Behavior/Cognition: Alert;Cooperative;Pleasant  mood Oral Cavity - Dentition: Edentulous;Dentures, not available Oral Motor / Sensory Function: Impaired - see Bedside swallow  eval Self-Feeding Abilities: Able to feed self Patient Positioning: Upright in chair Baseline Vocal Quality: Low vocal intensity;Hoarse Volitional Cough: Congested Volitional Swallow: Able to elicit Anatomy: Within functional limits Pharyngeal Secretions: Not observed secondary MBS    Reason for Referral Objectively evaluate swallowing function   Oral Phase Oral Preparation/Oral Phase Oral Phase: Impaired Oral - Thin Oral - Thin Cup: Within functional limits Oral - Thin Straw: Within functional limits Oral - Solids Oral - Puree: Within functional limits Oral - Mechanical Soft: Impaired mastication;Weak lingual  manipulation;Pocketing in anterior sulcus;Right pocketing in  lateral sulci;Delayed oral transit (pt cleared independently)   Pharyngeal Phase Pharyngeal Phase Pharyngeal Phase: Impaired Pharyngeal - Thin Pharyngeal - Thin Cup: Reduced airway/laryngeal  closure;Penetration/Aspiration during swallow Penetration/Aspiration details (thin cup): Material enters  airway, CONTACTS cords then ejected out Pharyngeal - Thin Straw: Reduced airway/laryngeal  closure;Penetration/Aspiration during swallow Penetration/Aspiration details (thin straw): Material enters  airway, CONTACTS cords then ejected out Pharyngeal - Solids Pharyngeal - Puree: Delayed swallow initiation;Premature spillage  to valleculae Pharyngeal - Mechanical Soft: Delayed swallow  initiation;Premature spillage to valleculae Pharyngeal - Pill: Delayed swallow initiation  Cervical Esophageal Phase    GO             Harlon Ditty, MA [name] [phone]  [name], Ariana [name] 08/25/2013, 2:13 PM     Medications: I have reviewed the patient's current medications.  Assessment/Plan: #1 Dyspnea: appears to be due to diastolic dysfunction from Afib with LVH. Difficult to diurese her further as she becomes hypotensive with  increased creatinine. Will request cardiologist evaluation for the possibility of cardioverting her. #2 Hyperkalemia: mild and will discontinue aldactone and modify IVF.   LOS: 2 days   Ariana [name] 08/26/2013, 8:11 AM

## 2013-08-26 NOTE — Clinical Social Work Note (Signed)
CSW attempting to contact patient's son as patient is from [place]. CSW will follow-up with d/c planning needs tomorrow.  Danicka Hourihan Patrick-Jefferson, LCSWA Weekend Clinical Social Worker [phone]

## 2013-08-26 DEATH — deceased

## 2013-08-27 ENCOUNTER — Inpatient Hospital Stay (HOSPITAL_COMMUNITY): Payer: Medicare Other

## 2013-08-27 DIAGNOSIS — D649 Anemia, unspecified: Secondary | ICD-10-CM

## 2013-08-27 LAB — GLUCOSE, CAPILLARY
GLUCOSE-CAPILLARY: 115 mg/dL — AB (ref 70–99)
GLUCOSE-CAPILLARY: 78 mg/dL (ref 70–99)
GLUCOSE-CAPILLARY: 159 mg/dL — AB (ref 70–99)
Glucose-Capillary: 102 mg/dL — ABNORMAL HIGH (ref 70–99)
Glucose-Capillary: 147 mg/dL — ABNORMAL HIGH (ref 70–99)

## 2013-08-27 LAB — INFLUENZA PANEL BY PCR (TYPE A & B)
H1N1 flu by pcr: NOT DETECTED
INFLAPCR: NEGATIVE
INFLBPCR: NEGATIVE

## 2013-08-27 MED ORDER — MORPHINE SULFATE 2 MG/ML IJ SOLN
2.0000 mg | INTRAMUSCULAR | Status: DC | PRN
  Administered 2013-08-27 (×4): 2 mg via INTRAVENOUS
  Filled 2013-08-27: qty 2
  Filled 2013-08-27 (×3): qty 1

## 2013-08-27 MED ORDER — AMIODARONE HCL IN DEXTROSE 360-4.14 MG/200ML-% IV SOLN: 60.0000 mg/h | INTRAVENOUS | Status: DC

## 2013-08-27 MED ORDER — SODIUM CHLORIDE 0.9 % IV SOLN
1.0000 mg/h | INTRAVENOUS | Status: DC
  Administered 2013-08-27: 1 mg/h via INTRAVENOUS
  Administered 2013-08-28: 4 mg/h via INTRAVENOUS
  Filled 2013-08-27: qty 10

## 2013-08-27 MED ORDER — MORPHINE SULFATE 2 MG/ML IJ SOLN
2.0000 mg | Freq: Once | INTRAMUSCULAR | Status: AC
  Administered 2013-08-27: 2 mg via INTRAVENOUS

## 2013-08-27 MED ORDER — MORPHINE SULFATE 2 MG/ML IJ SOLN
INTRAMUSCULAR | Status: AC
  Filled 2013-08-27: qty 1

## 2013-08-27 MED ORDER — AMIODARONE HCL IN DEXTROSE 360-4.14 MG/200ML-% IV SOLN: 30.0000 mg/h | INTRAVENOUS | Status: DC

## 2013-08-27 MED ORDER — MORPHINE SULFATE 2 MG/ML IJ SOLN
2.0000 mg | INTRAMUSCULAR | Status: DC | PRN
  Administered 2013-08-27: 2 mg via INTRAVENOUS
  Filled 2013-08-27: qty 1

## 2013-08-27 MED ORDER — FUROSEMIDE 10 MG/ML IJ SOLN
80.0000 mg | Freq: Four times a day (QID) | INTRAMUSCULAR | Status: AC
  Administered 2013-08-27 (×3): 80 mg via INTRAVENOUS
  Filled 2013-08-27 (×3): qty 8

## 2013-08-27 NOTE — Progress Notes (Signed)
Meredith Pelalled ELINK MD about patient's WOB increasing, patient was in pain, groaning/moaning, and increased use of accessory muscle.  MD will put in orders. Will monitor

## 2013-08-27 NOTE — Progress Notes (Addendum)
[facility] PCCM  I just had a lengthy conversation with the Sisney family (son Tomie ChinaZane and daughter in law).    They state her quality of life is poor and they understand the severity of this illness.  We discussed procedding with more aggressive measures (central lines, etc) and the patient actually spoke up in the conversation and said, "No".    So from here on we will continue lasix and morphine alone.  No BIPAP, no more labs  Will use lasix to help relieve shortness of breath.  If she worsens, will start a morphine drip.  Meds changed to reflect only those to relief anxiety and provide comfort.  Updated primary service, will leave it up to them to consult palliative care.  PCCM to sign off  Yolonda KidaMCQUAID, Zyier Dykema Erin PCCM Pager: [phone] Cell: [phone] If no response, call [phone]

## 2013-08-27 NOTE — Progress Notes (Signed)
Pt continues to have labored breathing.  Asked pt if she feel like she is having a harder time breathing and pt stated she was not.  Asked pt if she felt like she had congestion in her chest she felt like she needed to clear and pt stated yes.  I had pt attempt to cough to clear congestion.  Pt cough remains weak and non-productive.  Explained to pt NTS procedure and asked pt if this was something she would like me to perform in order to clear the congestion.  Pt refused.  Pt remains on 55% venti mask with saturation currently 93%.  Will continue to monitor.

## 2013-08-27 NOTE — Progress Notes (Signed)
eLink Physician-Brief Progress Note Patient Name: Ariana AmatoShirley [name] DOB: 1941/04/11 MRN: [id number]  Date of Service  08/27/2013   HPI/Events of Note   Progressive resp distress this evening. Now with some confusion. Note DNR status. She has been diuresed to point of marginal BP.   eICU Interventions  - single low dose morphine to see if this helps w symptoms.  - may have to broach subject of a transition to comfort depending on her progress   Intervention Category Intermediate Interventions: Respiratory distress - evaluation and management  Ariana [name]. 08/27/2013, 2:45 AM

## 2013-08-27 NOTE — Progress Notes (Signed)
Called son, updated him on current patient situation, got verbal consent for PICC Line, son stated he can meet MD at 12 noon. Paged MD to inform and per MD will leave patient in ICU for now, will reevaluate this afternoon.

## 2013-08-27 NOTE — Progress Notes (Signed)
Physical Therapy Discharge Patient Details Name: Ariana AmatoShirley [name] MRN: [id number] DOB: 05-May-1941 Today's Date: 08/27/2013 Time:  -     Patient discharged from PT services secondary to Pt and family have decided to proceed toward comfort care only at this time..   08/27/2013  [place] BingKen Juliano Mceachin, PT [phone]  (pager)  GP     [name], Eliseo GumKenneth V 08/27/2013, 1:28 PM

## 2013-08-27 NOTE — Progress Notes (Signed)
Assessed left arm (right arm affected by CVA) and veins too small per our policy.   PICC would occupy 92% of basilic vein.   Cephalic and brachial veins were smaller.    Primary RN made aware.

## 2013-08-27 NOTE — Progress Notes (Signed)
Pt continues with labored breathing.  Pt appeared to be trying to get up.  This RN to pt room.  After much conversation and trying to determine pt needs, pt placed on bedpan per her request.  This RN to pt room to assist pt with getting off bedpan.  Pt aggressive and physical towards this RN.  Carollee HerterShannon, RN to bedside to assist.  RNs able to get pt off bedpan but pt remained agitated, work of breathing continued to worsen.  Elink RN Nikki cameraed in room to ask if assistance was needed.  Alda Leadvised Nikki of current situation.  Nikki updated Dr. Delton CoombesByrum.  Received order for one time dose of 2mg  IV Morphine.  Lab to bedside to attempt to draw labs.  Pt refused at first but then allowed lab tech to attempt.  Lab tech unable to draw blood.  Dr. Delton CoombesByrum made aware that labs not able to be collected.  Will implement orders and continue to closely monitor pt.

## 2013-08-27 NOTE — Progress Notes (Signed)
Patient and family tx to 6 E8, report given RN, during report informed nurse that PCCM has signed off and should the need arise, RN to contact to Dr. Eloise HarmanPaterson (ATTENDING/ADMITTING) MD for orders.  Patient was and is comfort care only.  Emotional support given to patient and family during entire shift.

## 2013-08-27 NOTE — Progress Notes (Signed)
eLink Physician-Brief Progress Note Patient Name: Ariana AmatoShirley Fruth DOB: Jan 21, 1941 MRN: [id number]  Date of Service  08/27/2013   HPI/Events of Note   resp distress, needs more morphine  eICU Interventions  Increase frequency to q1h May need drip   Intervention Category Major Interventions: Respiratory failure - evaluation and management Minor Interventions: Agitation / anxiety - evaluation and management  MCQUAID, DOUGLAS 08/27/2013, 6:03 PM

## 2013-08-27 NOTE — Progress Notes (Signed)
eLink Physician-Brief Progress Note Patient Name: Ariana AmatoShirley [name] DOB: 28-Nov-1940 MRN: [id number]  Date of Service  08/27/2013   HPI/Events of Note   Worsening dyspnea, discomfort I have had lengthy conversations with family and patient, goals here are for comfort only.  eICU Interventions  Change to morphine gtt, can be titrated to 10mg /hr if needed Full comfort measures   Intervention Category Major Interventions: Respiratory failure - evaluation and management  Maleyah Evans 08/27/2013, 7:13 PM

## 2013-08-27 NOTE — Progress Notes (Signed)
Pt continuing to have labored breathing.  Pt remains on 55% Venti mask with O2 sats now 85% sustaining.  Changed pt to 100% NRB.  Pt now has O2 sats of 100%.  Will continue to monitor.

## 2013-08-27 NOTE — Progress Notes (Signed)
Received orders for morphine gtt, patient will be tx to 6E08 per MD order, MD aware.

## 2013-08-27 NOTE — Progress Notes (Signed)
Assessed pt.  Pt work of breathing is labored.  Pt is diaphoretic.  Per report from day nurse, pt work of breathing has increased throughout day.  Pt has transitioned from [facility]5LNC to Venti mask 55% throughout the day.  Audible congestion/rhonci/wheezes heard from pt.  Pt using accessory/abdominal muscles to breath.  Called eLink and spoke with Dr. Delton CoombesByrum.  Discussed care with him.  Pt is DNR.  Plan to continue to monitor, administer PRN nebulizer as needed and NTS if pt allows.  Dr. Delton CoombesByrum advised he would continue to monitor pt closely from his end as well.  Spoke with Selena BattenKim, RT and she advised pt was given scheduled nebulizer with no change in pt work of breathing or breath sounds.  This RN worked with pt on deep breathing and coughing.  Pt has a weak, non-productive cough.  Pt remains on 55% venti mask with saturations currently of 93%.  Will continue to monitor closely.

## 2013-08-27 NOTE — Progress Notes (Signed)
PULMONARY / CRITICAL CARE MEDICINE   Name: Ariana AmatoShirley [name] MRN: [id number] DOB: 01/12/1941    ADMISSION DATE:  07/27/2013 CONSULTATION DATE:  07/28/2013  REFERRING MD :  Eloise HarmanPaterson PRIMARY SERVICE: Internal Med  CHIEF COMPLAINT:  sepsis  BRIEF PATIENT DESCRIPTION: [name] presented 3/30 with a 2 day history of progressive dyspnea, hypoxia & fever. Was hypotensive with some response to fluids, lactate 1.7  and admitted for what was thought to be severe sepsis. Early morning 3/31 remained hypotensive and PCCM was asked to see.    SIGNIFICANT EVENTS / STUDIES:  3/30 - Admitted  LINES / TUBES: PIV  CULTURES: Blood 3/30>>> Urine 3/30 >>>  ANTIBIOTICS: Cefepime 3/30 >>> 4/1 Vancomycin 3/30 >>> 4/1 Doxycycline 4/1 >>    SUBJECTIVE:  Worsening dyspnea overnight 100% NRB to maintain O2 saturation Morphine for dyspnea Intermittently combative  VITAL SIGNS: Temp:  [97.6 F (36.4 C)-99.1 F (37.3 C)] 97.6 F (36.4 C) (04/02 0736) Pulse Rate:  [51-100] 98 (04/02 0745) Resp:  [18-33] 19 (04/02 0745) BP: (83-145)/(55-98) 122/83 mmHg (04/02 0745) SpO2:  [78 %-99 %] 96 % (04/02 0745) FiO2 (%):  [55 %-100 %] 100 % (04/02 0722) HEMODYNAMICS:   VENTILATOR SETTINGS: Vent Mode:  [-]  FiO2 (%):  [55 %-100 %] 100 % INTAKE / OUTPUT: Intake/Output     04/01 0701 - 04/02 0700 04/02 0701 - 04/03 0700   P.O. 400    I.V. (mL/kg) 233 (2.9)    IV Piggyback     Total Intake(mL/kg) 633 (7.8)    Urine (mL/kg/hr) 1235 (0.6)    Total Output 1235     Net -602            PHYSICAL EXAMINATION: General:  Acute respiratory distress HEENT: NCAT, EOMi PULM; wheezing and rhonchi bilaterally CV: Irreg irreg, no gallop AB: BS+, soft, nontende Ext: warm, edema noted Neuro: Conversant, anxious  LABS:  CBC  Recent Labs Lab 07/30/2013 0945 07/29/2013 1001 08/25/13 0525  WBC 13.3*  --  10.7*  HGB 8.9* 9.9* 8.1*  HCT 29.5* 29.0* 27.6*  PLT PLATELET CLUMPS NOTED ON SMEAR, COUNT  APPEARS ADEQUATE  --  201   Coag's  Recent Labs Lab 08/13/2013 0945 08/25/13 0525 08/26/13 0220  INR 2.14* 2.86* 4.69*   BMET  Recent Labs Lab 08/07/2013 0945 08/14/2013 1001 08/25/13 0525 08/26/13 0220  NA 143 142 140 136*  K 3.9 3.7 4.2 5.0  CL 103 106 104 98  CO2 24  --  21 20  BUN 45* 43* 45* 55*  CREATININE 1.84* 2.20* 1.75* 2.22*  GLUCOSE 130* 133* 196* 121*   Electrolytes  Recent Labs Lab 08/05/2013 0945 08/25/13 0525 08/26/13 0220  CALCIUM 8.7 7.9* 8.3*  MG  --  1.9  --   PHOS  --  5.2*  --    Sepsis Markers  Recent Labs Lab 08/23/2013 0958 08/25/13 0525 08/25/13 1108 08/26/13 1300  LATICACIDVEN 1.68  --  4.2* 1.7  PROCALCITON  --  0.32  --   --    ABG  Recent Labs Lab 07/31/2013 1045  PHART 7.436  PCO2ART 36.3  PO2ART 91.0   Liver Enzymes  Recent Labs Lab 08/25/2013 0945 08/25/13 0525  AST 27 23  ALT 24 19  ALKPHOS 61 51  BILITOT <0.2* <0.2*  ALBUMIN 2.2* 1.8*   Cardiac Enzymes  Recent Labs Lab 08/12/2013 0945  08/25/13 0525 08/25/13 1255 08/25/13 2211 08/26/13 0220  TROPONINI  --   < > 0.49* 0.40* 0.38*  --  PROBNP 37282.0*  --  35647.0*  --   --  31681.0*  < > = values in this interval not displayed. Glucose  Recent Labs Lab 08/26/13 1223 08/26/13 1537 08/26/13 1924 08/26/13 2329 08/27/13 0327 08/27/13 0734  GLUCAP 192* 208* 197* 146* 115* 78    Imaging  CXR 3/31 > pulmonary edema with bilateral effusions  ASSESSMENT / PLAN:  PULMONARY A: Acute respiratory failure: primarily due to pulmonary edema COPD, perhaps exacerbation but more likely Acute CHF Transudative pleural effusion due to pulmonary edema P:   -CXR now -BIPAP now -diurese now -morphine now -goals of care conversation with family ASAP -    CARDIOVASCULAR A:  Hypotension due to new Medications and adrenal insufficiency, lactic acidosis > resolved Acute on Chronic diastolic CHF Afib chronic > unlikely to revert to sinus rhythm P:   -amiodarone per primary service -no role for pressors -cardiology consult?  RENAL A:   CKD (baseline creat 1.8)  P:   -Hold IVF -Monitor BMP  GASTROINTESTINAL A:   No acute issue  P:   -NPO on BIPAP -Advance diet when able to take PO  HEMATOLOGIC A:   Anemia of chronic disease without bleeding  P:  -Monitor CBC -Transfuse for only Hb <7 -Not a good candidate for long term coumadin  INFECTIOUS A:   Possibly AE COPD but does not appear septic   P:   -Continue doxycycline for AE COPD  ENDOCRINE A:   Presumed AI due to recent steroid use for presumed COPD exacerbation DM P:   -Continue stress dose steroids, would wean 4/3 -Check CBG, SSI -Levemir  NEUROLOGIC A:  Acute encephalopathy  - likely secondary to sedating PM medications.  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  Will discuss goals of care with family today to establish limits in care.  May need to move towards full comfort measures if no improvement with diuresis today.  Critical Care time 35 minutes.  Yolonda Kida PCCM Pager: [phone] Cell: [phone] If no response, call [phone]

## 2013-08-27 NOTE — Progress Notes (Signed)
Subjective: Events of last night noted and she is now on CPAP with 100 % oxygen to maintain sats.  Objective: Vital signs in last 24 hours: Temp:  [97.6 F (36.4 C)-99.1 F (37.3 C)] 97.6 F (36.4 C) (04/02 0736) Pulse Rate:  [51-100] 98 (04/02 0745) Resp:  [18-33] 19 (04/02 0745) BP: (83-145)/(55-98) 122/83 mmHg (04/02 0745) SpO2:  [78 %-99 %] 96 % (04/02 0745) FiO2 (%):  [55 %-100 %] 100 % (04/02 0722) Weight change:    Intake/Output from previous day: 04/01 0701 - 04/02 0700 In: 633 [P.O.:400; I.V.:233] Out: 1235 [Urine:1235]   General appearance: fatigued and moderate distress Resp: bilateral diffuse rhonchi Cardio: regular rate and rhythm Extremities: no significant peripheral edema  Lab Results:  Recent Labs  December 15, 2013 0945 December 15, 2013 1001 08/25/13 0525  WBC 13.3*  --  10.7*  HGB 8.9* 9.9* 8.1*  HCT 29.5* 29.0* 27.6*  PLT PLATELET CLUMPS NOTED ON SMEAR, COUNT APPEARS ADEQUATE  --  201   BMET  Recent Labs  08/25/13 0525 08/26/13 0220  NA 140 136*  K 4.2 5.0  CL 104 98  CO2 21 20  GLUCOSE 196* 121*  BUN 45* 55*  CREATININE 1.75* 2.22*  CALCIUM 7.9* 8.3*   CMET CMP     Component Value Date/Time   NA 136* 08/26/2013 0220   K 5.0 08/26/2013 0220   CL 98 08/26/2013 0220   CO2 20 08/26/2013 0220   GLUCOSE 121* 08/26/2013 0220   BUN 55* 08/26/2013 0220   CREATININE 2.22* 08/26/2013 0220   CALCIUM 8.3* 08/26/2013 0220   PROT 5.0* 08/25/2013 0525   ALBUMIN 1.8* 08/25/2013 0525   AST 23 08/25/2013 0525   ALT 19 08/25/2013 0525   ALKPHOS 51 08/25/2013 0525   BILITOT <0.2* 08/25/2013 0525   GFRNONAA 21* 08/26/2013 0220   GFRAA 24* 08/26/2013 0220    CBG (last 3)   Recent Labs  08/26/13 1924 08/26/13 2329 08/27/13 0327  GLUCAP 197* 146* 115*    INR RESULTS:   Lab Results  Component Value Date   INR 4.69* 08/26/2013   INR 2.86* 08/25/2013   INR 2.14* 2013-10-27     Studies/Results: Dg Chest Port 1 View  08/27/2013   CLINICAL DATA:  short of breath  EXAM:  PORTABLE CHEST - 1 VIEW  COMPARISON:  In 07/1929 2015  FINDINGS: Diffuse bilateral airspace disease is present, with mild progression. Bilateral effusions and bibasilar atelectasis, unchanged.  IMPRESSION: Diffuse bilateral airspace disease suggestive of pulmonary edema with bilateral effusions. Mild progression of airspace disease.   Electronically Signed   By: Marlan Palauharles  Clark M.D.   On: 08/27/2013 08:07   Dg Swallowing Func-speech Pathology  08/25/2013   Riley NearingBonnie Caroline Deblois, CCC-SLP     08/25/2013  2:14 PM Objective Swallowing Evaluation: Modified Barium Swallowing Study   Patient Details  Name: Ariana AmatoShirley Weatherwax MRN: [id number] Date of Birth: 04-12-1941  Today's Date: 08/25/2013 Time: [id number] SLP Time Calculation (min): 24 min  Past Medical History:  Past Medical History  Diagnosis Date  . Pneumonia   . Pleural effusion   . CHF (congestive heart failure)   . Paroxysmal atrial fibrillation   . COPD (chronic obstructive pulmonary disease)   . Stroke   . Dysarthria due to cerebrovascular accident   . Hemiparesis affecting right side as late effect of  cerebrovascular accident   . Coronary artery disease   . Diabetes mellitus without complication   . GERD (gastroesophageal reflux disease)   . Dementia  Past Surgical History:  Past Surgical History  Procedure Laterality Date  . Thoracostomy    . Thoracentesis    . Breast surgery     HPI:  [name] presented 3/30 with a 2 day history of  progressive dyspnea, hypoxia & fever. Was hypotensive with some  response to fluids, lactate 1.7 and admitted for what was thought  to be severe sepsis. Pt with a history of CVA, dysarthria. Has  been seen by SLp for similar admissions, basic respiratory and  aspriation precautiosn suggested. No MBS on file.      Assessment / Plan / Recommendation Clinical Impression  Dysphagia Diagnosis: Mild pharyngeal phase dysphagia Clinical impression: Pt presents wia mild to moderate  oropharyngeal dysphagia with incomplete airway  closure during the  swallow, leading to consistent deep flash penetration of thin  liquids. Occasional aspiration or penetration events likely with  this presentation. Suspect airway closure is reduced due to high  respiratory need and pt struggle to keep airway closed for brief  apneic period during swallowing. SLP instructed pt in a chin  tuck, with a straw to ease of positioning, which reduced depth of  penetration, but did not prevent it completely. Hopefully this  strategy will reduce risk of possible aspiration events.  Recommend pt conitnue a dys 3 (mech soft) diet and thin liqudis,  but with a chin tuck. SLP will follow for carry over.     Treatment Recommendation  Therapy as outlined in treatment plan below    Diet Recommendation Dysphagia 3 (Mechanical Soft);Thin liquid   Liquid Administration via: Straw Medication Administration: Whole meds with puree Supervision: Patient able to self feed;Intermittent supervision  to cue for compensatory strategies Compensations: Slow rate;Small sips/bites Postural Changes and/or Swallow Maneuvers: Chin tuck;Seated  upright 90 degrees    Other  Recommendations Oral Care Recommendations: Oral care BID   Follow Up Recommendations  Skilled Nursing facility    Frequency and Duration min 2x/week  2 weeks   Pertinent Vitals/Pain NA    SLP Swallow Goals     General HPI: [name] presented 3/30 with a 2 day history  of progressive dyspnea, hypoxia & fever. Was hypotensive with  some response to fluids, lactate 1.7 and admitted for what was  thought to be severe sepsis. Pt with a history of CVA,  dysarthria. Has been seen by SLp for similar admissions, basic  respiratory and aspriation precautiosn suggested. No MBS on file.   Type of Study: Modified Barium Swallowing Study Reason for Referral: Objectively evaluate swallowing function Diet Prior to this Study: Dysphagia 3 (soft);Thin liquids Temperature Spikes Noted: No Respiratory Status: Nasal cannula History of Recent  Intubation: No Behavior/Cognition: Alert;Cooperative;Pleasant mood Oral Cavity - Dentition: Edentulous;Dentures, not available Oral Motor / Sensory Function: Impaired - see Bedside swallow  eval Self-Feeding Abilities: Able to feed self Patient Positioning: Upright in chair Baseline Vocal Quality: Low vocal intensity;Hoarse Volitional Cough: Congested Volitional Swallow: Able to elicit Anatomy: Within functional limits Pharyngeal Secretions: Not observed secondary MBS    Reason for Referral Objectively evaluate swallowing function   Oral Phase Oral Preparation/Oral Phase Oral Phase: Impaired Oral - Thin Oral - Thin Cup: Within functional limits Oral - Thin Straw: Within functional limits Oral - Solids Oral - Puree: Within functional limits Oral - Mechanical Soft: Impaired mastication;Weak lingual  manipulation;Pocketing in anterior sulcus;Right pocketing in  lateral sulci;Delayed oral transit (pt cleared independently)   Pharyngeal Phase Pharyngeal Phase Pharyngeal Phase: Impaired Pharyngeal - Thin Pharyngeal -  Thin Cup: Reduced airway/laryngeal  closure;Penetration/Aspiration during swallow Penetration/Aspiration details (thin cup): Material enters  airway, CONTACTS cords then ejected out Pharyngeal - Thin Straw: Reduced airway/laryngeal  closure;Penetration/Aspiration during swallow Penetration/Aspiration details (thin straw): Material enters  airway, CONTACTS cords then ejected out Pharyngeal - Solids Pharyngeal - Puree: Delayed swallow initiation;Premature spillage  to valleculae Pharyngeal - Mechanical Soft: Delayed swallow  initiation;Premature spillage to valleculae Pharyngeal - Pill: Delayed swallow initiation  Cervical Esophageal Phase    GO             Harlon Ditty, MA CCC-SLP [phone]  DeBlois, Riley Nearing 08/25/2013, 2:13 PM     Medications: I have reviewed the patient's current medications.  Assessment/Plan: #1 Dyspnea: worsened due to CHF from diastolic dysfunction, and tele shows  intermittent sinus rhythm. Her only chance of survival is if she can convert to a sinus rhythm given her LVH with normal systolic function, so will start amiodarone via PICC line, continue diuretics as tolerated and morphine for comfort. #2 DM2: stable on SSI   LOS: 3 days   Airon Sahni G 08/27/2013, 8:10 AM

## 2013-08-27 NOTE — Progress Notes (Signed)
Peripherally Inserted Central Catheter/Midline Placement  The IV Nurse has discussed with the patient and/or persons authorized to consent for the patient, the purpose of this procedure and the potential benefits and risks involved with this procedure.  The benefits include less needle sticks, lab draws from the catheter and patient may be discharged home with the catheter.  Risks include, but not limited to, infection, bleeding, blood clot (thrombus formation), and puncture of an artery; nerve damage and irregular heat beat.  Alternatives to this procedure were also discussed.  PICC/Midline Placement Documentation        [name], [name] 08/27/2013, 11:26 AM Consent obtained from son by staff RN.

## 2013-08-27 NOTE — Progress Notes (Signed)
CSW reviewed chart and noticed plan is for comfort care at this time. CSW continues to be available for support.   Maree KrabbeLindsay Danialle Dement, MSW, Theresia MajorsLCSWA [phone]

## 2013-08-28 LAB — GLUCOSE, CAPILLARY
GLUCOSE-CAPILLARY: 113 mg/dL — AB (ref 70–99)
GLUCOSE-CAPILLARY: 123 mg/dL — AB (ref 70–99)
Glucose-Capillary: 122 mg/dL — ABNORMAL HIGH (ref 70–99)
Glucose-Capillary: 125 mg/dL — ABNORMAL HIGH (ref 70–99)

## 2013-08-28 MED ORDER — ATROPINE SULFATE 1 % OP SOLN
2.0000 [drp] | Freq: Three times a day (TID) | OPHTHALMIC | Status: DC
  Administered 2013-08-28: 2 [drp] via SUBLINGUAL
  Filled 2013-08-28: qty 2

## 2013-08-28 MED ORDER — MORPHINE SULFATE (CONCENTRATE) 10 MG /0.5 ML PO SOLN
10.0000 mg | ORAL | Status: DC | PRN
  Administered 2013-08-28 (×2): 10 mg via ORAL
  Filled 2013-08-28 (×3): qty 0.5

## 2013-08-28 NOTE — Progress Notes (Signed)
Utilization review completed.  

## 2013-08-28 NOTE — Progress Notes (Signed)
Subjective: She is having moderate dyspnea despite facemask 100 % oxygen. No chest pain.  Objective: Vital signs in last 24 hours: Temp:  [96.6 F (35.9 C)-98.2 F (36.8 C)] 97.5 F (36.4 C) (04/03 0522) Pulse Rate:  [78-88] 87 (04/03 0522) Resp:  [8-22] 8 (04/03 0522) BP: (94-128)/(51-80) 128/76 mmHg (04/03 0522) SpO2:  [93 %-100 %] 98 % (04/03 0522) Weight:  [83.553 kg (184 lb 3.2 oz)] 83.553 kg (184 lb 3.2 oz) (04/02 2003) Weight change:    Intake/Output from previous day: 04/02 0701 - 04/03 0700 In: 120 [I.V.:120] Out: 830 [Urine:830]   General appearance: alert, cooperative and moderate distress Resp: bilateral scattered wheezing and rhonchi, using accessory muscles of respiration Cardio: regular rate and rhythm Extremities: bilateral 1+ leg edema  Lab Results: No results found for this basename: WBC, HGB, HCT, PLT,  in the last 72 hours BMET  Recent Labs  08/26/13 0220  NA 136*  K 5.0  CL 98  CO2 20  GLUCOSE 121*  BUN 55*  CREATININE 2.22*  CALCIUM 8.3*   CMET CMP     Component Value Date/Time   NA 136* 08/26/2013 0220   K 5.0 08/26/2013 0220   CL 98 08/26/2013 0220   CO2 20 08/26/2013 0220   GLUCOSE 121* 08/26/2013 0220   BUN 55* 08/26/2013 0220   CREATININE 2.22* 08/26/2013 0220   CALCIUM 8.3* 08/26/2013 0220   PROT 5.0* 08/25/2013 0525   ALBUMIN 1.8* 08/25/2013 0525   AST 23 08/25/2013 0525   ALT 19 08/25/2013 0525   ALKPHOS 51 08/25/2013 0525   BILITOT <0.2* 08/25/2013 0525   GFRNONAA 21* 08/26/2013 0220   GFRAA 24* 08/26/2013 0220    CBG (last 3)   Recent Labs  08/27/13 2038 08/28/13 0021 08/28/13 0520  GLUCAP 147* 125* 113*    INR RESULTS:   Lab Results  Component Value Date   INR 4.69* 08/26/2013   INR 2.86* 08/25/2013   INR 2.14* 08/19/2013     Studies/Results: Dg Chest Port 1 View  08/27/2013   CLINICAL DATA:  short of breath  EXAM: PORTABLE CHEST - 1 VIEW  COMPARISON:  In 07/1929 2015  FINDINGS: Diffuse bilateral airspace disease is present,  with mild progression. Bilateral effusions and bibasilar atelectasis, unchanged.  IMPRESSION: Diffuse bilateral airspace disease suggestive of pulmonary edema with bilateral effusions. Mild progression of airspace disease.   Electronically Signed   By: Marlan Palauharles  Clark M.D.   On: 08/27/2013 08:07    Medications: I have reviewed the patient's current medications.  Assessment/Plan: #1 Dyspnea: due to severe acute on chronic CHF from diastolic dysfunction in the face of capillary leak from hypoalbuminemia. I appreciate assistance from Dr. Kendrick FriesMcQuaid. Will add roxanol and atopine SL to improve dyspnea and decrease rhonchi. #2 DM2: stable #3 Acute Renal Insufficiency: clinically stable and no longer checking labs per patient and family request. Her son was in the room and is in agreement with comfort care.    LOS: 4 days   Kendrell Lottman G 08/28/2013, 8:16 AM

## 2013-08-28 NOTE — Progress Notes (Signed)
Nutrition Brief Note  RD drawn to chart 2/2 Low Braden Score. Chart reviewed. Pt now transitioning to comfort care.  No further nutrition interventions warranted at this time.  Please re-consult as needed.   Jarold MottoSamantha Donald Jacque MS, RD, LDN Inpatient Registered Dietitian Pager: [phone] After-hours pager: [phone]

## 2013-08-28 NOTE — Progress Notes (Addendum)
Spoke with Zane, pt's son, regarding funeral home arrangements. [facility]   [facility] [address] [place], [place] [id number] [phone]  Chaplain also called upon family's request.

## 2013-08-30 LAB — CULTURE, BLOOD (ROUTINE X 2)
CULTURE: NO GROWTH
CULTURE: NO GROWTH

## 2013-09-03 NOTE — ED Provider Notes (Signed)
Medical screening examination/treatment/procedure(s) were conducted as a shared visit with non-physician practitioner(s) and myself.  I personally evaluated the patient during the encounter.   EKG Interpretation   Date/Time:  Monday August 24 2013 09:09:19 EDT Ventricular Rate:  89 PR Interval:  61 QRS Duration: 138 QT Interval:  416 QTC Calculation: 506 R Axis:   -69 Text Interpretation:  Atrial fibrillation Multiple premature complexes,  vent  Short PR interval RBBB and LAFB Probable LVH with secondary repol  abnrm Probable posterior infarct, acute No significant change since last  tracing Confirmed by [facility]  MD, Adabelle Griffiths ([id number]) on 26-Jan-2014 9:17:36 AM        Ariana SproutWhitney Manuel Lawhead, MD 09/03/13 1520

## 2013-09-11 NOTE — Discharge Summary (Addendum)
Physician Discharge Summary  Patient ID: Ariana [name] MRN: [id number] DOB/AGE: 01/27/1941 73 y.o.  Admit date: 09/21/13 Date of Death: Sep 26, 2013   Discharge Diagnoses:  Principal Problem:   Sepsis Active Problems:   Pneumonia   Recurrent pleural effusion on right   Paroxysmal atrial fibrillation   Acute respiratory failure with hypoxia   Anemia   CKD (chronic kidney disease), stage III   Diabetes mellitus   Discharged Condition: died during hospitalization  Hospital Course: The patient is a 73 year old Caucasian woman who is well-known to me as I service as her attending physician at her skilled nursing facility. She has had several hospital admissions because of acute worsening of chronic dyspnea. 2 days ago I saw her at the skilled nursing facility with increased dyspnea, so her prednisone dosing, albuterol nebulizers, and diuretic treatment was increased. Regardless of this, her dyspnea progressed and became associated with fever and chills this morning. At the skilled nursing facility her pulse oxygen saturation level was in the 80s on nasal cannula oxygen at 2 L per minute and increased to the high 80s at 4 L per minute with significant dyspnea and fever, so she was transported to the emergency room for evaluation. In the emergency room her blood pressure systolic was initially in the 78-29 range with fever, and this improved with IV fluids and pulse dose corticosteroids. She has been given broad-spectrum antibiotics by the emergency room attending physician. She feels slightly better than at the time of admission, but still has moderate dyspnea despite supplemental oxygen. She has had a mildly productive cough as well. She has not had significant substernal chest pain. She also denies having problems with coughing with eating or drinking fluids. During her last hospital admission she had a moderate right pleural effusion with therapeutic thoracentesis done. She continues to maintain  that she would like a DO NOT RESUSCITATE order here.   She was admitted to a stepdown bed and was treated with high-dose IV diuretics  And vasopressor agents as well as high-dose oxygen therapy.  She also had an echocardiogram done that showed normal left ventricular systolic function with diastolic dysfunction.  She developed bilateral pleural effusions and bilateral airspace disease that worsen despite above treatment.  She was noted to be in atrial fibrillation.  She was seen by a critical care medicine consultant as well who made recommendations in medication dosing.  She and her son opted against aggressive treatment with PICC line placement, IV amiodarone, and possible transesophageal echocardiogram guided cardioversion.  She transition to comfort care measures alone and died peacefully in the presence of her son.  An autopsy was not requested.  Consults: pulmonary/intensive care  Significant Diagnostic Studies:  No results found.  Labs: Lab Results  Component Value Date   WBC 10.7* 08/25/2013   HGB 8.1* 08/25/2013   HCT 27.6* 08/25/2013   MCV 76.5* 08/25/2013   PLT 201 08/25/2013    No results found for this basename: NA, K, CL, CO2, BUN, CREATININE, CALCIUM, LABALBU, PROT, BILITOT, ALKPHOS, ALT, AST, GLUCOSE,  in the last 168 hours     Lab Results  Component Value Date   INR 4.69* 08/26/2013   INR 2.86* 08/25/2013   INR 2.14* 09/21/2013     No results found for this or any previous visit (from the past 240 hour(s)).    Discharge Exam: Blood pressure 110/70, pulse 86, temperature 98 F (36.7 C), temperature source Axillary, resp. rate 12, height 5\' 8"  (1.727 m), weight 83.553 kg (184 lb  3.2 oz), SpO2 95.00%.  Physical Exam: She died peacefully in the presence of her son  With the use of IV morphine, benzodiazepines, and supplemental oxygen.  Disposition:  She was transferred to the morgue with funeral details arrange by her family.     Medication List    ASK your doctor about  these medications       acetaminophen 325 MG tablet  Commonly known as:  TYLENOL  Take 650 mg by mouth every 4 (four) hours as needed (for pain).     albuterol (2.5 MG/3ML) 0.083% nebulizer solution  Commonly known as:  PROVENTIL  Take 2.5 mg by nebulization every 6 (six) hours as needed for wheezing or shortness of breath.     albuterol (2.5 MG/3ML) 0.083% nebulizer solution  Commonly known as:  PROVENTIL  Take 3 mLs (2.5 mg total) by nebulization 2 (two) times daily.     ASPIRIN ADULT LOW STRENGTH 81 MG chewable tablet  Generic drug:  aspirin  Chew 81 mg by mouth daily.     atorvastatin 20 MG tablet  Commonly known as:  LIPITOR  Take 20 mg by mouth at bedtime.     clonazePAM 1 MG tablet  Commonly known as:  KLONOPIN  Take 1 mg by mouth 2 (two) times daily.     clonazePAM 0.5 MG tablet  Commonly known as:  KLONOPIN  Take 1 tablet (0.5 mg total) by mouth every morning.     docusate sodium 100 MG capsule  Commonly known as:  COLACE  Take 100 mg by mouth 2 (two) times daily.     famotidine 20 MG tablet  Commonly known as:  PEPCID  Take 20 mg by mouth 2 (two) times daily.     furosemide 80 MG tablet  Commonly known as:  LASIX  Take 80 mg by mouth 2 (two) times daily.     HYDROcodone-acetaminophen 5-325 MG per tablet  Commonly known as:  NORCO/VICODIN  Take 1 tablet by mouth 3 (three) times daily as needed for moderate pain.     insulin aspart 100 UNIT/ML injection  Commonly known as:  novoLOG  Inject 0-9 Units into the skin 3 (three) times daily before meals. <120= no insulin, 121-150= 1 unit, 151-200=2 units, 201-250= 3 units 251-300= 5 units, 301-350= 7 units, 351-400= 9 units, >400= call md     insulin detemir 100 UNIT/ML injection  Commonly known as:  LEVEMIR  Inject 7 Units into the skin 2 (two) times daily.     metoprolol succinate 25 MG 24 hr tablet  Commonly known as:  TOPROL-XL  Take 25 mg by mouth 2 (two) times daily.     multivitamins ther. w/minerals  Tabs tablet  Take 1 tablet by mouth daily.     nitroGLYCERIN 0.2 mg/hr patch  Commonly known as:  NITRODUR - Dosed in mg/24 hr  Place 0.2 mg onto the skin daily.     NU-IRON 150 MG capsule  Generic drug:  iron polysaccharides  Take 150 mg by mouth daily.     oseltamivir 75 MG capsule  Commonly known as:  TAMIFLU  Take 75 mg by mouth daily.     predniSONE 10 MG tablet  Commonly known as:  DELTASONE  Take 60 mg by mouth daily with breakfast.     QUEtiapine 100 MG tablet  Commonly known as:  SEROQUEL  Take 100 mg by mouth at bedtime.     senna-docusate 8.6-50 MG per tablet  Commonly known as:  Senokot-S  Take 1 tablet by mouth at bedtime.     spironolactone 25 MG tablet  Commonly known as:  ALDACTONE  Take 1 tablet (25 mg total) by mouth daily.     tiotropium 18 MCG inhalation capsule  Commonly known as:  SPIRIVA  Place 1 capsule (18 mcg total) into inhaler and inhale daily.     traZODone 50 MG tablet  Commonly known as:  DESYREL  Take 50 mg by mouth at bedtime.     venlafaxine XR 75 MG 24 hr capsule  Commonly known as:  EFFEXOR-XR  Take 75 mg by mouth daily.     warfarin 4 MG tablet  Commonly known as:  COUMADIN  Take 4 mg by mouth daily.         Signed: Jarome MatinDaniel Kathrynn Backstrom 09/11/2013, 6:02 PM

## 2013-09-25 NOTE — Progress Notes (Signed)
Patient without pulse, breath sounds, or movement. Daughter-in-law at bedside. Witnessed by J. Donell Sievertembrina RN and M. Customer service managerBirago RN. 0 mg morphine left in bag, wasted remainder of morphine left in tubing in the sink with M .Birago. Chaplain called for support. MD notified. Gilman Schmidtembrina, Lillyan Hitson J

## 2013-09-25 NOTE — Progress Notes (Signed)
Spoke with son and daughter in law as they were leaving the hospital.

## 2013-09-25 DEATH — deceased

## 2014-10-24 IMAGING — CR DG CHEST 1V PORT
1 series · 1 of 1 positions shown · non-contrast
Comparison: 06/02/2013 and earlier.

CLINICAL DATA: 72-year-old female shortness of Breath weakness and
confusion. Initial encounter.

EXAM:
PORTABLE CHEST - 1 VIEW

[AP]
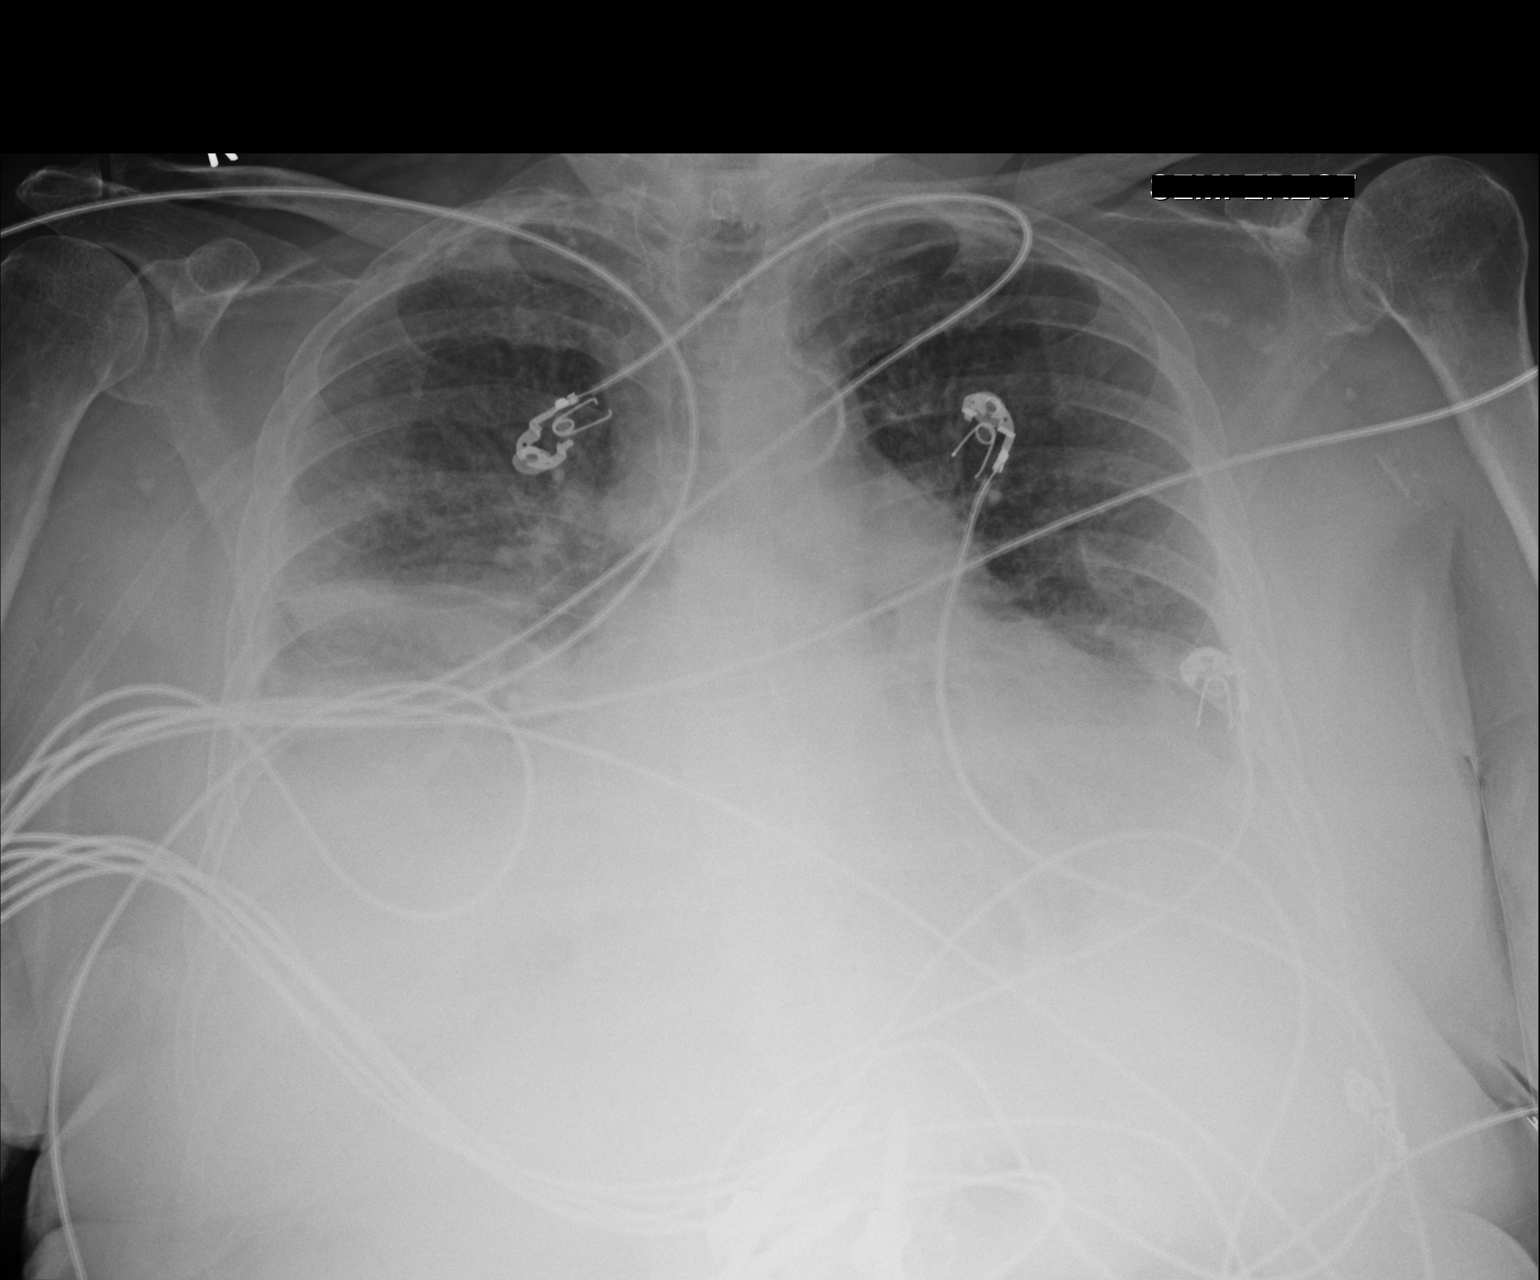

[1 of 1 positions shown; findings below may reference images not displayed]

FINDINGS: Portable AP semi upright view at 8293 hrs. Lower lung volumes.
Increased crowding of markings at both bases. No pneumothorax or
large effusion, but small effusions are suspected. Decreased
pulmonary vascularity without overt edema. Stable cardiac size and
mediastinal contours. Partially visible lumbar fusion hardware.
IMPRESSION: 1. Lower lung volumes with increased bibasilar opacity, favor
atelectasis.
2. Small bilateral pleural effusions are suspected. No overt
pulmonary edema.

## 2014-11-01 IMAGING — CR DG CHEST 2V
2 series · 2 of 2 positions shown · non-contrast
Comparison: 06/14/2013 and 06/12/2013

CLINICAL DATA: Shortness of breath, recurrent pleural effusions.

EXAM:
CHEST  2 VIEW

[w chest lat]
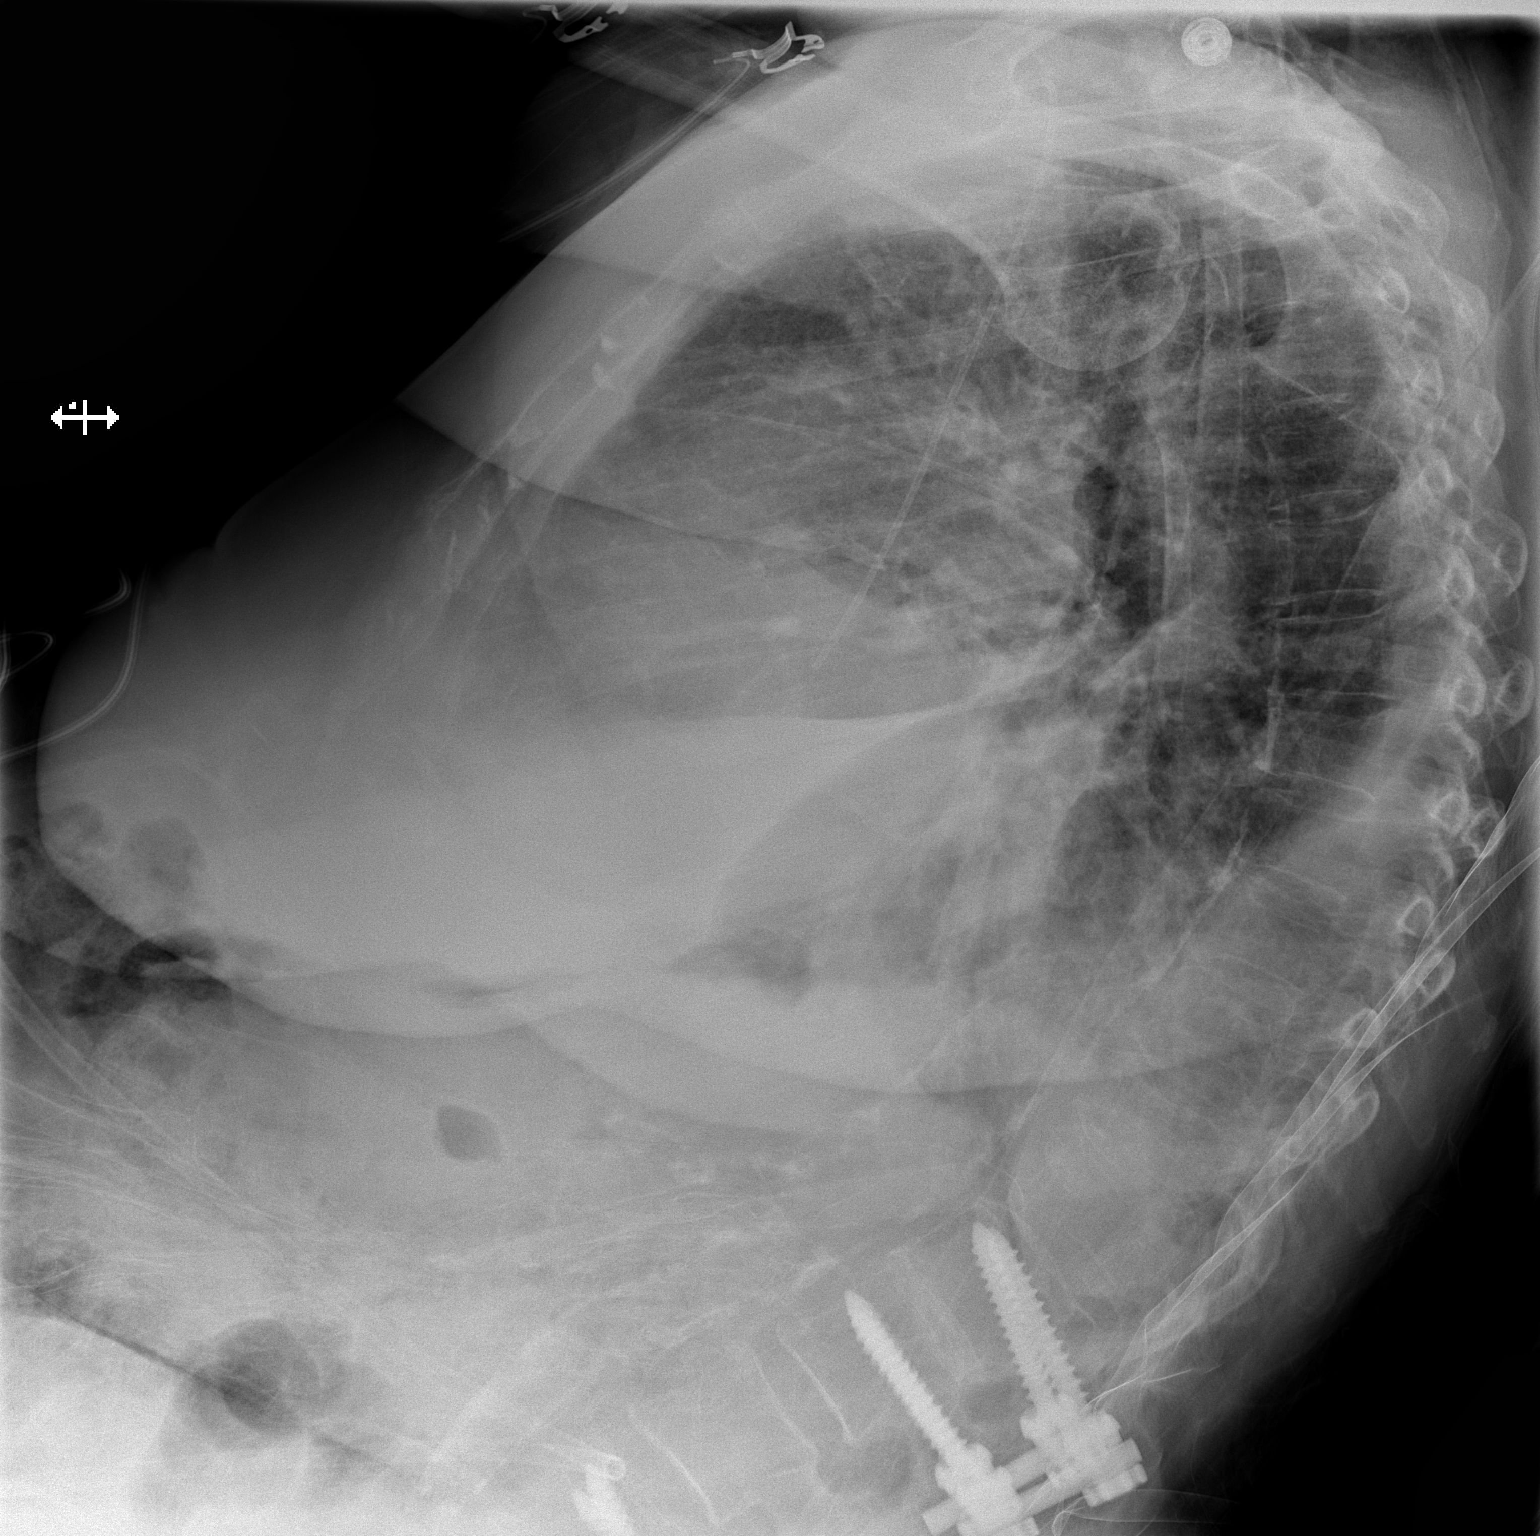

[x chest ap]
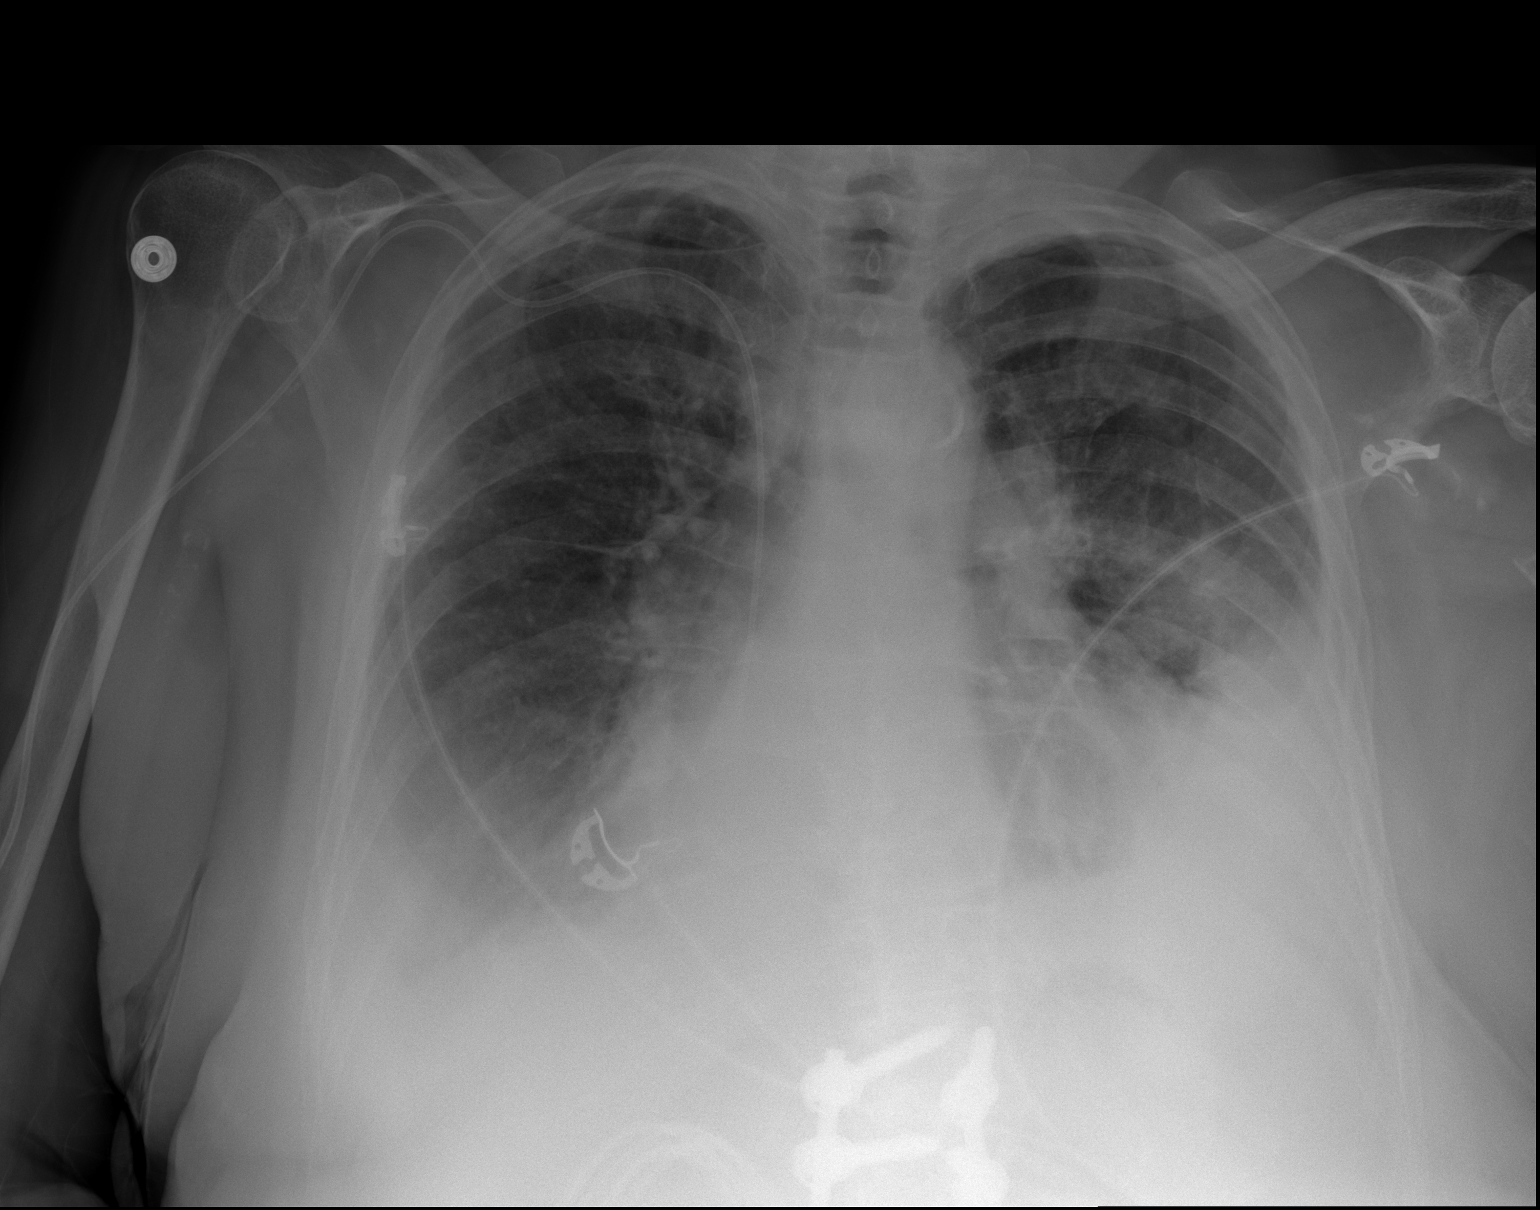

[2 of 2 positions shown; findings below may reference images not displayed]

FINDINGS: Right-sided PICC line is unchanged. Lungs are somewhat hypoinflated
with worsening small to moderate pleural effusions left greater than
right. There is worsening opacification over the left midlung which
may be due to atelectasis or infection. There is stable
cardiomegaly. There is calcification of the thoracoabdominal aorta.
Posterior she fusion hardware is present over the upper lumbar spine
unchanged. Mild anterior wedging of a lower thoracic vertebral body
unchanged. .
IMPRESSION: Small to moderate bilateral pleural effusions left greater than
right with slight worsening. Opacification of the left midlung
slightly worse which may be due to atelectasis or infection.

Right-sided PICC line unchanged.

Stable cardiomegaly.

## 2015-01-09 IMAGING — CR DG CHEST 1V PORT
1 series · 1 of 1 positions shown · non-contrast
Comparison: 08/17/2013

CLINICAL DATA: Shortness of breath, weakness, sepsis

EXAM:
PORTABLE CHEST - 1 VIEW

[AP]
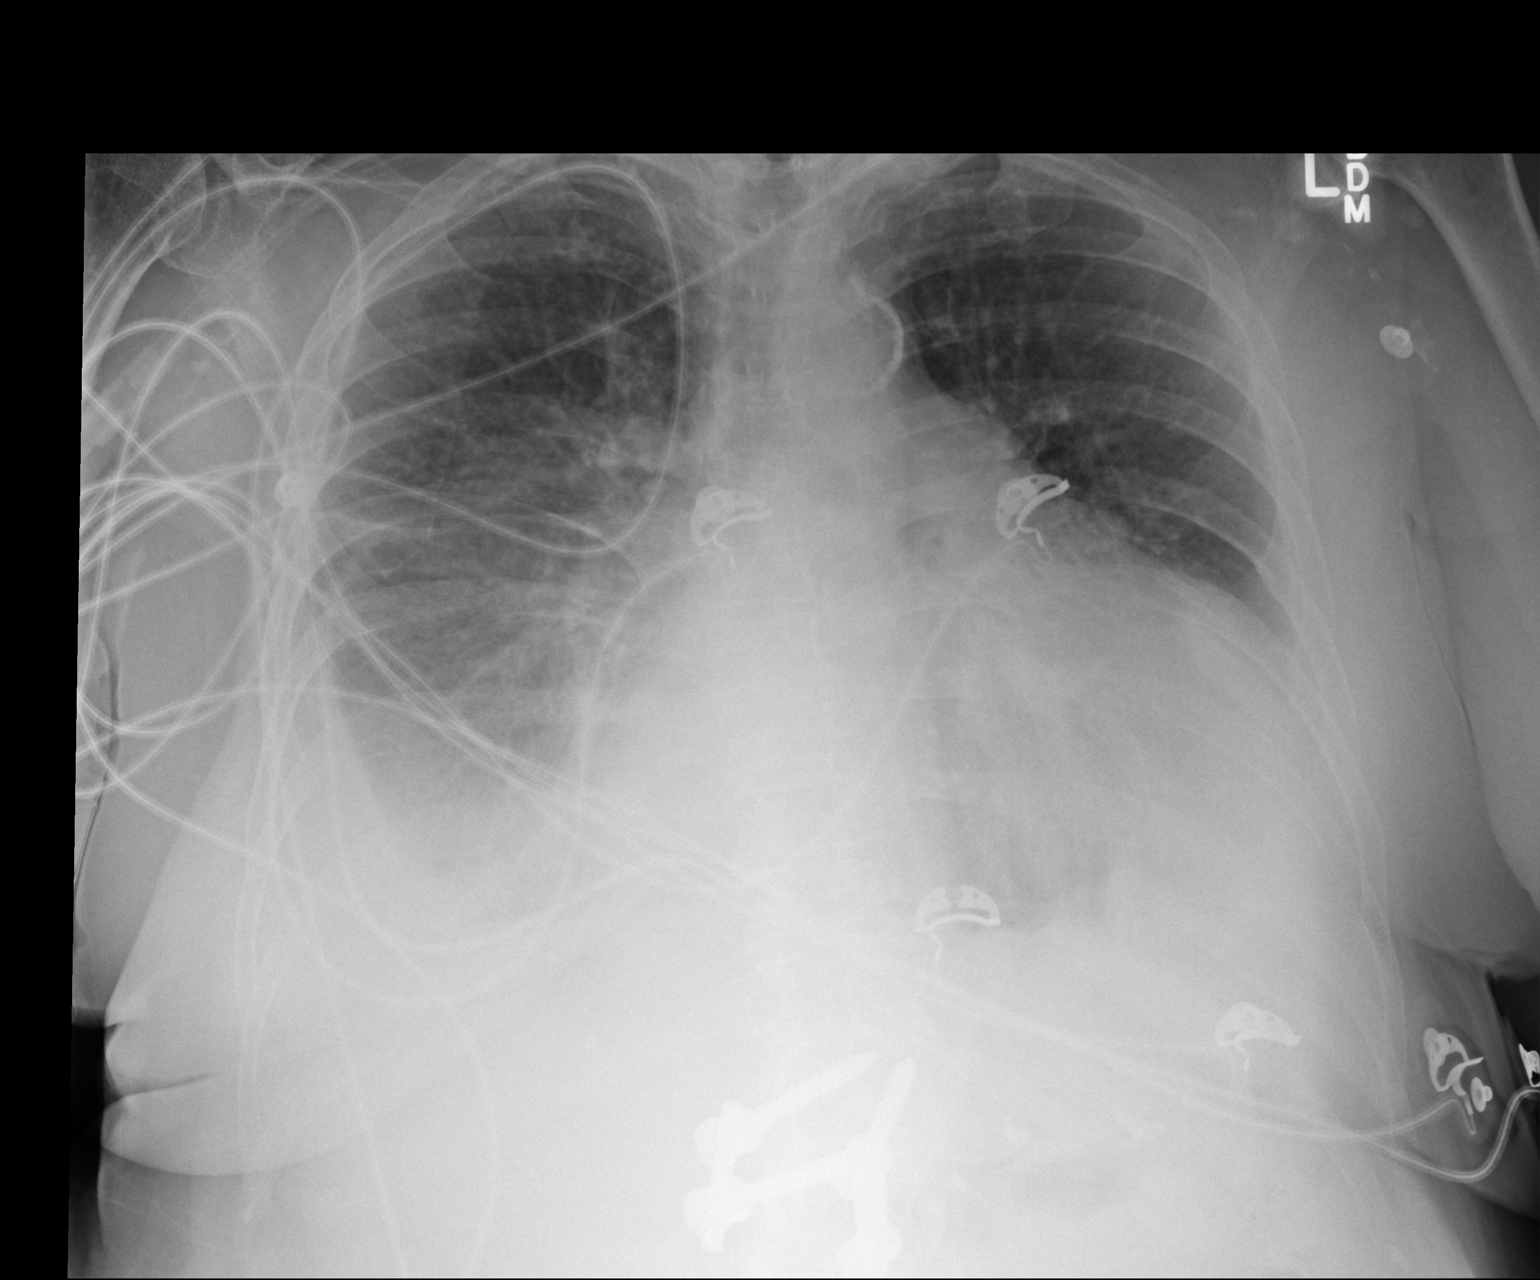

[1 of 1 positions shown; findings below may reference images not displayed]

FINDINGS: Cardiomegaly again noted. Central vascular congestion without
pulmonary edema. Small to moderate right pleural effusion. Small
left pleural effusion. Bilateral basilar atelectasis or infiltrate.
IMPRESSION: Central vascular congestion without pulmonary edema. Small to
moderate right pleural effusion with right basilar atelectasis or
infiltrate. Small left pleural effusion with left basilar
atelectasis or infiltrate.

## 2015-01-10 IMAGING — US US CHEST/MEDIASTINUM
1 series · 4 of 4 positions shown · non-contrast
Comparison: 08/17/2013

CLINICAL DATA: Evaluate for pleural effusion.

EXAM:
CHEST ULTRASOUND

[Series 1: us chest/mediastinum · 0.27mm/px · 4 of 4 slices shown]
[im 1/4]
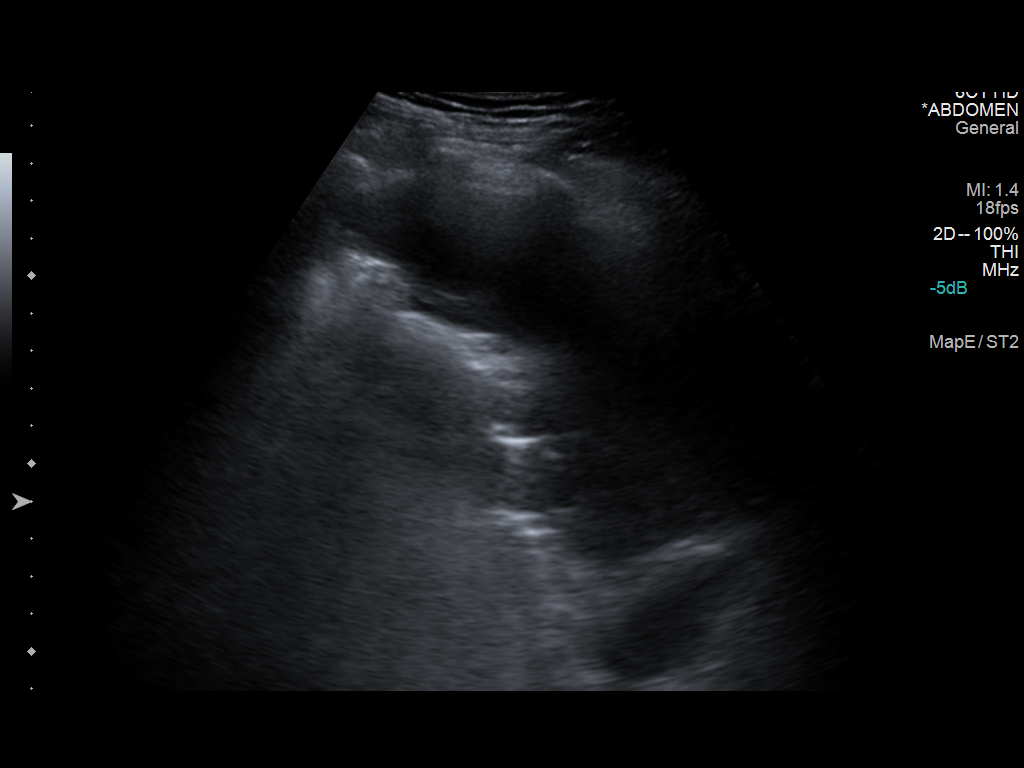
[im 2/4]
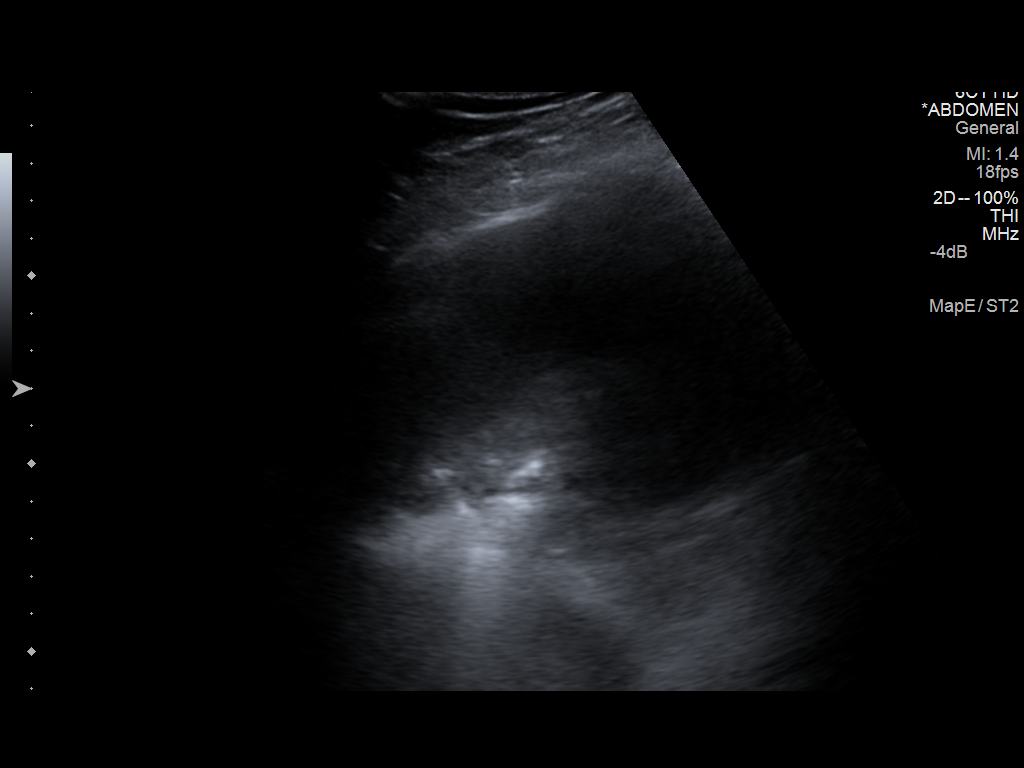
[im 3/4]
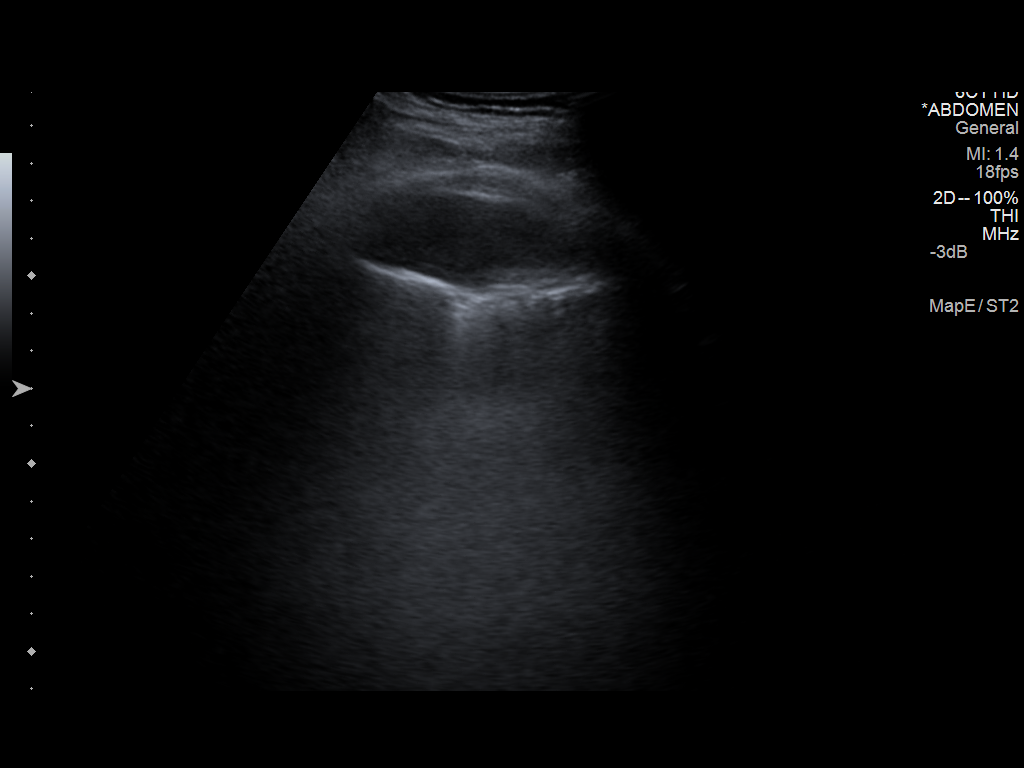
[im 4/4]
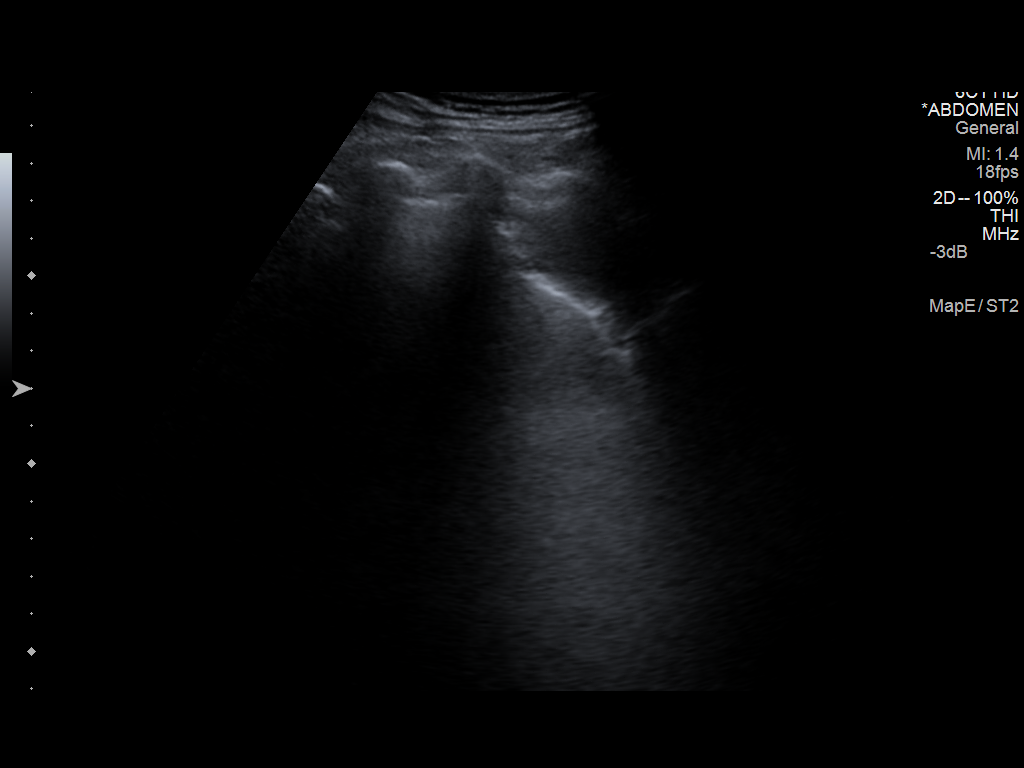

[4 of 4 positions shown; findings below may reference images not displayed]

FINDINGS: Bilateral pleural effusions without visible septation or debris. The
effusion on the right is moderate volume when correlated with chest
x-ray. The effusion on the left is small volume. No visible
loculation. Extensive atelectasis of the right lower lobe.
IMPRESSION: Moderate right and small left pleural effusion. No visible septation
or other complex feature.
